# Patient Record
Sex: Female | Born: 1940
Health system: Southern US, Community
[De-identification: ages and names within clinical notes are randomized; demographics above are authoritative.]

## PROBLEM LIST (undated history)

## (undated) DIAGNOSIS — E785 Hyperlipidemia, unspecified: Secondary | ICD-10-CM

## (undated) DIAGNOSIS — Z8679 Personal history of other diseases of the circulatory system: Secondary | ICD-10-CM

## (undated) DIAGNOSIS — I10 Essential (primary) hypertension: Secondary | ICD-10-CM

## (undated) DIAGNOSIS — M810 Age-related osteoporosis without current pathological fracture: Secondary | ICD-10-CM

## (undated) DIAGNOSIS — K8020 Calculus of gallbladder without cholecystitis without obstruction: Secondary | ICD-10-CM

## (undated) DIAGNOSIS — C801 Malignant (primary) neoplasm, unspecified: Secondary | ICD-10-CM

## (undated) DIAGNOSIS — K5730 Diverticulosis of large intestine without perforation or abscess without bleeding: Secondary | ICD-10-CM

## (undated) DIAGNOSIS — Z85828 Personal history of other malignant neoplasm of skin: Secondary | ICD-10-CM

## (undated) DIAGNOSIS — C4490 Unspecified malignant neoplasm of skin, unspecified: Secondary | ICD-10-CM

## (undated) DIAGNOSIS — K219 Gastro-esophageal reflux disease without esophagitis: Secondary | ICD-10-CM

## (undated) DIAGNOSIS — IMO0001 Reserved for inherently not codable concepts without codable children: Secondary | ICD-10-CM

## (undated) DIAGNOSIS — M1990 Unspecified osteoarthritis, unspecified site: Secondary | ICD-10-CM

## (undated) DIAGNOSIS — Z9889 Other specified postprocedural states: Secondary | ICD-10-CM

## (undated) DIAGNOSIS — E041 Nontoxic single thyroid nodule: Secondary | ICD-10-CM

## (undated) HISTORY — DX: Essential (primary) hypertension: I10

## (undated) HISTORY — DX: Unspecified malignant neoplasm of skin, unspecified: C44.90

## (undated) HISTORY — DX: Gastro-esophageal reflux disease without esophagitis: K21.9

## (undated) HISTORY — DX: Calculus of gallbladder without cholecystitis without obstruction: K80.20

## (undated) HISTORY — DX: Unspecified osteoarthritis, unspecified site: M19.90

## (undated) HISTORY — DX: Reserved for inherently not codable concepts without codable children: IMO0001

## (undated) HISTORY — DX: Diverticulosis of large intestine without perforation or abscess without bleeding: K57.30

## (undated) HISTORY — PX: FOOT SURGERY: SHX648

## (undated) HISTORY — DX: Personal history of other diseases of the circulatory system: Z86.79

## (undated) HISTORY — DX: Hyperlipidemia, unspecified: E78.5

## (undated) HISTORY — PX: MOHS SURGERY: SUR867

## (undated) HISTORY — DX: Personal history of other malignant neoplasm of skin: Z85.828

## (undated) HISTORY — DX: Other specified postprocedural states: Z98.890

## (undated) HISTORY — DX: Age-related osteoporosis without current pathological fracture: M81.0

## (undated) HISTORY — DX: Malignant (primary) neoplasm, unspecified: C80.1

---

## 1982-06-27 HISTORY — PX: VARICOSE VEIN SURGERY: SHX832

## 1999-06-28 HISTORY — PX: CHOLECYSTECTOMY: SHX55

## 1999-10-20 ENCOUNTER — Encounter: Payer: Self-pay | Admitting: Internal Medicine

## 2002-06-27 HISTORY — PX: VARICOSE VEIN SURGERY: SHX832

## 2004-01-26 ENCOUNTER — Encounter: Payer: Self-pay | Admitting: Family Medicine

## 2004-01-26 LAB — CONVERTED CEMR LAB

## 2004-02-06 ENCOUNTER — Encounter: Admission: RE | Admit: 2004-02-06 | Discharge: 2004-02-06 | Payer: Self-pay | Admitting: Internal Medicine

## 2004-04-27 LAB — HM COLONOSCOPY

## 2004-05-12 ENCOUNTER — Encounter: Payer: Self-pay | Admitting: Internal Medicine

## 2004-05-12 ENCOUNTER — Ambulatory Visit (HOSPITAL_COMMUNITY): Admission: RE | Admit: 2004-05-12 | Discharge: 2004-05-12 | Payer: Self-pay | Admitting: Gastroenterology

## 2005-03-27 LAB — FECAL OCCULT BLOOD, GUAIAC

## 2005-09-12 ENCOUNTER — Encounter: Admission: RE | Admit: 2005-09-12 | Discharge: 2005-09-12 | Payer: Self-pay | Admitting: Internal Medicine

## 2006-01-25 ENCOUNTER — Ambulatory Visit: Payer: Self-pay | Admitting: Internal Medicine

## 2006-01-25 DIAGNOSIS — IMO0001 Reserved for inherently not codable concepts without codable children: Secondary | ICD-10-CM

## 2006-01-25 HISTORY — DX: Reserved for inherently not codable concepts without codable children: IMO0001

## 2006-01-27 ENCOUNTER — Ambulatory Visit: Payer: Self-pay

## 2006-01-27 ENCOUNTER — Ambulatory Visit: Payer: Self-pay | Admitting: Internal Medicine

## 2006-01-27 ENCOUNTER — Encounter: Payer: Self-pay | Admitting: Cardiology

## 2006-03-01 ENCOUNTER — Ambulatory Visit: Payer: Self-pay | Admitting: Internal Medicine

## 2006-04-07 ENCOUNTER — Ambulatory Visit: Payer: Self-pay | Admitting: Internal Medicine

## 2006-07-30 DIAGNOSIS — Z8679 Personal history of other diseases of the circulatory system: Secondary | ICD-10-CM

## 2006-07-30 HISTORY — DX: Personal history of other diseases of the circulatory system: Z86.79

## 2006-08-31 ENCOUNTER — Ambulatory Visit: Payer: Self-pay | Admitting: Internal Medicine

## 2006-08-31 LAB — CONVERTED CEMR LAB
Albumin: 3.7 g/dL (ref 3.5–5.2)
BUN: 14 mg/dL (ref 6–23)
CO2: 29 meq/L (ref 19–32)
Calcium: 9.3 mg/dL (ref 8.4–10.5)
Chloride: 108 meq/L (ref 96–112)
Cholesterol: 182 mg/dL (ref 0–200)
Creatinine, Ser: 1 mg/dL (ref 0.4–1.2)
GFR calc Af Amer: 72 mL/min
GFR calc non Af Amer: 59 mL/min
Glucose, Bld: 83 mg/dL (ref 70–99)
HDL: 43.7 mg/dL (ref >39.0)
LDL Cholesterol: 123 mg/dL — ABNORMAL HIGH (ref 0–99)
Phosphorus: 4.3 mg/dL (ref 2.3–4.6)
Potassium: 3.7 meq/L (ref 3.5–5.1)
Sodium: 144 meq/L (ref 135–145)
Total CHOL/HDL Ratio: 4.2
Triglycerides: 75 mg/dL (ref 0–149)
VLDL: 15 mg/dL (ref 0–40)

## 2006-09-14 ENCOUNTER — Encounter: Admission: RE | Admit: 2006-09-14 | Discharge: 2006-09-14 | Payer: Self-pay | Admitting: Internal Medicine

## 2006-11-09 ENCOUNTER — Encounter: Payer: Self-pay | Admitting: Internal Medicine

## 2007-02-27 ENCOUNTER — Encounter: Payer: Self-pay | Admitting: Family Medicine

## 2007-02-27 DIAGNOSIS — Z85828 Personal history of other malignant neoplasm of skin: Secondary | ICD-10-CM | POA: Insufficient documentation

## 2007-02-27 DIAGNOSIS — K5730 Diverticulosis of large intestine without perforation or abscess without bleeding: Secondary | ICD-10-CM | POA: Insufficient documentation

## 2007-02-27 DIAGNOSIS — M159 Polyosteoarthritis, unspecified: Secondary | ICD-10-CM | POA: Insufficient documentation

## 2007-02-27 DIAGNOSIS — I10 Essential (primary) hypertension: Secondary | ICD-10-CM | POA: Insufficient documentation

## 2007-03-05 ENCOUNTER — Ambulatory Visit: Payer: Self-pay | Admitting: Family Medicine

## 2007-03-05 ENCOUNTER — Other Ambulatory Visit: Admission: RE | Admit: 2007-03-05 | Discharge: 2007-03-05 | Payer: Self-pay | Admitting: Family Medicine

## 2007-03-05 ENCOUNTER — Encounter: Payer: Self-pay | Admitting: Family Medicine

## 2007-03-05 LAB — CONVERTED CEMR LAB: Pap Smear: NORMAL

## 2007-03-08 ENCOUNTER — Encounter (INDEPENDENT_AMBULATORY_CARE_PROVIDER_SITE_OTHER): Payer: Self-pay | Admitting: *Deleted

## 2007-09-17 ENCOUNTER — Encounter: Admission: RE | Admit: 2007-09-17 | Discharge: 2007-09-17 | Payer: Self-pay | Admitting: Internal Medicine

## 2007-09-19 ENCOUNTER — Encounter (INDEPENDENT_AMBULATORY_CARE_PROVIDER_SITE_OTHER): Payer: Self-pay | Admitting: *Deleted

## 2008-01-14 ENCOUNTER — Telehealth (INDEPENDENT_AMBULATORY_CARE_PROVIDER_SITE_OTHER): Payer: Self-pay | Admitting: *Deleted

## 2008-01-14 ENCOUNTER — Ambulatory Visit: Payer: Self-pay | Admitting: Internal Medicine

## 2008-04-09 ENCOUNTER — Telehealth: Payer: Self-pay | Admitting: Internal Medicine

## 2008-04-28 ENCOUNTER — Ambulatory Visit: Payer: Self-pay | Admitting: Internal Medicine

## 2008-04-30 LAB — CONVERTED CEMR LAB
Albumin: 3.7 g/dL (ref 3.5–5.2)
BUN: 14 mg/dL (ref 6–23)
Basophils Absolute: 0.1 10*3/uL (ref 0.0–0.1)
Basophils Relative: 1.1 % (ref 0.0–3.0)
CO2: 30 meq/L (ref 19–32)
Calcium: 9.1 mg/dL (ref 8.4–10.5)
Chloride: 106 meq/L (ref 96–112)
Creatinine, Ser: 0.9 mg/dL (ref 0.4–1.2)
Eosinophils Absolute: 0.4 10*3/uL (ref 0.0–0.7)
Eosinophils Relative: 4.3 % (ref 0.0–5.0)
GFR calc Af Amer: 80 mL/min
GFR calc non Af Amer: 66 mL/min
Glucose, Bld: 67 mg/dL — ABNORMAL LOW (ref 70–99)
HCT: 39 % (ref 36.0–46.0)
Hemoglobin: 13.4 g/dL (ref 12.0–15.0)
Lymphocytes Relative: 36.2 % (ref 12.0–46.0)
MCHC: 34.4 g/dL (ref 30.0–36.0)
MCV: 92.4 fL (ref 78.0–100.0)
Monocytes Absolute: 0.8 10*3/uL (ref 0.1–1.0)
Monocytes Relative: 9.7 % (ref 3.0–12.0)
Neutro Abs: 4.1 10*3/uL (ref 1.4–7.7)
Neutrophils Relative %: 48.7 % (ref 43.0–77.0)
Phosphorus: 4 mg/dL (ref 2.3–4.6)
Platelets: 221 10*3/uL (ref 150–400)
Potassium: 4.1 meq/L (ref 3.5–5.1)
RBC: 4.22 M/uL (ref 3.87–5.11)
RDW: 11.5 % (ref 11.5–14.6)
Sodium: 141 meq/L (ref 135–145)
TSH: 2.58 microintl units/mL (ref 0.35–5.50)
WBC: 8.5 10*3/uL (ref 4.5–10.5)

## 2008-09-17 ENCOUNTER — Encounter: Admission: RE | Admit: 2008-09-17 | Discharge: 2008-09-17 | Payer: Self-pay | Admitting: Internal Medicine

## 2008-09-17 LAB — HM MAMMOGRAPHY: HM Mammogram: NEGATIVE

## 2008-09-18 ENCOUNTER — Encounter: Payer: Self-pay | Admitting: Internal Medicine

## 2008-10-17 ENCOUNTER — Ambulatory Visit: Payer: Self-pay | Admitting: Internal Medicine

## 2008-10-17 DIAGNOSIS — M2550 Pain in unspecified joint: Secondary | ICD-10-CM | POA: Insufficient documentation

## 2008-10-17 DIAGNOSIS — K219 Gastro-esophageal reflux disease without esophagitis: Secondary | ICD-10-CM | POA: Insufficient documentation

## 2008-11-11 ENCOUNTER — Encounter: Payer: Self-pay | Admitting: Internal Medicine

## 2008-12-25 HISTORY — PX: MELANOMA EXCISION: SHX5266

## 2009-01-12 ENCOUNTER — Encounter: Payer: Self-pay | Admitting: Internal Medicine

## 2009-01-19 ENCOUNTER — Ambulatory Visit: Payer: Self-pay | Admitting: Internal Medicine

## 2009-01-27 ENCOUNTER — Encounter: Payer: Self-pay | Admitting: Internal Medicine

## 2009-02-04 ENCOUNTER — Encounter: Payer: Self-pay | Admitting: Internal Medicine

## 2009-02-09 ENCOUNTER — Encounter: Payer: Self-pay | Admitting: Internal Medicine

## 2009-05-20 ENCOUNTER — Ambulatory Visit: Payer: Self-pay | Admitting: Family Medicine

## 2009-06-01 ENCOUNTER — Ambulatory Visit: Payer: Self-pay | Admitting: Internal Medicine

## 2009-07-23 ENCOUNTER — Ambulatory Visit: Payer: Self-pay | Admitting: Internal Medicine

## 2009-07-23 DIAGNOSIS — R079 Chest pain, unspecified: Secondary | ICD-10-CM | POA: Insufficient documentation

## 2009-09-07 ENCOUNTER — Telehealth: Payer: Self-pay | Admitting: Internal Medicine

## 2009-11-06 ENCOUNTER — Ambulatory Visit: Payer: Self-pay | Admitting: Internal Medicine

## 2009-11-06 DIAGNOSIS — L255 Unspecified contact dermatitis due to plants, except food: Secondary | ICD-10-CM | POA: Insufficient documentation

## 2010-01-22 ENCOUNTER — Ambulatory Visit: Payer: Self-pay | Admitting: Internal Medicine

## 2010-02-02 ENCOUNTER — Telehealth: Payer: Self-pay | Admitting: Internal Medicine

## 2010-02-08 ENCOUNTER — Encounter: Payer: Self-pay | Admitting: Internal Medicine

## 2010-02-15 ENCOUNTER — Telehealth: Payer: Self-pay | Admitting: Internal Medicine

## 2010-07-12 ENCOUNTER — Other Ambulatory Visit: Payer: Self-pay | Admitting: Internal Medicine

## 2010-07-12 ENCOUNTER — Ambulatory Visit
Admission: RE | Admit: 2010-07-12 | Discharge: 2010-07-12 | Payer: Self-pay | Source: Home / Self Care | Attending: Internal Medicine | Admitting: Internal Medicine

## 2010-07-12 LAB — CBC WITH DIFFERENTIAL/PLATELET
Basophils Absolute: 0.1 10*3/uL (ref 0.0–0.1)
Basophils Relative: 0.8 % (ref 0.0–3.0)
Eosinophils Absolute: 0.3 10*3/uL (ref 0.0–0.7)
Eosinophils Relative: 3.9 % (ref 0.0–5.0)
HCT: 42.4 % (ref 36.0–46.0)
Hemoglobin: 14.3 g/dL (ref 12.0–15.0)
Lymphocytes Relative: 39.7 % (ref 12.0–46.0)
Lymphs Abs: 2.8 10*3/uL (ref 0.7–4.0)
MCHC: 33.8 g/dL (ref 30.0–36.0)
MCV: 92.8 fl (ref 78.0–100.0)
Monocytes Absolute: 0.6 10*3/uL (ref 0.1–1.0)
Monocytes Relative: 8.5 % (ref 3.0–12.0)
Neutro Abs: 3.4 10*3/uL (ref 1.4–7.7)
Neutrophils Relative %: 47.1 % (ref 43.0–77.0)
Platelets: 250 10*3/uL (ref 150.0–400.0)
RBC: 4.57 Mil/uL (ref 3.87–5.11)
RDW: 11.9 % (ref 11.5–14.6)
WBC: 7.2 10*3/uL (ref 4.5–10.5)

## 2010-07-12 LAB — RENAL FUNCTION PANEL
Albumin: 4.3 g/dL (ref 3.5–5.2)
BUN: 17 mg/dL (ref 6–23)
CO2: 28 mEq/L (ref 19–32)
Calcium: 9.4 mg/dL (ref 8.4–10.5)
Chloride: 102 mEq/L (ref 96–112)
Creatinine, Ser: 0.9 mg/dL (ref 0.4–1.2)
GFR: 69.37 mL/min (ref >60.00)
Glucose, Bld: 85 mg/dL (ref 70–99)
Phosphorus: 3.9 mg/dL (ref 2.3–4.6)
Potassium: 4 mEq/L (ref 3.5–5.1)
Sodium: 139 mEq/L (ref 135–145)

## 2010-07-12 LAB — HEPATIC FUNCTION PANEL
ALT: 24 U/L (ref 0–35)
AST: 20 U/L (ref 0–37)
Albumin: 4.3 g/dL (ref 3.5–5.2)
Alkaline Phosphatase: 66 U/L (ref 39–117)
Bilirubin, Direct: 0.1 mg/dL (ref 0.0–0.3)
Total Bilirubin: 0.5 mg/dL (ref 0.3–1.2)
Total Protein: 6.9 g/dL (ref 6.0–8.3)

## 2010-07-12 LAB — TSH: TSH: 2.26 u[IU]/mL (ref 0.35–5.50)

## 2010-07-21 ENCOUNTER — Other Ambulatory Visit: Payer: Self-pay | Admitting: Dermatology

## 2010-07-25 LAB — CONVERTED CEMR LAB
ALT: 24 units/L (ref 0–35)
ALT: 26 units/L (ref 0–35)
AST: 22 units/L (ref 0–37)
AST: 23 units/L (ref 0–37)
Albumin: 3.8 g/dL (ref 3.5–5.2)
Albumin: 4 g/dL (ref 3.5–5.2)
Alkaline Phosphatase: 71 units/L (ref 39–117)
Alkaline Phosphatase: 71 units/L (ref 39–117)
BUN: 13 mg/dL (ref 6–23)
BUN: 17 mg/dL (ref 6–23)
Basophils Absolute: 0 10*3/uL (ref 0.0–0.1)
Basophils Absolute: 0.1 10*3/uL (ref 0.0–0.1)
Basophils Relative: 0.4 % (ref 0.0–3.0)
Basophils Relative: 1.1 % (ref 0.0–3.0)
Bilirubin, Direct: 0 mg/dL (ref 0.0–0.3)
Bilirubin, Direct: 0.1 mg/dL (ref 0.0–0.3)
CO2: 29 meq/L (ref 19–32)
CO2: 30 meq/L (ref 19–32)
Calcium: 9 mg/dL (ref 8.4–10.5)
Calcium: 9.1 mg/dL (ref 8.4–10.5)
Chloride: 106 meq/L (ref 96–112)
Chloride: 106 meq/L (ref 96–112)
Cholesterol: 192 mg/dL (ref 0–200)
Creatinine, Ser: 0.8 mg/dL (ref 0.4–1.2)
Creatinine, Ser: 0.9 mg/dL (ref 0.4–1.2)
Eosinophils Absolute: 0.3 10*3/uL (ref 0.0–0.7)
Eosinophils Absolute: 0.4 10*3/uL (ref 0.0–0.7)
Eosinophils Relative: 4 % (ref 0.0–5.0)
Eosinophils Relative: 4.2 % (ref 0.0–5.0)
GFR calc non Af Amer: 66.01 mL/min (ref >60)
Glucose, Bld: 113 mg/dL — ABNORMAL HIGH (ref 70–99)
Glucose, Bld: 95 mg/dL (ref 70–99)
HCT: 39.4 % (ref 36.0–46.0)
HCT: 40.6 % (ref 36.0–46.0)
HDL: 38.6 mg/dL — ABNORMAL LOW (ref >39.00)
Hemoglobin: 13.5 g/dL (ref 12.0–15.0)
Hemoglobin: 13.9 g/dL (ref 12.0–15.0)
LDL Cholesterol: 118 mg/dL — ABNORMAL HIGH (ref 0–99)
Lymphocytes Relative: 27 % (ref 12.0–46.0)
Lymphocytes Relative: 30.6 % (ref 12.0–46.0)
Lymphs Abs: 2.3 10*3/uL (ref 0.7–4.0)
Lymphs Abs: 2.5 10*3/uL (ref 0.7–4.0)
MCHC: 34.1 g/dL (ref 30.0–36.0)
MCHC: 34.2 g/dL (ref 30.0–36.0)
MCV: 91.2 fL (ref 78.0–100.0)
MCV: 91.8 fL (ref 78.0–100.0)
Monocytes Absolute: 0.4 10*3/uL (ref 0.1–1.0)
Monocytes Absolute: 0.8 10*3/uL (ref 0.1–1.0)
Monocytes Relative: 10.1 % (ref 3.0–12.0)
Monocytes Relative: 4.6 % (ref 3.0–12.0)
Neutro Abs: 4.2 10*3/uL (ref 1.4–7.7)
Neutro Abs: 5.7 10*3/uL (ref 1.4–7.7)
Neutrophils Relative %: 54.9 % (ref 43.0–77.0)
Neutrophils Relative %: 63.1 % (ref 43.0–77.0)
Phosphorus: 3.8 mg/dL (ref 2.3–4.6)
Phosphorus: 4 mg/dL (ref 2.3–4.6)
Platelets: 210 10*3/uL (ref 150.0–400.0)
Platelets: 225 10*3/uL (ref 150.0–400.0)
Potassium: 3.8 meq/L (ref 3.5–5.1)
Potassium: 3.9 meq/L (ref 3.5–5.1)
RBC: 4.32 M/uL (ref 3.87–5.11)
RBC: 4.43 M/uL (ref 3.87–5.11)
RDW: 11.7 % (ref 11.5–14.6)
RDW: 12.1 % (ref 11.5–14.6)
Sodium: 142 meq/L (ref 135–145)
Sodium: 142 meq/L (ref 135–145)
TSH: 1.65 microintl units/mL (ref 0.35–5.50)
TSH: 1.71 microintl units/mL (ref 0.35–5.50)
Total Bilirubin: 0.6 mg/dL (ref 0.3–1.2)
Total Bilirubin: 0.7 mg/dL (ref 0.3–1.2)
Total CHOL/HDL Ratio: 5
Total Protein: 6.3 g/dL (ref 6.0–8.3)
Total Protein: 6.7 g/dL (ref 6.0–8.3)
Triglycerides: 176 mg/dL — ABNORMAL HIGH (ref 0.0–149.0)
VLDL: 35.2 mg/dL (ref 0.0–40.0)
WBC: 7.6 10*3/uL (ref 4.5–10.5)
WBC: 9.1 10*3/uL (ref 4.5–10.5)

## 2010-07-28 NOTE — Progress Notes (Signed)
Summary: Rx Lisinopril  Phone Note Refill Request Call back at [phone] Message from:  [facility] on April 09, 2008 8:36 AM  Refills Requested: Medication #1:  LISINOPRIL 10 MG  TABS 1 by mouth once daily Received faxed form to sign and fax back.  Form is in your in box.   Method Requested: Fax to Mail Away Pharmacy Initial call taken by: Sydell Axon,  April 09, 2008 8:36 AM  Follow-up for Phone Call        Rx done needs appt within the next few months to review meds Follow-up by: Cindee Salt MD,  April 09, 2008 1:54 PM  Additional Follow-up for Phone Call Additional follow up Details #1::        Rx faxed to pharmacy.  Pt notified by telephone as instructed.  Pt stated that she will call back and schedule an appt. Additional Follow-up by: Sydell Axon,  April 09, 2008 3:06 PM      Prescriptions: LISINOPRIL 10 MG  TABS (LISINOPRIL) 1 by mouth once daily  #90 x 3   Entered by:   Sydell Axon   Authorized by:   Cindee Salt MD   Signed by:   Sydell Axon on 04/09/2008   Method used:   Handwritten   RxID:   [id number]

## 2010-07-28 NOTE — Progress Notes (Signed)
Summary: refill request for omeprazole  Phone Note Refill Request Message from:  Fax from Pharmacy  Refills Requested: Medication #1:  omeprazole 20 mg [facility] is on your desk, this is no longer on med list.  Initial call taken by: Lowella Petties CMA,  September 07, 2009 1:17 PM  Follow-up for Phone Call        taken off as ineffective Please check with her If she is back on it, okay to refill for 1 year Follow-up by: Cindee Salt MD,  September 07, 2009 1:31 PM  Additional Follow-up for Phone Call Additional follow up Details #1::        pt was switched on 07/23/2009 from omeprazole to protonix. left message on machine for patient to return call.  DeShannon Smith CMA Duncan Dull)  September 07, 2009 2:45 PM   left message on machine for patient to return call.  DeShannon Smith CMA (AAMA)  September 08, 2009 8:20 AM   pt called back and stated she is taking protonix.  Additional Follow-up by: Mervin Hack CMA Duncan Dull),  September 09, 2009 2:26 PM

## 2010-07-28 NOTE — Assessment & Plan Note (Signed)
Summary: 6 MONTH FOLLWO UP/[facility]   Vital Signs:  Patient profile:   70 year old female Weight:      125.13 pounds Temp:     98.3 degrees F oral Pulse rate:   60 / minute Pulse rhythm:   regular BP sitting:   142 / 80  (left arm) Cuff size:   regular  Vitals Entered By: Janee Morn CMA (January 22, 2010 10:35 AM) CC: 6 month follow up   History of Present Illness: Doing okay  Having pain in right big toe wonders about gout several months of symptoms Gets brief shooting spells and feels spasms  Monitors BP usual 140/80 or so No headaches No chest pain No SOB No regular exercise  Still with heartburn has been on omeprazole but still with symptoms  Having nodules on fingers New one on right 5th PIP is new hasn't generally used meds--occ tylenol  Allergies: 1)  ! * N-Saids 2)  ! Aspirin Buffered 3)  ! Codeine  Past History:  Past medical, surgical, family and social histories (including risk factors) reviewed for relevance to current acute and chronic problems.  Past Medical History: Reviewed history from 07/23/2009 and no changes required. Skin cancer, hx of Diverticulosis, colon Hypertension Osteoarthritis GERD  Past Surgical History: Reviewed history from 06/01/2009 and no changes required. 1984      Vein stripping bilat. 2001      Cholecystectomy 2004      Vein stripping, right foot 8/07        Echo/Stress Echo nl 5/98        Echo - fairly nl. 2-3/08     Sclerotherapy. - left leg Donia Ast) 7/10        Melanoma on leg  Family History: Reviewed history from 01/19/2009 and no changes required. Father: Died, ETOH, DM Mother: Died ETOH, colon CA. Had osteoporosis Siblings: 1 brother died of lung CA CV:  2 distant HBP: No sign. H/A DM:  (from ETOH) Colon CA:  Mom See Geneology Arthritis in aunts on both sides  Social History: Reviewed history from 02/27/2007 and no changes required. 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   Neck:  supple, no masses, no thyromegaly, no carotid bruits, and no cervical lymphadenopathy.   Lungs:  normal respiratory effort, no intercostal retractions, no accessory muscle use, and normal breath sounds.   Heart:  normal rate, regular rhythm, no murmur, and no gallop.   Abdomen:  soft, non-tender, and no masses.   Msk:  normal ROM and no joint tenderness.   No inflammation or sig tenderness at right 1st MTP (probably mechanical) nodules on some hand PIP/DIPs Pulses:  1+ in feet Extremities:  no edema Neurologic:  alert & oriented X3, strength normal in all extremities, and gait normal.   Skin:  no rashes and no suspicious lesions.   Psych:  normally interactive, good eye contact, not anxious appearing, and not depressed appearing.     Impression & Recommendations:  Problem # 1:  HYPERTENSION (ICD-401.9) Assessment Unchanged reasonable control no changes needed  Her updated medication list for this problem includes:    Lisinopril 10 Mg Tabs (Lisinopril) .Marland Kitchen... 1 by mouth once daily  BP today: 142/80 Prior BP: 129/78 (11/06/2009)  Prior 10 Yr Risk Heart Disease: 17 % (03/05/2007)  Labs Reviewed: K+: 3.9 (07/23/2009) Creat: : 0.9 (07/23/2009)   Chol: 192 (07/23/2009)   HDL: 38.60 (07/23/2009)   LDL: 118 (07/23/2009)   TG: 176.0 (07/23/2009)  Problem # 2:  GERD (ICD-530.81) Assessment: Comment Only she will now try the pantoprazole since she does still have some symptoms on current med  The following medications were removed from the medication list:    Omeprazole 20 Mg Cpdr (Omeprazole) .Marland Kitchen... 1 tab daily for heartburn Her updated medication list for this problem includes:    Pantoprazole Sodium 40 Mg Tbec (Pantoprazole  sodium) .Marland Kitchen... 1 tab by mouth two times a day for reflux esophagitis  Problem # 3:  OSTEOARTHRITIS (ICD-715.90) Assessment: Unchanged will use tylenol as needed  foot is mechanical ---discussed shoes  Problem # 4:  Screening Breast Cancer (ICD-V76.10) Assessment: Comment Only wants to do mammos every 2 years  Complete Medication List: 1)  Pantoprazole Sodium 40 Mg Tbec (Pantoprazole sodium) .Marland Kitchen.. 1 tab by mouth two times a day for reflux esophagitis 2)  Lisinopril 10 Mg Tabs (Lisinopril) .Marland Kitchen.. 1 by mouth once daily 3)  Multivitamins Tabs (Multiple vitamin) .... Once daily 4)  Caltrate 600+d 600-400 Mg-unit Tabs (Calcium carbonate-vitamin d) .... Two tablets daily 5)  Calcium Magnesium 600-100-300 Liqd (Calcium-magnesium-vitamin d) .... Take 2 by mouth once daily  Patient Instructions: 1)  Please schedule a follow-up appointment in 6 months .  Prescriptions: PANTOPRAZOLE SODIUM 40 MG TBEC (PANTOPRAZOLE SODIUM) 1 tab by mouth two times a day for reflux esophagitis  #180 x 3   Entered and Authorized by:   Cindee Salt MD   Signed by:   Cindee Salt MD on 01/22/2010   Method used:   Print then Give to Patient   RxID:   [id number] OMEPRAZOLE 20 MG CPDR (OMEPRAZOLE) 1 tab daily for heartburn  #90 x 3   Entered and Authorized by:   Cindee Salt MD   Signed by:   Cindee Salt MD on 01/22/2010   Method used:   Print then Give to Patient   RxID:   [id number]   Current Allergies (reviewed today): ! * N-SAIDS ! ASPIRIN BUFFERED ! CODEINE

## 2010-07-28 NOTE — Assessment & Plan Note (Signed)
Summary: 6 MO. F/U/BIR   Vital Signs:  Patient profile:   70 year old female Weight:      128 pounds Temp:     98.4 degrees F oral Pulse rate:   60 / minute Pulse rhythm:   regular BP sitting:   140 / 70  (left arm) Cuff size:   regular  Vitals Entered By: Mervin Hack CMA Duncan Dull) (July 23, 2009 2:19 PM) CC: 6 month follow-up   History of Present Illness: doing well Rash is better  Checks BP on her own machine Generally  ~140 systolic No sig headaches  Occ intense chest pain--very brief (within 5 minutes) Not related to eating or exertion Gets SOB No much activity--walks some in nicer weather  Still with regular heartburn better with omeprazole two times a day  Bowels fine appetite is good  Still with painful hands No meds except tylenol--not much help  Allergies: 1)  ! * N-Saids 2)  ! Aspirin Buffered 3)  ! Codeine  Past History:  Past medical, surgical, family and social histories (including risk factors) reviewed for relevance to current acute and chronic problems.  Past Medical History: Skin cancer, hx of Diverticulosis, colon Hypertension Osteoarthritis GERD  Past Surgical History: Reviewed history from 06/01/2009 and no changes required. 1984      Vein stripping bilat. 2001      Cholecystectomy 2004      Vein stripping, right foot 8/07        Echo/Stress Echo nl 5/98        Echo - fairly nl. 2-3/08     Sclerotherapy. - left leg Donia Ast) 7/10        Melanoma on leg  Family History: Reviewed history from 01/19/2009 and no changes required. Father: Died, ETOH, DM Mother: Died ETOH, colon CA. Had osteoporosis Siblings: 1 brother died of lung CA CV:  2 distant HBP: No sign. H/A DM:  (from ETOH) Colon CA:  Mom See Geneology Arthritis in aunts on both sides  Social History: Reviewed history from 02/27/2007 and no changes required. 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   Neck:  supple, no masses, no thyromegaly, no carotid bruits, and no cervical lymphadenopathy.   Lungs:  normal respiratory effort and normal breath sounds.   Heart:  normal rate, regular rhythm, no murmur, and no gallop.   Abdomen:  soft and non-tender.   Extremities:  no edema Psych:  normally interactive, good eye contact, not anxious appearing, and not depressed appearing.     Impression & Recommendations:  Problem # 1:  CHEST PAIN (ICD-786.50) Assessment New  doesn't sound like ischemia probably GI will change PPI try mylanta  Orders: EKG w/ Interpretation (93000) TLB-Lipid Panel (80061-LIPID) TLB-Renal Function Panel (80069-RENAL) TLB-CBC Platelet - w/Differential (85025-CBCD) TLB-Hepatic/Liver Function Pnl (80076-HEPATIC) TLB-TSH (Thyroid Stimulating Hormone) (84443-TSH) Venipuncture (04540)  Problem # 2:  GERD (ICD-530.81) Assessment: Deteriorated ongoing symptoms omeprazole failure will try pantoprazole  The following medications were removed from the medication list:    Omeprazole 20 Mg Cpdr (Omeprazole) .Marland Kitchen... 1 two times a day for acid symptoms Her updated medication list for this problem includes:    Pantoprazole Sodium 40 Mg Tbec (Pantoprazole sodium) .Marland Kitchen... 1 tab by mouth two times a day for  reflux esophagitis  Problem # 3:  HYPERTENSION (ICD-401.9) Assessment: Unchanged good control no changes needed  Her updated medication list for this problem includes:    Lisinopril 10 Mg Tabs (Lisinopril) .Marland Kitchen... 1 by mouth once daily  BP today: 140/70 Prior BP: 140/80 (06/01/2009)  Prior 10 Yr Risk Heart Disease: 17 % (03/05/2007)  Labs Reviewed: K+: 3.8 (01/19/2009) Creat: : 0.8 (01/19/2009)   Chol: 182 (08/31/2006)   HDL: 43.7 (08/31/2006)   LDL: 123  (08/31/2006)   TG: 75 (08/31/2006)  Complete Medication List: 1)  Lisinopril 10 Mg Tabs (Lisinopril) .Marland Kitchen.. 1 by mouth once daily 2)  Multivitamins Tabs (Multiple vitamin) .... Once daily 3)  Caltrate 600+d 600-400 Mg-unit Tabs (Calcium carbonate-vitamin d) .... Two tablets daily 4)  Calcium Magnesium 600-100-300 Liqd (Calcium-magnesium-vitamin d) .... Take 2 by mouth once daily 5)  Fish Oil 1000 Mg Caps (Omega-3 fatty acids) .... Take 1 by mouth two times a day 6)  Pantoprazole Sodium 40 Mg Tbec (Pantoprazole sodium) .Marland Kitchen.. 1 tab by mouth two times a day for reflux esophagitis  Patient Instructions: 1)  Please schedule a follow-up appointment in 6 months .  Prescriptions: PANTOPRAZOLE SODIUM 40 MG TBEC (PANTOPRAZOLE SODIUM) 1 tab by mouth two times a day for reflux esophagitis  #180 x 0   Entered and Authorized by:   Cindee Salt MD   Signed by:   Cindee Salt MD on 07/23/2009   Method used:   Print then Give to Patient   RxID:   [id number]   Current Allergies (reviewed today): ! * N-SAIDS ! ASPIRIN BUFFERED ! CODEINE   EKG  Procedure date:  07/23/2009  Findings:      sinus bradycardia @55  Nonspecific T changes

## 2010-07-28 NOTE — Assessment & Plan Note (Signed)
Summary: 8:45 INDIGESTION,ARTHRITIS HANDS/CLE   Vital Signs:  Patient profile:   70 year old female Height:      61 inches Weight:      125 pounds BMI:     23.70 Temp:     98.3 degrees F oral Pulse rate:   68 / minute Pulse rhythm:   regular BP sitting:   138 / 80  (left arm) Cuff size:   regular  Vitals Entered By: Mervin Hack CMA (October 17, 2008 8:45 AM)  History of Present Illness: CC: arthritis  "arthritis is just killing me" Had nodules in palms Fingers freeze up Left thumb is the worst wonders about seeing rheumatologist hasn't tried any meds except occ excedrin or tylenol---no clear help  Right knee "grinds" when she goes up stairs some burning in soles of feet when she walks   having bad indigestion gets sense of throat being on fire at night taking 2 pepcid in the morning--helps some No swallowing problems eating well going on for several months but worsening now  has constant clicking in left ear at night for past 2 weeks No hearing problems no tinnitus no vertigo  Allergies: 1)  ! * N-Saids 2)  ! Aspirin Buffered  Past History:  Past medical, surgical, family and social histories (including risk factors) reviewed for relevance to current acute and chronic problems.  Past Medical History:    Reviewed history from 02/27/2007 and no changes required:    Skin cancer, hx of    Diverticulosis, colon    Hypertension    Osteoarthritis  Past Surgical History:    Reviewed history from 04/28/2008 and no changes required:    1984      Vein stripping bilat.    2001      Cholecystectomy    2004      Vein stripping, right foot    8/07        Echo/Stress Echo nl    5/98        Echo - fairly nl.    2-3/08     Sclerotherapy. - left leg Donia Ast)  Family History:    Father: Died, ETOH, DM    Mother: Died ETOH, colon CA    Siblings: 1 brother died of lung CA    CV:  2 distant    HBP: No sign. 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         No sig cough Bowels are okay  Physical Exam  General:  alert and normal appearance.   Ears:  R ear normal and L ear normal.   Mouth:  no erythema and no lesions.   Neck:  supple, no masses, and no thyromegaly.   Lungs:  normal respiratory effort and normal breath sounds.   Heart:  normal rate,  regular rhythm, no murmur, and no gallop.   Abdomen:  soft and non-tender.   Msk:  nodular disease in all PIP and DIP joints CMC joints are mildly tender and limited thumb mobility Knees have no synovitis  Extremities:  no edema Neurologic:  alert & oriented X3.   Limited grip strength Psych:  normally interactive, good eye contact, not anxious appearing, and not depressed appearing.     Impression & Recommendations:  Problem # 1:  ARTHRALGIA (ICD-719.40) Assessment Deteriorated  findings are likely osteoarthritis but I think RA is possible will set up with rheumatologist trial of meloxicam  Orders: Rheumatology Referral (Rheumatology)  Problem # 2:  GERD (ICD-530.81) Assessment: New  clear cut symptoms will have her try omeprazole two times a day and cut down to daily if symptoms improve  Her updated medication list for this problem includes:    Omeprazole 20 Mg Cpdr (Omeprazole) .Marland Kitchen... 1 two times a day for acid symptoms  Problem # 3:  UNSPECIFIED NOISE EFFECTS ON INNER EAR (ICD-388.10) Assessment: New clicking not pulse like sounds benign so we will just observe  Complete Medication List: 1)  Lisinopril 10 Mg Tabs  (Lisinopril) .Marland Kitchen.. 1 by mouth once daily 2)  Multivitamins Tabs (Multiple vitamin) .... Once daily 3)  Caltrate 600+d 600-400 Mg-unit Tabs (Calcium carbonate-vitamin d) .... Once daily 4)  Meloxicam 15 Mg Tabs (Meloxicam) .Marland Kitchen.. 1 daily for arthritis pain 5)  Omeprazole 20 Mg Cpdr (Omeprazole) .Marland Kitchen.. 1 two times a day for acid symptoms  Patient Instructions: 1)  Take the omeprazole about 30 minutes before breakfast and before bedtime. If your symptoms go away, you can try cutting down to just one a day 2)  Keep regular appointment 3)  Referral Appointment Information 4)  Day/Date: 5)  Time: 6)  Place/MD: 7)  Address: 8)  Phone/Fax: 9)  Patient given appointment information. Information/Orders faxed/mailed. Prescriptions: OMEPRAZOLE 20 MG CPDR (OMEPRAZOLE) 1 two times a day for acid symptoms  #180 x 3   Entered and Authorized by:   Cindee Salt MD   Signed by:   Cindee Salt MD on 10/17/2008   Method used:   Print then Give to Patient   RxID:   [id number] MELOXICAM 15 MG TABS (MELOXICAM) 1 daily for arthritis pain  #90 x 3   Entered and Authorized by:   Cindee Salt MD   Signed by:   Cindee Salt MD on 10/17/2008   Method used:   Electronically to        [facility]  [address] (retail)       [address]       [place], [place]  [address]       Ph: [phone]       Fax: [phone]   RxID:   [id number]       Current Allergies (reviewed today): ! * N-SAIDS ! ASPIRIN BUFFERED

## 2010-07-28 NOTE — Progress Notes (Signed)
Summary: poison ivy  Phone Note Call from Patient Call back at Home Phone [phone]   Caller: Patient Call For: letvak Summary of Call: pt has a bad case of poison ivy, is asking for prednisone [facility]  [phone] Initial call taken by: Lowella Petties,  January 14, 2008 9:13 AM  Follow-up for Phone Call        can't phone in prednsone without an evaluation can put on at the end of the day if she would like Follow-up by: Cindee Salt MD,  January 14, 2008 10:57 AM  Additional Follow-up for Phone Call Additional follow up Details #1::        pt notified and making an appointment Additional Follow-up by: Silas [place],  January 14, 2008 11:08 AM

## 2010-07-28 NOTE — Assessment & Plan Note (Signed)
Summary: POISON IVY   Vital Signs:  Patient profile:   70 year old female Weight:      127 pounds Temp:     98.3 degrees F oral BP sitting:   129 / 78  (left arm) Cuff size:   regular  Vitals Entered By: Mervin Hack CMA Duncan Dull) (Nov 06, 2009 8:50 AM) CC: rash   History of Present Illness: Out in yard, weeding and clearing limbs, etc  out awhile started wtih rash 2 days ago lesion of face, chin and finger  Very itchy tried lanacane---not much help  Allergies: 1)  ! * N-Saids 2)  ! Aspirin Buffered 3)  ! Codeine  Past History:  Past medical, surgical, family and social histories (including risk factors) reviewed for relevance to current acute and chronic problems.  Past Medical History: Reviewed history from 07/23/2009 and no changes required. Skin cancer, hx of Diverticulosis, colon Hypertension Osteoarthritis GERD  Past Surgical History: Reviewed history from 06/01/2009 and no changes required. 1984      Vein stripping bilat. 2001      Cholecystectomy 2004      Vein stripping, right foot 8/07        Echo/Stress Echo nl 5/98        Echo - fairly nl. 2-3/08     Sclerotherapy. - left leg Donia Ast) 7/10        Melanoma on leg  Family History: Reviewed history from 01/19/2009 and no changes required. Father: Died, ETOH, DM Mother: Died ETOH, colon CA. Had osteoporosis Siblings: 1 brother died of lung CA CV:  2 distant HBP: No sign. H/A DM:  (from ETOH) Colon CA:  Mom See Geneology Arthritis in aunts on both sides  Social History: Reviewed history from 02/27/2007 and no changes required. 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   Skin:  characteristic papulovesicular lesions on right cheek,  chin, right hand   Impression & Recommendations:  Problem # 1:  CONTACT DERMATITIS&OTHER ECZEMA DUE TO PLANTS (ICD-692.6) Assessment New  she has had severe systemic reactions with all previous exposures will treat with prednisone  Her updated medication list for this problem includes:    Prednisone 20 Mg Tabs (Prednisone) .Marland Kitchen... 2 tabs daily for 1 week, then 1 tab daily for rash  Orders: Prescription Created Electronically [id number]  Complete Medication List: 1)  Pantoprazole Sodium 40 Mg Tbec (Pantoprazole sodium) .Marland Kitchen.. 1 tab by mouth two times a day for reflux esophagitis 2)  Lisinopril 10 Mg Tabs (Lisinopril) .Marland Kitchen.. 1 by mouth once daily 3)  Multivitamins Tabs (Multiple vitamin) .... Once daily 4)  Caltrate 600+d 600-400 Mg-unit Tabs (Calcium carbonate-vitamin d) .... Two tablets daily 5)  Calcium Magnesium 600-100-300 Liqd (Calcium-magnesium-vitamin d) .... Take 2 by mouth once daily 6)  Fish Oil 1000 Mg Caps (Omega-3 fatty acids) .... Take 1 by mouth two times a day 7)  Prednisone 20 Mg Tabs (Prednisone) .... 2 tabs daily for 1 week, then 1 tab daily for rash  Patient Instructions: 1)  Please schedule a follow-up appointment as needed  2)  Keep regular scheduled appointment Prescriptions: PREDNISONE 20 MG TABS (PREDNISONE) 2 tabs daily for 1 week, then 1 tab daily for rash  #21 x 0   Entered  and Authorized by:   Cindee Salt MD   Signed by:   Cindee Salt MD on 11/06/2009   Method used:   Electronically to        [facility]  [address] (retail)       [address]       [place], [place]  [address]       Ph: [phone] or [phone]       Fax: [phone]   RxID:   [id number]   Current Allergies (reviewed today): ! * N-SAIDS ! ASPIRIN BUFFERED ! CODEINE

## 2010-07-28 NOTE — Procedures (Signed)
Summary: Screening Colonoscopy/[facility]  Screening Colonoscopy/[facility]   Imported By: Maryln Gottron 01/21/2009 10:04:55  _____________________________________________________________________  External Attachment:    Type:   Image     Comment:   External Document

## 2010-07-28 NOTE — Assessment & Plan Note (Signed)
Summary: ITCHING ALL OVER/DLO   Vital Signs:  Patient profile:   70 year old female Weight:      126 pounds Temp:     98.3 degrees F oral BP sitting:   140 / 80  (left arm) Cuff size:   regular  Vitals Entered By: Mervin Hack CMA Duncan Dull) (June 01, 2009 10:43 AM) CC: itching   History of Present Illness: Having rash on arms and neck Bad itching started 4 days ago  doesn't know of any exposures different then what she had 2 weeks ago  Has tried benedryl--not really helping any  No prior problems no med changes   Allergies: 1)  ! * N-Saids 2)  ! Aspirin Buffered 3)  ! Codeine  Past History:  Past medical, surgical, family and social histories (including risk factors) reviewed for relevance to current acute and chronic problems.  Past Medical History: Reviewed history from 02/27/2007 and no changes required. Skin cancer, hx of Diverticulosis, colon Hypertension Osteoarthritis  Past Surgical History: 1984      Vein stripping bilat. 2001      Cholecystectomy 2004      Vein stripping, right foot 8/07        Echo/Stress Echo nl 5/98        Echo - fairly nl. 2-3/08     Sclerotherapy. - left leg Donia Ast) 7/10        Melanoma on leg  Family History: Reviewed history from 01/19/2009 and no changes required. Father: Died, ETOH, DM Mother: Died ETOH, colon CA. Had osteoporosis Siblings: 1 brother died of lung CA CV:  2 distant HBP: No sign. H/A DM:  (from ETOH) Colon CA:  Mom See Geneology Arthritis in aunts on both sides  Social History: Reviewed history from 02/27/2007 and no changes required. 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  NAD Skin:  papulovesicular rash on volar arms  R>L similar rash on left  neck/chin   Impression & Recommendations:  Problem # 1:  CONTACT DERMATITIS&OTHER ECZEMA DUE UNSPEC CAUSE (ICD-692.9) Assessment New classic appearance of contact derm but no apparent exposure will treat with predinisone could be a reactiviation of past exposure?  will use longer prednisone course and topical  The following medications were removed from the medication list:    Elocon 0.1 % Crea (Mometasone furoate) .Marland Kitchen... Apply thinnly two times a day to rash    Prednisone (pak) 10 Mg Tabs (Prednisone) .Marland Kitchen... Take as directed x 6days Her updated medication list for this problem includes:    Prednisone 10 Mg Tabs (Prednisone) .Marland KitchenMarland KitchenMarland KitchenMarland Kitchen 4 tabs daily for 4 days, then 3 daily for 4 days, 2 daily for 4 days, then 1 daily for 4 days  Complete Medication List: 1)  Lisinopril 10 Mg Tabs (Lisinopril) .Marland Kitchen.. 1 by mouth once daily 2)  Multivitamins Tabs (Multiple vitamin) .... Once daily 3)  Caltrate 600+d 600-400 Mg-unit Tabs (Calcium carbonate-vitamin d) .... Two tablets daily 4)  Omeprazole 20 Mg Cpdr (Omeprazole) .Marland Kitchen.. 1 two times a day for acid symptoms 5)  Calcium Magnesium 600-100-300 Liqd (Calcium-magnesium-vitamin d) .... Take 2 by mouth once daily 6)  Fish Oil 1000 Mg Caps (Omega-3 fatty acids) .... Take 1 by mouth two times a day 7)  Prednisone 10 Mg Tabs (  Prednisone) .... 4 tabs daily for 4 days, then 3 daily for 4 days, 2 daily for 4 days, then 1 daily for 4 days  Patient Instructions: 1)  Keep January appointment 2)  can continue the benedryl for itching and try the cream Prescriptions: PREDNISONE 10 MG TABS (PREDNISONE) 4 tabs daily for 4 days, then 3 daily for 4 days, 2 daily for 4 days, then 1 daily for 4 days  #40 x 0   Entered and Authorized by:   Cindee Salt MD   Signed by:   Cindee Salt MD on 06/01/2009   Method used:   [facility]        [facility]       [address]       [place], [place]  [address]       Ph: [phone] or  [phone]       Fax: [phone]   RxID:   [id number]   Current Allergies (reviewed today): ! * N-SAIDS ! ASPIRIN BUFFERED ! CODEINE

## 2010-07-28 NOTE — Progress Notes (Signed)
Summary: prior auth given for protonix  Phone Note From Pharmacy   Caller: [facility] Summary of Call: Insurance approved prior auth request for protonix, but pt was changed to nexium.  Approval letter is on your desk. Initial call taken by: Lowella Petties CMA,  February 15, 2010 8:55 AM  Follow-up for Phone Call        see which one she prefers Cindee Salt MD  February 15, 2010 10:13 AM   Spoke with pt, she just received nexium in the mail yesterday, so she is going to try this and see how it does.  She will let us know if it doesnt work as well for her. Follow-up by: Lowella Petties CMA,  February 16, 2010 8:22 AM

## 2010-07-28 NOTE — Assessment & Plan Note (Signed)
Summary: CPX/DLO   Vital Signs:  Patient profile:   70 year old female Weight:      127 pounds Temp:     98.6 degrees F oral Pulse rate:   60 / minute Pulse rhythm:   regular BP sitting:   140 / 62  (left arm) Cuff size:   regular  Vitals Entered By: Mervin Hack CMA (January 19, 2009 3:02 PM)  History of Present Illness: Chief Complaint: adult physical  discussed physical--will just do follow up  [facility] for routine dermatology eval Right calf lesion --had excisional biopsy Was melanoma---early stage 2 Now planning wide excision with Dr Alean Rinne Hopeful for cure   Wonders about DEXA Last done and normal in 2001 Does stay physically active takes calcium and vitamin D discussed--no need to repeat  Reviewed excellant chol profile no need to repeat  Doing fine on lisinopril checks BP occ generally 140's/80's No headaches  Gets occ chest pain--thinks it is indigestion but not related to eating Takes her breath away Runs up and down stairs a lot--not associated with exertion Lasts up to 5 mintues negative stress test 3 years ago  Does feel burning she used to get in bed is better on omeprazole   Allergies: 1)  ! * N-Saids 2)  ! Aspirin Buffered 3)  ! Codeine  Past History:  Past medical, surgical, family and social histories (including risk factors) reviewed for relevance to current acute and chronic problems.  Past Medical History: Reviewed history from 02/27/2007 and no changes required. Skin cancer, hx of Diverticulosis, colon Hypertension Osteoarthritis  Past Surgical History: Reviewed history from 04/28/2008 and no changes required. 1984      Vein stripping bilat. 2001      Cholecystectomy 2004      Vein stripping, right foot 8/07        Echo/Stress Echo nl 5/98        Echo - fairly nl. 2-3/08     Sclerotherapy. - left leg Donia Ast)  Family History: Father: Died, ETOH, DM Mother: Died ETOH, colon CA. Had osteoporosis Siblings: 1 brother died of  lung CA CV:  2 distant HBP: No sign. H/A DM:  (from ETOH) Colon CA:  Mom See Geneology Arthritis in aunts on both sides  Social History: Reviewed history from 02/27/2007 and no changes required. 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   Mouth:  no erythema, no exudates, and no lesions.   Neck:  supple, no masses, no thyromegaly, no carotid bruits, and no cervical lymphadenopathy.   Chest Wall:  no tenderness and no mass.   Lungs:  normal respiratory effort and normal breath sounds.   Heart:  normal rate, regular rhythm, no murmur, no gallop, and no rub.   Abdomen:  soft, non-tender, and no masses.   Msk:  no joint tenderness and no joint swelling.   Pulses:  2+ in feet Extremities:  no edema Neurologic:  alert & oriented X3 and strength normal in all extremities.   Psych:  normally interactive, good eye contact, not anxious appearing, and not depressed appearing.     Impression & Recommendations:  Problem # 1:  CHEST PAIN (ICD-786.50) Assessment New  sounds more like indigestion Not exertional, very atypical Stress test  fine and no change in EKG since then Most likely  observe only  Orders: EKG w/ Interpretation (93000) Venipuncture ([address]) TLB-Renal Function Panel (80069-RENAL) TLB-CBC Platelet - w/Differential (85025-CBCD) TLB-Hepatic/Liver Function Pnl (80076-HEPATIC) TLB-TSH (Thyroid Stimulating Hormone) (84443-TSH)  Problem # 2:  GERD (ICD-530.81) Assessment: Improved better with meds no burning at night anymore chest pain may be related  Her updated medication list for this problem includes:    Omeprazole 20 Mg Cpdr (Omeprazole) .Marland Kitchen... 1 two times a day for acid symptoms  Problem # 3:   HYPERTENSION (ICD-401.9) Assessment: Unchanged good control no changes needed  Her updated medication list for this problem includes:    Lisinopril 10 Mg Tabs (Lisinopril) .Marland Kitchen... 1 by mouth once daily  BP today: 140/62 Prior BP: 138/80 (10/17/2008)  Prior 10 Yr Risk Heart Disease: 17 % (03/05/2007)  Labs Reviewed: K+: 4.1 (04/28/2008) Creat: : 0.9 (04/28/2008)   Chol: 182 (08/31/2006)   HDL: 43.7 (08/31/2006)   LDL: 123 (08/31/2006)   TG: 75 (08/31/2006)  Problem # 4:  OSTEOARTHRITIS (ICD-715.90) Assessment: Unchanged not needing the meds now saw Dr Wyvonnia Dusky injection once  The following medications were removed from the medication list:    Meloxicam 15 Mg Tabs (Meloxicam) .Marland Kitchen... 1 daily for arthritis pain  Complete Medication List: 1)  Lisinopril 10 Mg Tabs (Lisinopril) .Marland Kitchen.. 1 by mouth once daily 2)  Multivitamins Tabs (Multiple vitamin) .... Once daily 3)  Caltrate 600+d 600-400 Mg-unit Tabs (Calcium carbonate-vitamin d) .... Once daily 4)  Omeprazole 20 Mg Cpdr (Omeprazole) .Marland Kitchen.. 1 two times a day for acid symptoms 5)  Vitamin D3 2000 Unit Caps (Cholecalciferol) .... Take 1 by mouth once daily 6)  Calcium Magnesium 600-100-300 Liqd (Calcium-magnesium-vitamin d) .... Take 1 by mouth once daily 7)  Fish Oil 1000 Mg Caps (Omega-3 fatty acids) .... Take 1 by mouth two times a day  Patient Instructions: 1)  Please schedule a follow-up appointment in 6 months .  Prescriptions: LISINOPRIL 10 MG  TABS (LISINOPRIL) 1 by mouth once daily  #90 x 3   Entered and Authorized by:   Cindee Salt MD   Signed by:   Cindee Salt MD on 01/19/2009   Method used:   Print then Give to Patient   RxID:   [id number]   Current Allergies (reviewed today): ! * N-SAIDS ! ASPIRIN BUFFERED ! CODEINE   EKG  Procedure date:  01/19/2009  Findings:      sinus @52  extensive T wave inversions anterolaterally No sig change since 01/25/06

## 2010-07-28 NOTE — Letter (Signed)
Summary: [facility] / CONSULT RHEUMATOLOGY / [facility] / DR. Lamont Dowdy   Imported By: Carin Primrose 11/18/2008 10:47:48  _____________________________________________________________________  External Attachment:    Type:   Image     Comment:   External Document  Appended Document: [facility] / CONSULT RHEUMATOLOGY / DR. Milas Hock [facility] osteoarthritis 2nd PIP injected

## 2010-07-28 NOTE — Assessment & Plan Note (Signed)
Summary: PAP/BREAST PER MED/[facility]   Vital Signs:  Patient Profile:   70 Years Old Female Height:     61.25 inches (155.57 cm) Weight:      120.50 pounds Temp:     98.4 degrees F oral Pulse rate:   80 / minute Pulse rhythm:   regular BP sitting:   142 / 88  (left arm) Cuff size:   regular  Vitals Entered By: Delilah Shan (March 05, 2007 11:14 AM)                 Chief Complaint:  Pap/breast exam.  History of Present Illness: Female pt of Dr. Karle Starch who comes in today for pap/breast exam and f/u BP. BP has been well controlled at home. She has had 3 nml paps not in a row, recommended DVE today, she requested one last pap smear, then next vaginal exam will be in 3 years Recommend mammogram in 2009, Bone density in 2009  Mother with osteoporosis MGGM: ovarian cancer, she has had nml Ca125 in past UTD with vaccines   Hypertension History:      She denies palpitations, dyspnea with exertion, orthopnea, PND, and peripheral edema.  She notes no problems with any antihypertensive medication side effects.        Positive major cardiovascular risk factors include female age 41 years old or older and hypertension.  Negative major cardiovascular risk factors include non-tobacco-user status.     Current Allergies (reviewed today): ! * N-SAIDS ! ASPIRIN BUFFERED (ASPIRIN BUFF(MGCARB-ALAMINOAC))  Past Medical History:    Reviewed history from 02/27/2007 and no changes required:       Skin cancer, hx of       Diverticulosis, colon       Hypertension       Osteoarthritis   Family History:    Reviewed history from 02/27/2007 and no changes required:       Father: Died, ETOH, DM       Mother: Died ETOH, colon CA       Siblings: 1 brother died of lung CA       CV:  2 distant       HBP: No sign. H/A       DM:  (from ETOH)       Colon CA:  Mom       See Geneology    Review of Systems      See HPI   Physical Exam  Lungs:     Normal respiratory effort, chest expands  symmetrically. Lungs are clear to auscultation, no crackles or wheezes. Heart:     Normal rate and regular rhythm. S1 and S2 normal without gallop, murmur, click, rub or other extra sounds. Genitalia:     Pelvic Exam:        External: normal female genitalia without lesions or masses        Vagina: normal without lesions or masses        Cervix: normal without lesions or masses        Adnexa: normal bimanual exam without masses or fullness        Uterus: normal by palpation        Pap smear: performed    Impression & Recommendations:  Problem # 1:  HYPERTENSION (ICD-401.9) Assessment: Improved Continue current meds. Her updated medication list for this problem includes:    Lisinopril 10 Mg Tabs (Lisinopril) .Marland Kitchen... 1 by mouth once daily   Problem # 2:  GYNECOLOGICAL EXAMINATION, ROUTINE (  ICD-V72.31) Next pap/DVE in 3 years.  Call to schedule next DXA and mammogram. Orders: Pap Smear (57846)   Complete Medication List: 1)  Lisinopril 10 Mg Tabs (Lisinopril) .Marland Kitchen.. 1 by mouth once daily 2)  Multivitamins Tabs (Multiple vitamin) .... Once daily 3)  Caltrate 600+d 600-400 Mg-unit Tabs (Calcium carbonate-vitamin d) .... Once daily  Hypertension Assessment/Plan:      The patient's hypertensive risk group is category B: At least one risk factor (excluding diabetes) with no target organ damage.  Her calculated 10 year risk of coronary heart disease is 17 %.  Today's blood pressure is 142/88.  Her blood pressure goal is < 140/90.   Patient Instructions: 1)  Call for mammogram and DXA  when due next in 2009. 2)  Next f/u with Dr. Alphonsus Sias in 1 year.     Current Allergies (reviewed today): ! * N-SAIDS ! ASPIRIN BUFFERED (ASPIRIN BUFF(MGCARB-ALAMINOAC))

## 2010-07-28 NOTE — Letter (Signed)
Summary: [facility]  [facility]   Imported By: Lanelle Bal 06/16/2009 09:50:44  _____________________________________________________________________  External Attachment:    Type:   Image     Comment:   External Document

## 2010-07-28 NOTE — Medication Information (Signed)
Summary: Approval for Pantoprazole/[facility]  Approval for Pantoprazole/[facility]   Imported By: Lanelle Bal 02/22/2010 08:27:05  _____________________________________________________________________  External Attachment:    Type:   Image     Comment:   External Document

## 2010-07-28 NOTE — Letter (Signed)
Summary: Results Follow up Letter  [place] at [facility]  [address] [place], [place] [address]   Phone: [phone]  Fax: [phone]    03/08/2007 MRN: [id number]  Angela Bowen [address] [place], [place]  [address]  Dear Ms. Ardelle Anton,  The following are the results of your recent test(s):  Test         Result    Pap Smear:        Normal __X___  Not Normal _____ Comments:  Routine follow up in 02/2008. ______________________________________________________ Cholesterol: LDL(Bad cholesterol):         Your goal is less than:         HDL (Good cholesterol):       Your goal is more than: Comments:  ______________________________________________________ Mammogram:        Normal _____  Not Normal _____ Comments:  ___________________________________________________________________ Hemoccult:        Normal _____  Not normal _______ Comments:    _____________________________________________________________________ Other Tests:    We routinely do not discuss normal results over the telephone.  If you desire a copy of the results, or you have any questions about this information we can discuss them at your next office visit.   Sincerely,    Excell Seltzer, M.D.  AEB:lsf

## 2010-07-28 NOTE — Letter (Signed)
Summary: [facility]  [facility]   Imported By: Lanelle Bal 06/16/2009 09:47:12  _____________________________________________________________________  External Attachment:    Type:   Image     Comment:   External Document  Appended Document: [facility] wide excision of melanoma removed from right calf

## 2010-07-28 NOTE — Letter (Signed)
Summary: Results Follow up Letter  [place] [facility]  [address] [place], [place] [address]   Phone: [phone]  Fax: [phone]    09/18/2008 MRN: [id number]  Angela Bowen [address] [place], [place]  [address]  Dear Ms. Ardelle Anton,  The following are the results of your recent test(s):  Test         Result    Pap Smear:        Normal _____  Not Normal _____ Comments: ______________________________________________________ Cholesterol: LDL(Bad cholesterol):         Your goal is less than:         HDL (Good cholesterol):       Your goal is more than: Comments:  ______________________________________________________ Mammogram:        Normal __X___  Not Normal _____ Comments: Repeat in 1 year  ___________________________________________________________________ Hemoccult:        Normal _____  Not normal _______ Comments:    _____________________________________________________________________ Other Tests:    We routinely do not discuss normal results over the telephone.  If you desire a copy of the results, or you have any questions about this information we can discuss them at your next office visit.   Sincerely,      Tillman Abide, MD

## 2010-07-28 NOTE — Letter (Signed)
Summary: [facility]  [facility]   Imported By: Lanelle Bal 06/16/2009 09:48:00  _____________________________________________________________________  External Attachment:    Type:   Image     Comment:   External Document  Appended Document: [facility] surgeon releasing her from care follow up as needed

## 2010-07-28 NOTE — Assessment & Plan Note (Signed)
Summary: ?POISON IVY/CLE   Vital Signs:  Patient profile:   70 year old female Height:      61 inches Weight:      128.25 pounds BMI:     24.32 Temp:     98.4 degrees F oral Pulse rate:   60 / minute Pulse rhythm:   regular BP sitting:   128 / 76  (left arm) Cuff size:   regular  Vitals Entered By: Lewanda Rife LPN (May 20, 2009 2:06 PM)  CC:  poison ivy on rt wrist.  History of Present Illness: Here due to rash on R wrist--itches, helped husband in yard yesterday --has hx of severe reaction to poison ivy  Allergies: 1)  ! * N-Saids 2)  ! Aspirin Buffered 3)  ! Codeine  Review of Systems      See HPI  Physical Exam  General:  alert, well-developed, well-nourished, and well-hydrated.   Skin:  small patches or eryth rash R posterior hand and lower arm--no vesicles, skin intact Psych:  normally interactive, not anxious appearing, and not depressed appearing.     Impression & Recommendations:  Problem # 1:  DERMATITIS, ATOPIC (ICD-691.8) Assessment New will use elocon two times a day to clean dry skin until resolved gave Rx for prednisone dosepack x 6days--fill if worsens--understands see ba ckif no improvement in 1 wk Her updated medication list for this problem includes:    Elocon 0.1 % Crea (Mometasone furoate) .Marland Kitchen... Apply thinnly two times a day to rash  Complete Medication List: 1)  Lisinopril 10 Mg Tabs (Lisinopril) .Marland Kitchen.. 1 by mouth once daily 2)  Multivitamins Tabs (Multiple vitamin) .... Once daily 3)  Caltrate 600+d 600-400 Mg-unit Tabs (Calcium carbonate-vitamin d) .... Two tablets daily 4)  Omeprazole 20 Mg Cpdr (Omeprazole) .Marland Kitchen.. 1 two times a day for acid symptoms 5)  Calcium Magnesium 600-100-300 Liqd (Calcium-magnesium-vitamin d) .... Take 2 by mouth once daily 6)  Fish Oil 1000 Mg Caps (Omega-3 fatty acids) .... Take 1 by mouth two times a day 7)  Elocon 0.1 % Crea (Mometasone furoate) .... Apply thinnly two times a day to rash 8)  Prednisone (pak)  10 Mg Tabs (Prednisone) .... Take as directed x 6days Prescriptions: PREDNISONE (PAK) 10 MG TABS (PREDNISONE) take as directed x 6days  #1 x 0   Entered and Authorized by:   Gildardo Griffes FNP   Signed by:   Gildardo Griffes FNP on 05/20/2009   Method used:   Print then Give to Patient   RxID:   [id number] ELOCON 0.1 % CREA (MOMETASONE FUROATE) apply thinnly two times a day to rash  #1 x 0   Entered and Authorized by:   Gildardo Griffes FNP   Signed by:   Gildardo Griffes FNP on 05/20/2009   Method used:   [facility]        [facility] [facility]       [address]       [place], [place]  [address]       Ph: [phone] or [phone]       Fax: [phone]   RxID:   [id number]   Current Allergies (reviewed today): ! * N-SAIDS ! ASPIRIN BUFFERED ! CODEINE

## 2010-07-28 NOTE — Letter (Signed)
Summary: Results Follow up Letter  [place] at [facility]  [address] [place], [place] [address]   Phone: [phone]  Fax: [phone]    09/19/2007 MRN: [id number]  TALEEYAH BORA [address] [place], [place]  [address]  Dear Ms. Ardelle Anton,  The following are the results of your recent test(s):  Test         Result    Pap Smear:        Normal _____  Not Normal _____ Comments: ______________________________________________________ Cholesterol: LDL(Bad cholesterol):         Your goal is less than:         HDL (Good cholesterol):       Your goal is more than: Comments:  ______________________________________________________ Mammogram:        Normal ___X__  Not Normal _____ Comments:  Routine yearly follow up is recommended.  You will be due again: 09/2008  ___________________________________________________________________ Hemoccult:        Normal _____  Not normal _______ Comments:    _____________________________________________________________________ Other Tests:    We routinely do not discuss normal results over the telephone.  If you desire a copy of the results, or you have any questions about this information we can discuss them at your next office visit.   Sincerely,    Excell Seltzer, M.D.  AEB:lsf

## 2010-07-28 NOTE — Assessment & Plan Note (Signed)
Summary: F/U MEDICATION/CLE   Vital Signs:  Patient Profile:   70 Years Old Female Height:     61.25 inches (155.57 cm) Weight:      127 pounds Temp:     97 degrees F oral Pulse rate:   60 / minute Pulse rhythm:   regular BP sitting:   140 / 78  (right arm) Cuff size:   regular  Vitals Entered By: Lowella Petties (April 28, 2008 4:13 PM)                 Chief Complaint:  Follow up with meds.  History of Present Illness: Doing fine Needs blood pressure med refilled Asks about consequences of long term use of lisinopril discussed that I wouldn't expect any problems---discussed angioedema though  Blood pressure ranges from 127/56 to 138/73  No chest pain  No SOB No vision changes No headaches No edema     Current Allergies: ! * N-SAIDS ! ASPIRIN BUFFERED (ASPIRIN BUFF(MGCARB-ALAMINOAC))  Past Medical History:    Reviewed history from 02/27/2007 and no changes required:       Skin cancer, hx of       Diverticulosis, colon       Hypertension       Osteoarthritis  Past Surgical History:    Reviewed history from 02/27/2007 and no changes required:       1984      Vein stripping bilat.       2001      Cholecystectomy       2004      Vein stripping, right foot       8/07        Echo/Stress Echo nl       5/98        Echo - fairly nl.       2-3/08     Sclerotherapy. 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   Neck:     supple, no masses, no thyromegaly, no carotid bruits, and no cervical lymphadenopathy.   Lungs:     normal respiratory effort and  normal breath sounds.   Heart:     normal rate, regular rhythm, no murmur, and no gallop.   Extremities:     no edema Psych:     normally interactive, good eye contact, and not anxious appearing.      Impression & Recommendations:  Problem # 1:  HYPERTENSION (ICD-401.9) Assessment: Unchanged good control  Her updated medication list for this problem includes:    Lisinopril 10 Mg Tabs (Lisinopril) .Marland Kitchen... 1 by mouth once daily  BP today: 140/78 Prior BP: 120/60 (01/14/2008)  Prior 10 Yr Risk Heart Disease: 17 % (03/05/2007)  Labs Reviewed: Creat: 1.0 (08/31/2006) Chol: 182 (08/31/2006)   HDL: 43.7 (08/31/2006)   LDL: 123 (08/31/2006)   TG: 75 (08/31/2006)  Orders: Venipuncture (33295) TLB-CBC Platelet - w/Differential (85025-CBCD) TLB-Renal Function Panel (80069-RENAL) TLB-TSH (Thyroid Stimulating Hormone) (84443-TSH)   Complete Medication List: 1)  Lisinopril 10 Mg Tabs (Lisinopril) .Marland Kitchen.. 1 by mouth once daily 2)  Multivitamins Tabs (Multiple vitamin) .... Once daily 3)  Caltrate 600+d 600-400 Mg-unit Tabs (Calcium carbonate-vitamin d) .... Once daily   Patient Instructions: 1)  Please schedule a follow-up appointment in 1 year.   ] Prior Medications (reviewed today): LISINOPRIL 10 MG  TABS (LISINOPRIL) 1 by mouth once daily MULTIVITAMINS   TABS (MULTIPLE VITAMIN) once daily CALTRATE 600+D 600-400 MG-UNIT  TABS (CALCIUM CARBONATE-VITAMIN D) once daily Current Allergies: ! * N-SAIDS ! ASPIRIN BUFFERED (ASPIRIN BUFF(MGCARB-ALAMINOAC))

## 2010-07-28 NOTE — Assessment & Plan Note (Signed)
Summary: POISON IVY/CLE   Vital Signs:  Patient Profile:   70 Years Old Female Height:     61.25 inches (155.57 cm) Weight:      123 pounds (55.91 kg) Temp:     97.5 degrees F (36.39 degrees C) oral Pulse rate:   56 / minute Pulse rhythm:   regular BP sitting:   120 / 60  (left arm) Cuff size:   regular  Vitals Entered By: Silas Sacramento (January 14, 2008 4:45 PM)                 Chief Complaint:  Poison Ivy.  History of Present Illness: Poison ivy tends to get every year  Exposed about 1 week ago neighbor brought 3 goats to pasture and she must have gotten it from touching them  Using zanfel--OTC cream but has not been helping much      Current Allergies: ! * N-SAIDS ! ASPIRIN BUFFERED (ASPIRIN BUFF(MGCARB-ALAMINOAC))  Past Medical History:    Reviewed history from 02/27/2007 and no changes required:       Skin cancer, hx of       Diverticulosis, colon       Hypertension       Osteoarthritis  Past Surgical History:    Reviewed history from 02/27/2007 and no changes required:       1984      Vein stripping bilat.       2001      Cholecystectomy       2004      Vein stripping, right foot       8/07        Echo/Stress Echo nl       5/98        Echo - fairly nl.       2-3/08     Sclero Dx. 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   Skin:     scattered papular rash mostly on arms some on legs also    Impression & Recommendations:  Problem # 1:  CONTACT DERMATITIS, NOS (ICD-692.9)  Her updated medication list for this problem includes:    Prednisone  20 Mg Tabs (Prednisone) .Marland KitchenMarland KitchenMarland KitchenMarland Kitchen 3 daily for 3days, then 2 daily for 3 days and then 1 daily for 3 days    Triamcinolone Acetonide 0.1 % Crea (Triamcinolone acetonide) .Marland Kitchen... Apply to rash three times a day as needed for itching   Complete Medication List: 1)  Lisinopril 10 Mg Tabs (Lisinopril) .Marland Kitchen.. 1 by mouth once daily 2)  Multivitamins Tabs (Multiple vitamin) .... Once daily 3)  Caltrate 600+d 600-400 Mg-unit Tabs (Calcium carbonate-vitamin d) .... Once daily 4)  Prednisone 20 Mg Tabs (Prednisone) .... 3 daily for 3days, then 2 daily for 3 days and then 1 daily for 3 days 5)  Triamcinolone Acetonide 0.1 % Crea (Triamcinolone acetonide) .Marland KitchenMarland KitchenMarland Kitchen  Apply to rash three times a day as needed for itching   Patient Instructions: 1)  Please schedule a follow-up appointment as needed.   Prescriptions: TRIAMCINOLONE ACETONIDE 0.1 %  CREA (TRIAMCINOLONE ACETONIDE) apply to rash three times a day as needed for itching  #1 tube x 0   Entered and Authorized by:   Cindee Salt MD   Signed by:   Cindee Salt MD on 01/14/2008   Method used:   [facility]       [facility]  [address] [phone]       [address]       [place], [place]  [address]       Ph: [phone]       Fax: [phone]   RxID:   [id number] PREDNISONE 20 MG  TABS (PREDNISONE) 3 daily for 3days, then 2 daily for 3 days and then 1 daily for 3 days  #18 x 0   Entered and Authorized by:   Cindee Salt MD   Signed by:   Cindee Salt MD on 01/14/2008   Method used:   [facility] ...       [facility]  [address] [phone][facility]       [address]       [place], [place]  [address]       Ph: [phone]       Fax: [phone]   RxID:   [id number]  ]

## 2010-07-28 NOTE — Consult Note (Signed)
Summary: Consultation Report  Consultation Report   Imported By: Beau Fanny 11/13/2006 16:56:43  _____________________________________________________________________  External Attachment:    Type:   Image     Comment:   External Document

## 2010-07-28 NOTE — Progress Notes (Signed)
Summary: prior Berkley Harvey is needed for protonix- changed to nexium  Phone Note From Pharmacy   Caller: [facility] Summary of Call: Prior Berkley Harvey is needed for protonix, form is on your desk. Initial call taken by: Lowella Petties CMA,  February 02, 2010 12:38 PM  Follow-up for Phone Call        Please call patient Insurance will not approve the protonix unless she also tries nexium (preferred on their plan) Please send Rx for nexium 40mg  daily  (1 year Rx) If she fails this, we can go back to protonix Follow-up by: Cindee Salt MD,  February 04, 2010 12:32 PM  Additional Follow-up for Phone Call Additional follow up Details #1::        Advised pt, nexium added to medlist, [facility] Additional Follow-up by: Lowella Petties CMA,  February 04, 2010 2:50 PM    New/Updated Medications: NEXIUM 40 MG CPDR (ESOMEPRAZOLE MAGNESIUM) take one by mouth daily Prescriptions: NEXIUM 40 MG CPDR (ESOMEPRAZOLE MAGNESIUM) take one by mouth daily  #90 x 3   Entered by:   Lowella Petties CMA   Authorized by:   Cindee Salt MD   Signed by:   Lowella Petties CMA on 02/04/2010   Method used:   Faxed to ...       [facility])       [address] [address]       [place], [place]  [address]       Ph: [phone]       Fax: [phone]   RxID:   [id number]   Prior Medications: NEXIUM 40 MG CPDR (ESOMEPRAZOLE MAGNESIUM) take one by mouth daily LISINOPRIL 10 MG  TABS (LISINOPRIL) 1 by mouth once daily MULTIVITAMINS   TABS (MULTIPLE VITAMIN) once daily CALTRATE 600+D 600-400 MG-UNIT  TABS (CALCIUM CARBONATE-VITAMIN D) two tablets daily CALCIUM MAGNESIUM 600-100-300 LIQD (CALCIUM-MAGNESIUM-VITAMIN D) take 2 by mouth once daily Current Allergies: ! * N-SAIDS ! ASPIRIN BUFFERED ! CODEINE

## 2010-07-29 NOTE — Assessment & Plan Note (Signed)
Summary: 6 M F/U DLO   Vital Signs:  Patient profile:   70 year old female Weight:      122 pounds BMI:     23.14 Temp:     98.4 degrees F oral Pulse rate:   56 / minute Pulse rhythm:   regular BP sitting:   152 / 64  (left arm) Cuff size:   regular  Vitals Entered By: Mervin Hack CMA Duncan Dull) (July 12, 2010 10:35 AM) CC: 6 month follow-up   History of Present Illness: "something has been going on" While in yard, turned and couldn't control herself---she fell into a bush" This was several months No syncope No other times with balance problems Some other falls--but only when she trips on something  Has noted some upper back pain Occurs with exertion---points to lower thoracic area generally with heavy yard work Doesn't take any meds for this  Still with nodules on fingers they hurt at times Got cortisone shot in right 2nd PIP in past uses tylenol at times  Has been monitoring BP at home Generally 140/80 Rare headaches No chest pain or sig SOB  Nexium that she got isn't helping discussed pantoprazole  Allergies: 1)  ! * N-Saids 2)  ! Aspirin Buffered 3)  ! Codeine  Past History:  Past medical, surgical, family and social histories (including risk factors) reviewed for relevance to current acute and chronic problems.  Past Medical History: Reviewed history from 07/23/2009 and no changes required. Skin cancer, hx of Diverticulosis, colon Hypertension Osteoarthritis GERD  Past Surgical History: Reviewed history from 06/01/2009 and no changes required. 1984      Vein stripping bilat. 2001      Cholecystectomy 2004      Vein stripping, right foot 8/07        Echo/Stress Echo nl 5/98        Echo - fairly nl. 2-3/08     Sclerotherapy. - left leg Donia Ast) 7/10        Melanoma on leg  Family History: Reviewed history from 01/19/2009 and no changes required. Father: Died, ETOH, DM Mother: Died ETOH, colon CA. Had osteoporosis Siblings: 1 brother  died of lung CA CV:  2 distant HBP: No sign. H/A DM:  (from ETOH) Colon CA:  Mom See Geneology Arthritis in aunts on both sides  Social History: Reviewed history from 02/27/2007 and no changes required. 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   Neck:  supple, no masses, no thyromegaly, and no cervical lymphadenopathy.   Lungs:  normal respiratory effort, no intercostal retractions, no accessory muscle use, and normal breath sounds.   Heart:  normal rate, regular rhythm, no murmur, and no gallop.   Abdomen:  soft, non-tender, and no masses.   Msk:  marked nodules in DIP and PIPs in hands Extremities:  no edema Psych:  normally interactive, good eye contact, not anxious appearing, and not depressed appearing.     Impression & Recommendations:  Problem # 1:  OSTEOARTHRITIS (ICD-715.90) Assessment Deteriorated  using tylenol will add as needed tramadol  Her updated medication list for this problem includes:    Tramadol Hcl 50 Mg Tabs (Tramadol hcl) .Marland Kitchen... 1/2-1 tab by mouth three times a day as  needed for arthritis pain  Problem # 2:  GERD (ICD-530.81) Assessment: Deteriorated nexium not helping  will change to pantoprazole but try two times a day  The following medications were removed from the medication list:    Nexium 40 Mg Cpdr (Esomeprazole magnesium) .Marland Kitchen... Take one by mouth daily Her updated medication list for this problem includes:    Pantoprazole Sodium 40 Mg Tbec (Pantoprazole sodium) .Marland Kitchen... 1 tab by mouth two times a day for acid reflux  Problem # 3:  HYPERTENSION (ICD-401.9) Assessment: Unchanged  generally good control no changes for now  Her updated medication list for this problem  includes:    Lisinopril 10 Mg Tabs (Lisinopril) .Marland Kitchen... 1 by mouth once daily  BP today: 152/64 Prior BP: 142/80 (01/22/2010)  Prior 10 Yr Risk Heart Disease: 17 % (03/05/2007)  Labs Reviewed: K+: 3.9 (07/23/2009) Creat: : 0.9 (07/23/2009)   Chol: 192 (07/23/2009)   HDL: 38.60 (07/23/2009)   LDL: 118 (07/23/2009)   TG: 176.0 (07/23/2009)  Orders: TLB-Renal Function Panel (80069-RENAL) TLB-CBC Platelet - w/Differential (85025-CBCD) TLB-Hepatic/Liver Function Pnl (80076-HEPATIC) TLB-TSH (Thyroid Stimulating Hormone) (84443-TSH) Venipuncture ([address])  Complete Medication List: 1)  Pantoprazole Sodium 40 Mg Tbec (Pantoprazole sodium) .Marland Kitchen.. 1 tab by mouth two times a day for acid reflux 2)  Lisinopril 10 Mg Tabs (Lisinopril) .Marland Kitchen.. 1 by mouth once daily 3)  Multivitamins Tabs (Multiple vitamin) .... Once daily 4)  Caltrate 600+d 600-400 Mg-unit Tabs (Calcium carbonate-vitamin d) .... Two tablets daily 5)  Calcium Magnesium 600-100-300 Liqd (Calcium-magnesium-vitamin d) .... Take 2 by mouth once daily 6)  Potassium Gluconate 595 Mg Cr-tabs (Potassium gluconate) .... Once daily as needed 7)  Tramadol Hcl 50 Mg Tabs (Tramadol hcl) .... 1/2-1 tab by mouth three times a day as needed for arthritis pain  Patient Instructions: 1)  Please schedule a follow-up appointment in 6 months for Medicare Wellness exam Prescriptions: TRAMADOL HCL 50 MG TABS (TRAMADOL HCL) 1/2-1 tab by mouth three times a day as needed for arthritis pain  #90 x 0   Entered and Authorized by:   Cindee Salt MD   Signed by:   Cindee Salt MD on 07/12/2010   Method used:   [facility]  [address] [id number]       [address]       [place], [place]  [address]       Ph: [phone] or [phone]       Fax: [phone]   RxID:   [id number] PANTOPRAZOLE SODIUM 40 MG TBEC (PANTOPRAZOLE SODIUM) 1 tab by mouth two times a day for acid reflux  #180 x 3   Entered and Authorized by:    Cindee Salt MD   Signed by:   Cindee Salt MD on 07/12/2010   Method used:   Electronically to        [facility] ([facility]order)       [address]       [place], [place]  [address]       Ph: [phone]       Fax: [phone]   RxID:   [id number]    Orders Added: 1)  Est. Patient Level IV [55732] 2)  TLB-Renal Function Panel [80069-RENAL] 3)  TLB-CBC Platelet - w/Differential [85025-CBCD] 4)  TLB-Hepatic/Liver Function Pnl [80076-HEPATIC] 5)  TLB-TSH (Thyroid Stimulating Hormone) [84443-TSH] 6)  Venipuncture [20254]    Current Allergies (reviewed today): ! * N-SAIDS ! ASPIRIN BUFFERED !  CODEINE

## 2010-11-10 ENCOUNTER — Ambulatory Visit (HOSPITAL_BASED_OUTPATIENT_CLINIC_OR_DEPARTMENT_OTHER)
Admission: RE | Admit: 2010-11-10 | Discharge: 2010-11-10 | Disposition: A | Discharge status: Home / Self Care | Payer: Medicare Other | Source: Ambulatory Visit | Attending: Orthopedic Surgery | Admitting: Orthopedic Surgery

## 2010-11-10 ENCOUNTER — Other Ambulatory Visit: Payer: Self-pay | Admitting: Orthopedic Surgery

## 2010-11-10 DIAGNOSIS — I10 Essential (primary) hypertension: Secondary | ICD-10-CM | POA: Insufficient documentation

## 2010-11-10 DIAGNOSIS — Z01812 Encounter for preprocedural laboratory examination: Secondary | ICD-10-CM | POA: Insufficient documentation

## 2010-11-10 DIAGNOSIS — K219 Gastro-esophageal reflux disease without esophagitis: Secondary | ICD-10-CM | POA: Insufficient documentation

## 2010-11-10 DIAGNOSIS — M6740 Ganglion, unspecified site: Secondary | ICD-10-CM | POA: Insufficient documentation

## 2010-11-10 LAB — POCT I-STAT, CHEM 8
BUN: 19 mg/dL (ref 6–23)
Calcium, Ion: 1.16 mmol/L (ref 1.12–1.32)
Chloride: 107 mEq/L (ref 96–112)
Creatinine, Ser: 1 mg/dL (ref 0.4–1.2)
Glucose, Bld: 101 mg/dL — ABNORMAL HIGH (ref 70–99)
HCT: 44 % (ref 36.0–46.0)
Hemoglobin: 15 g/dL (ref 12.0–15.0)
Potassium: 4.4 mEq/L (ref 3.5–5.1)
Sodium: 141 mEq/L (ref 135–145)
TCO2: 26 mmol/L (ref 0–100)

## 2010-11-12 NOTE — Op Note (Signed)
Angela Bowen, Angela Bowen              ACCOUNT NO.:  [id number]   MEDICAL RECORD NO.:  [id number]          PATIENT TYPE:  AMB   LOCATION:  ENDO                         FACILITY:  [facility]   PHYSICIAN:  Anselmo Rod, M.D.  DATE OF BIRTH:  [birth year]   DATE OF PROCEDURE:  05/12/2004  DATE OF DISCHARGE:                                 OPERATIVE REPORT   PROCEDURE PERFORMED:  Screening colonoscopy.   ENDOSCOPIST:  Charna Elizabeth, M.D.   INSTRUMENT USED:  Olympus video colonoscope.   INDICATIONS FOR PROCEDURE:  The patient is a 70 year old white female  undergoing screening colonoscopy for family history of colon cancer in her  mother was diagnosed in her 32s.  Rule out colonic polyps, masses, etc.   PREPROCEDURE PREPARATION:  Informed consent was procured from the patient.  The patient was fasted for eight hours prior to the procedure and prepped  with a bottle of magnesium citrate and a gallon of GoLYTELY the night prior  to the procedure.   PREPROCEDURE PHYSICAL:  The patient had stable vital signs.  Neck supple.  Chest clear to auscultation.  S1 and S2 regular.  Abdomen soft with normal  bowel sounds.   DESCRIPTION OF PROCEDURE:  The patient was placed in left lateral decubitus  position and sedated with 75 mg of Demerol and 7.5 mg of Versed in slow  incremental doses.  Once the patient was adequately sedated and maintained  on low flow oxygen and continuous cardiac monitoring, the Olympus video  colonoscope was advanced from the rectum to the cecum and terminal ileum  with slight difficulty because of a tortuous colon.  The appendicular  orifice and ileocecal valve were clearly visualized and photographed.  The  terminal ileum appeared healthy and without lesions.  Except for a few early  sigmoid diverticula, no other abnormalities were noted.  The patient  tolerated the procedure well without immediate complications.   IMPRESSION:  1.  Few early sigmoid diverticula noted.  2.  No  masses or polyps identified.  3.  Normal-appearing transverse colon, right colon, cecum and terminal      ileum.   RECOMMENDATIONS:  1.  Repeat colonoscopy is recommended in the next five years unless the      patient develops any abnormal symptoms in the interim.  2.  Continue high fiber diet with liberal fluid intake.  Brochures have been      given to the patient on diverticulosis for her understanding.  3.  Outpatient followup as need arises in the future.       JNM/MEDQ  D:  05/12/2004  T:  05/12/2004  Job:  [id number]   cc:   Candyce Churn. Allyne Gee, M.D.  [address]  [address]  [place]  [place] [address]  Fax: [phone]

## 2010-12-20 ENCOUNTER — Telehealth: Payer: Self-pay | Admitting: *Deleted

## 2010-12-20 MED ORDER — ZOSTER VACCINE LIVE 19400 UNT/0.65ML ~~LOC~~ SOLR: 0.6500 mL | Freq: Once | SUBCUTANEOUS | Status: DC

## 2010-12-20 NOTE — Telephone Encounter (Signed)
rx sent to pharmacy by [facility], left message that rx was called in

## 2010-12-20 NOTE — Telephone Encounter (Signed)
Okay to send order. 

## 2010-12-20 NOTE — Telephone Encounter (Signed)
Patient is asking if she can get an order for shingles vaccine sent to [facility] [address] She has already checked with her insurance company.

## 2010-12-25 ENCOUNTER — Encounter: Payer: Self-pay | Admitting: Internal Medicine

## 2010-12-28 ENCOUNTER — Encounter: Payer: Self-pay | Admitting: Internal Medicine

## 2010-12-28 ENCOUNTER — Ambulatory Visit (INDEPENDENT_AMBULATORY_CARE_PROVIDER_SITE_OTHER): Payer: Medicare Other | Admitting: Internal Medicine

## 2010-12-28 VITALS — BP 126/70 | HR 74 | Temp 98.4°F | Ht 61.0 in | Wt 123.0 lb

## 2010-12-28 DIAGNOSIS — Z0000 Encounter for general adult medical examination without abnormal findings: Secondary | ICD-10-CM

## 2010-12-29 NOTE — Progress Notes (Signed)
  Subjective:    Patient ID: Angela Bowen, female    DOB: Oct 12, 1940, 70 y.o.   MRN: [id number]  HPI No show for appt   Review of Systems     Objective:   Physical Exam        Assessment & Plan:

## 2010-12-31 ENCOUNTER — Encounter: Payer: Self-pay | Admitting: Internal Medicine

## 2010-12-31 ENCOUNTER — Ambulatory Visit (INDEPENDENT_AMBULATORY_CARE_PROVIDER_SITE_OTHER): Payer: Medicare Other | Admitting: Internal Medicine

## 2010-12-31 DIAGNOSIS — M1990 Unspecified osteoarthritis, unspecified site: Secondary | ICD-10-CM

## 2010-12-31 DIAGNOSIS — Z1231 Encounter for screening mammogram for malignant neoplasm of breast: Secondary | ICD-10-CM

## 2010-12-31 DIAGNOSIS — Z1211 Encounter for screening for malignant neoplasm of colon: Secondary | ICD-10-CM

## 2010-12-31 DIAGNOSIS — Z0000 Encounter for general adult medical examination without abnormal findings: Secondary | ICD-10-CM

## 2010-12-31 DIAGNOSIS — I10 Essential (primary) hypertension: Secondary | ICD-10-CM

## 2010-12-31 DIAGNOSIS — K219 Gastro-esophageal reflux disease without esophagitis: Secondary | ICD-10-CM

## 2010-12-31 NOTE — Assessment & Plan Note (Signed)
BP Readings from Last 3 Encounters:  12/31/10 118/68  12/28/10 126/70  07/12/10 152/64   Good control now No changes Lab Results  Component Value Date   CREATININE 1.00 11/10/2010

## 2010-12-31 NOTE — Assessment & Plan Note (Signed)
Uses tylenol and aleve May need corrective surgery for left 3rd finger

## 2010-12-31 NOTE — Progress Notes (Signed)
Subjective:    Patient ID: Angela Bowen, female    DOB: 1941/03/13, 70 y.o.   MRN: [id number]  HPI Here for Wellness visit Form reviewed  Ongoing arthritis problems Had surgery on left 3rd finger in may---benign lesion removed Takes tylenol and occ aleve  Checks BP occ Usually 140/80 No chest pain No SOB No edema  Stomach has been okay Still burps and belches ---meds doesn't seem to help--plans to stop the pantoprazole No problems with lactose No sorbitol products  Current Outpatient Prescriptions on File Prior to Visit  Medication Sig Dispense Refill  . Calcium-Magnesium (CALCIUM MAGNESIUM 750) 300-300 MG TABS Take 2 by mouth once a day       . lisinopril (PRINIVIL,ZESTRIL) 10 MG tablet Take 10 mg by mouth daily.        . Multiple Vitamin (MULTIVITAMIN) tablet Take 1 tablet by mouth daily.        . Potassium Gluconate 595 MG CAPS Once daily as needed         Allergies  Allergen Reactions  . Aspirin   . Codeine     REACTION: rash    Past Medical History  Diagnosis Date  . History of skin cancer   . Diverticulosis of colon   . Hypertension   . Osteoarthritis   . GERD (gastroesophageal reflux disease)   . S/P sclerotherapy of varicose veins 07/30/06    left leg, Dr. Donia Ast  . Normal stress echocardiogram 01/2006    echo 5/98, fairly normal    Past Surgical History  Procedure Date  . Varicose vein surgery 1984    bilateral  . Cholecystectomy 2001  . Varicose vein surgery 2004    right foot  . Melanoma excision 7/10    leg    Family History  Problem Relation Age of Onset  . Alcohol abuse Mother   . Cancer Mother     colon  . Osteoporosis Mother   . Alcohol abuse Father   . Diabetes Father   . Cancer Brother     lung    History   Social History  . Marital Status: Married    Spouse Name: N/A    Number of Children: 2  . Years of Education: N/A   Occupational History  . homemaker    Social History Main Topics  . Smoking status: Never  Smoker   . Smokeless tobacco: Not on file  . Alcohol Use: Yes     Wine, rarely  . Drug Use: No  . Sexually Active: Not on file   Other Topics Concern  . Not on file   Social History Narrative   No regular exercise.Husband retired from [employer].   Review of Systems Weight is stable Sleeps well No current skin problems--regular with derm    Objective:   Physical Exam  Constitutional: She is oriented to person, place, and time. She appears well-developed and well-nourished. No distress.  HENT:  Mouth/Throat: Oropharynx is clear and moist. No oropharyngeal exudate.  Neck: Normal range of motion. No thyromegaly present.  Cardiovascular: Normal rate, regular rhythm, normal heart sounds and intact distal pulses.  Exam reveals no gallop.   No murmur heard. Pulmonary/Chest: No respiratory distress. She has no wheezes. She has no rales.  Abdominal: Soft. She exhibits no mass. There is no tenderness.  Musculoskeletal: Normal range of motion. She exhibits no edema and no tenderness.  Lymphadenopathy:    She has no cervical adenopathy.  Neurological: She is alert and oriented to person,  place, and time.       Louis Meckel, [place]" [phone] D-l-r-[id number]-w Recall 3/3  Skin: No rash noted.  Psychiatric: She has a normal mood and affect. Her behavior is normal. Judgment and thought content normal.          Assessment & Plan:

## 2010-12-31 NOTE — Assessment & Plan Note (Addendum)
I have personally reviewed the Medicare Annual Wellness questionnaire and have noted 1. The patient's medical and social history 2. Their use of alcohol, tobacco or illicit drugs 3. Their current medications and supplements 4. The patient's functional ability including ADL's, fall risks, home safety risks and hearing or visual             impairment. 5. Diet and physical activities 6. Evidence for depression or mood disorders  The patients weight, height, BMI and visual acuity have been recorded in the chart I have made referrals, counseling and provided education to the patient based review of the above and I have provided the pt with a written personalized care plan for preventive services.  I have provided you with a copy of your personalized plan for preventive services. Please take the time to review along with your updated medication list.  No real concerns identified Just got zostavax Rx given for Tdap Will set up mammogram Will set up DEXA

## 2010-12-31 NOTE — Assessment & Plan Note (Signed)
Symptoms are not clearly acid based She is going to try off the PPI

## 2010-12-31 NOTE — Patient Instructions (Signed)
Please set up your mammogram and bone density test Please go to the pharmacy for your tetanus booster (prescription given)

## 2011-01-03 ENCOUNTER — Other Ambulatory Visit: Payer: Self-pay | Admitting: Internal Medicine

## 2011-01-03 DIAGNOSIS — Z1231 Encounter for screening mammogram for malignant neoplasm of breast: Secondary | ICD-10-CM

## 2011-01-05 ENCOUNTER — Other Ambulatory Visit: Payer: Self-pay | Admitting: *Deleted

## 2011-01-05 DIAGNOSIS — Z780 Asymptomatic menopausal state: Secondary | ICD-10-CM

## 2011-01-05 DIAGNOSIS — Z1231 Encounter for screening mammogram for malignant neoplasm of breast: Secondary | ICD-10-CM

## 2011-01-10 ENCOUNTER — Ambulatory Visit: Payer: Medicare Other

## 2011-01-13 ENCOUNTER — Ambulatory Visit: Payer: Medicare Other

## 2011-01-13 ENCOUNTER — Ambulatory Visit
Admission: RE | Admit: 2011-01-13 | Discharge: 2011-01-13 | Disposition: A | Discharge status: Home / Self Care | Payer: Medicare Other | Source: Ambulatory Visit | Attending: Internal Medicine | Admitting: Internal Medicine

## 2011-01-13 DIAGNOSIS — Z780 Asymptomatic menopausal state: Secondary | ICD-10-CM

## 2011-01-13 DIAGNOSIS — Z1231 Encounter for screening mammogram for malignant neoplasm of breast: Secondary | ICD-10-CM

## 2011-01-14 NOTE — Op Note (Signed)
  Angela Bowen, Angela Bowen              ACCOUNT NO.:  [id number]  MEDICAL RECORD NO.:  [id number]           PATIENT TYPE:  LOCATION:                                 FACILITY:  PHYSICIAN:  Cindee Salt, M.D.            DATE OF BIRTH:  DATE OF PROCEDURE:  11/10/2010 DATE OF DISCHARGE:                              OPERATIVE REPORT   PREOPERATIVE DIAGNOSIS:  Mucoid tumor, right middle finger.  POSTOPERATIVE DIAGNOSIS:  Mucoid tumor, right middle finger.  OPERATION:  Excision of mucoid tumor with debridement, distal interphalangeal joint, right middle finger.  SURGEON:  Cindee Salt, MD  ANESTHESIA:  Forearm-based IV regional with local metacarpal block.  ANESTHESIOLOGIST:  Zenon Mayo, MD  HISTORY:  The patient is a 70 year old female with a history of a mucoid cyst of her right middle finger.  She has elected to undergo surgical excision.  Pre, peri, and postoperative course have been discussed along with risks and complications.  She is aware that there is no guarantee with surgery, possibility of infection, recurrence injury to arteries, nerves, tendons, complete relief of symptoms, dystrophy.  In the preoperative area, the patient was seen.  The extremity was marked by both the patient and surgeon.  Antibiotic was given.  DESCRIPTION OF PROCEDURE:  The patient was brought to the operating room where a forearm-based IV regional anesthetic was carried out without difficulty.  She was prepped using ChloraPrep in the supine position with the right arm free.  A 3-minute dry time was allowed.  Time-out was taken, confirming the patient and the procedure.  A curvilinear incision was made over the distal interphalangeal joint of the thumb, middle phalanx, carried down through the subcutaneous tissue.  Bleeders were electrocauterized with bipolar.  Cyst was immediately encountered distally.  With blunt sharp dissection, this was dissected free and sent to Pathology.  The joint was  opened.  A synovectomy to the joint was performed along with a debridement.  Very large osteophyte was present on the attachment of the extensor tendon.  This was not entirely debrided.  No further lesions were identified.  The wound was irrigated. Specimen was sent to Pathology.  The wound was then closed with interrupted 5-0 Vicryl Rapide sutures.  Sterile compressive dressing and splint was applied after a metacarpal block with 0.25% Marcaine, 5 mL was used.  On deflation of the tourniquet, the remaining fingers pinked.  She was taken to the recovery room for observation in satisfactory condition.  She will be discharged home to return to [facility] in 1 week on [place] [facility].          ______________________________ Cindee Salt, M.D.     GK/MEDQ  D:  11/10/2010  T:  11/10/2010  Job:  [id number]  Electronically Signed by Cindee Salt M.D. on 01/14/2011 09:13:07 AM

## 2011-01-15 ENCOUNTER — Encounter: Payer: Self-pay | Admitting: Internal Medicine

## 2011-01-17 ENCOUNTER — Other Ambulatory Visit: Payer: Self-pay | Admitting: Internal Medicine

## 2011-01-20 ENCOUNTER — Other Ambulatory Visit: Payer: Self-pay | Admitting: Dermatology

## 2011-03-08 ENCOUNTER — Ambulatory Visit (INDEPENDENT_AMBULATORY_CARE_PROVIDER_SITE_OTHER): Payer: Medicare Other | Admitting: Internal Medicine

## 2011-03-08 ENCOUNTER — Encounter: Payer: Self-pay | Admitting: Internal Medicine

## 2011-03-08 DIAGNOSIS — M810 Age-related osteoporosis without current pathological fracture: Secondary | ICD-10-CM

## 2011-03-08 NOTE — Progress Notes (Signed)
  Subjective:    Patient ID: Angela Bowen, female    DOB: 01/11/1941, 70 y.o.   MRN: [id number]  HPI Here with husband Reviewing DEXA Did start vitamin D On calcium Not really exercising---discussed weight bearing exercise  Does have belching Only uses Tums--no PPI  Current Outpatient Prescriptions on File Prior to Visit  Medication Sig Dispense Refill  . Calcium-Magnesium (CALCIUM MAGNESIUM 750) 300-300 MG TABS Take by mouth daily.       Marland Kitchen lisinopril (PRINIVIL,ZESTRIL) 10 MG tablet TAKE 1 TABLET BY MOUTH ONCE DAILY  90 tablet  2  . Multiple Vitamin (MULTIVITAMIN) tablet Take 1 tablet by mouth daily.        . Potassium Gluconate 595 MG CAPS Once daily as needed         Allergies  Allergen Reactions  . Aspirin   . Codeine     REACTION: rash    Past Medical History  Diagnosis Date  . History of skin cancer   . Diverticulosis of colon   . Hypertension   . Osteoarthritis   . GERD (gastroesophageal reflux disease)   . S/P sclerotherapy of varicose veins 07/30/06    left leg, Dr. Donia Ast  . Normal stress echocardiogram 01/2006    echo 5/98, fairly normal  . Osteoporosis, unspecified     Past Surgical History  Procedure Date  . Varicose vein surgery 1984    bilateral  . Cholecystectomy 2001  . Varicose vein surgery 2004    right foot  . Melanoma excision 7/10    leg    Family History  Problem Relation Age of Onset  . Alcohol abuse Mother   . Cancer Mother     colon  . Osteoporosis Mother   . Alcohol abuse Father   . Diabetes Father   . Cancer Brother     lung    History   Social History  . Marital Status: Married    Spouse Name: N/A    Number of Children: 2  . Years of Education: N/A   Occupational History  . homemaker    Social History Main Topics  . Smoking status: Never Smoker   . Smokeless tobacco: Never Used  . Alcohol Use: Yes     Wine, rarely  . Drug Use: No  . Sexually Active: Not on file   Other Topics Concern  . Not on file    Social History Narrative   No regular exercise.Husband retired from [employer].   Review of Systems     Objective:   Physical Exam        Assessment & Plan:

## 2011-03-08 NOTE — Assessment & Plan Note (Signed)
Discussed considering meds She doesn't want bisphosphonate at this point May be better to wait 3 years, recheck DEXA, then use only if there is a sig decline  counselled all of 15 minute visit

## 2011-06-13 ENCOUNTER — Other Ambulatory Visit: Payer: Self-pay | Admitting: *Deleted

## 2011-06-13 MED ORDER — TRAMADOL HCL 50 MG PO TABS: 50.0000 mg | ORAL_TABLET | Freq: Two times a day (BID) | ORAL | Status: DC | PRN

## 2011-06-13 NOTE — Telephone Encounter (Signed)
Fax on your desk asking for refill of Tramadol 50mg  tablet, this is no longer on her active med list, was taking off in July, please advise.  Spoke with patient and she states she doesn't take it very often, but her hands are getting more painful from the arthritis.

## 2011-06-13 NOTE — Telephone Encounter (Signed)
Okay #180 x 0 Can send electronically or fax form---I filled it out

## 2011-06-13 NOTE — Telephone Encounter (Signed)
rx faxed to pharmacy manually  

## 2011-08-05 ENCOUNTER — Other Ambulatory Visit: Payer: Self-pay | Admitting: Internal Medicine

## 2011-09-16 ENCOUNTER — Encounter: Payer: Self-pay | Admitting: Internal Medicine

## 2011-09-16 ENCOUNTER — Ambulatory Visit (INDEPENDENT_AMBULATORY_CARE_PROVIDER_SITE_OTHER): Payer: Medicare Other | Admitting: Internal Medicine

## 2011-09-16 VITALS — BP 140/80 | HR 64 | Temp 98.2°F | Ht 61.0 in | Wt 123.0 lb

## 2011-09-16 DIAGNOSIS — K219 Gastro-esophageal reflux disease without esophagitis: Secondary | ICD-10-CM

## 2011-09-16 DIAGNOSIS — M810 Age-related osteoporosis without current pathological fracture: Secondary | ICD-10-CM | POA: Diagnosis not present

## 2011-09-16 DIAGNOSIS — M1990 Unspecified osteoarthritis, unspecified site: Secondary | ICD-10-CM

## 2011-09-16 DIAGNOSIS — J019 Acute sinusitis, unspecified: Secondary | ICD-10-CM | POA: Diagnosis not present

## 2011-09-16 DIAGNOSIS — I10 Essential (primary) hypertension: Secondary | ICD-10-CM | POA: Diagnosis not present

## 2011-09-16 NOTE — Assessment & Plan Note (Signed)
On calcium and vitamin D Will try again to do weight bearing exercise Repeat DEXA 2015 and meds if has worsened

## 2011-09-16 NOTE — Assessment & Plan Note (Signed)
BP Readings from Last 3 Encounters:  09/16/11 140/80  03/08/11 147/51  12/31/10 118/68   Adequate control No changes needed

## 2011-09-16 NOTE — Assessment & Plan Note (Signed)
Left 3rd finger and knees Uses tramadol occ

## 2011-09-16 NOTE — Assessment & Plan Note (Signed)
Better No meds

## 2011-09-16 NOTE — Assessment & Plan Note (Signed)
Has had 3 weeks of symptoms Finally improving If worsens again, will send Rx for amoxil

## 2011-09-16 NOTE — Progress Notes (Signed)
Subjective:    Patient ID: Angela Bowen, female    DOB: 30-Mar-1941, 71 y.o.   MRN: [id number]  HPI She and husband have been sick for 3 weeks No fever Green mucus from nose Started as head cold and in chest now Now feeling better Breathing is okay  Has increased the vitamin D to 2000 units daily--bought the wrong dose Discussed going back to 1000 units daily Started exercise and flared arthritis Planning to restart but take it more slowly  No chest pain No SOB No dizziness or syncope No throat issues or cough when not sick  Still having the finger pain Uses tramadol prn and that helps  Current Outpatient Prescriptions on File Prior to Visit  Medication Sig Dispense Refill  . cholecalciferol (VITAMIN D) 1000 UNITS tablet Take 2,000 Units by mouth daily.       Marland Kitchen lisinopril (PRINIVIL,ZESTRIL) 10 MG tablet TAKE 1 TABLET BY MOUTH ONCE DAILY  90 tablet  2  . Multiple Vitamin (MULTIVITAMIN) tablet Take 1 tablet by mouth daily.        . Potassium Gluconate 595 MG CAPS Once daily as needed       . traMADol (ULTRAM) 50 MG tablet TAKE 1 TABLET TWICE DAILY AS NEEDED FOR PAIN  180 tablet  0    Allergies  Allergen Reactions  . Aspirin   . Codeine     REACTION: rash    Past Medical History  Diagnosis Date  . History of skin cancer   . Diverticulosis of colon   . Hypertension   . Osteoarthritis   . GERD (gastroesophageal reflux disease)   . S/P sclerotherapy of varicose veins 07/30/06    left leg, Dr. Donia Ast  . Normal stress echocardiogram 01/2006    echo 5/98, fairly normal  . Osteoporosis, unspecified     Past Surgical History  Procedure Date  . Varicose vein surgery 1984    bilateral  . Cholecystectomy 2001  . Varicose vein surgery 2004    right foot  . Melanoma excision 7/10    leg    Family History  Problem Relation Age of Onset  . Alcohol abuse Mother   . Cancer Mother     colon  . Osteoporosis Mother   . Alcohol abuse Father   . Diabetes Father   .  Cancer Brother     lung    History   Social History  . Marital Status: Married    Spouse Name: N/A    Number of Children: 2  . Years of Education: N/A   Occupational History  . homemaker    Social History Main Topics  . Smoking status: Never Smoker   . Smokeless tobacco: Never Used  . Alcohol Use: Yes     Wine, rarely  . Drug Use: No  . Sexually Active: Not on file   Other Topics Concern  . Not on file   Social History Narrative   No regular exercise.Husband retired from [employer].   Review of Systems Sleeps well in general Appetite is good Weight is stable     Objective:   Physical Exam  Constitutional: She appears well-developed and well-nourished. No distress.  HENT:  Mouth/Throat: Oropharynx is clear and moist. No oropharyngeal exudate.       Mild nasal congestion TMs normal  Neck: Normal range of motion. Neck supple. No thyromegaly present.  Cardiovascular: Normal rate, regular rhythm and normal heart sounds.  Exam reveals no gallop.   No murmur heard. Pulmonary/Chest:  Effort normal and breath sounds normal. No respiratory distress. She has no wheezes. She has no rales.  Musculoskeletal: She exhibits no edema and no tenderness.  Lymphadenopathy:    She has no cervical adenopathy.  Psychiatric: She has a normal mood and affect. Her behavior is normal.          Assessment & Plan:

## 2011-09-19 ENCOUNTER — Telehealth: Payer: Self-pay

## 2011-09-19 MED ORDER — OMEPRAZOLE 20 MG PO CPDR: 20.0000 mg | DELAYED_RELEASE_CAPSULE | Freq: Every day | ORAL | Status: DC

## 2011-09-19 NOTE — Telephone Encounter (Signed)
Pt saw Dr Alphonsus Sias on 09/16/11 and did not discuss esophageal reflux. Pt said has burping and indigestion after eating any food. Pt said Omeprazole had helped before and would [facility] [phone]. Pt can be reached at [phone].

## 2011-09-19 NOTE — Telephone Encounter (Signed)
rx sent to pharmacy [facility] Spoke with patient and advised results

## 2011-09-19 NOTE — Telephone Encounter (Signed)
Okay to send Rx for omeprazole 20mg  #90 x 3 1 daily on empty stomach

## 2011-10-24 ENCOUNTER — Other Ambulatory Visit: Payer: Self-pay | Admitting: Internal Medicine

## 2012-01-06 ENCOUNTER — Encounter: Payer: Self-pay | Admitting: Family Medicine

## 2012-01-06 ENCOUNTER — Ambulatory Visit (INDEPENDENT_AMBULATORY_CARE_PROVIDER_SITE_OTHER): Payer: Medicare Other | Admitting: Family Medicine

## 2012-01-06 VITALS — BP 162/80 | HR 68 | Temp 97.9°F | Wt 116.5 lb

## 2012-01-06 DIAGNOSIS — L255 Unspecified contact dermatitis due to plants, except food: Secondary | ICD-10-CM | POA: Diagnosis not present

## 2012-01-06 DIAGNOSIS — L237 Allergic contact dermatitis due to plants, except food: Secondary | ICD-10-CM | POA: Insufficient documentation

## 2012-01-06 MED ORDER — PREDNISONE 20 MG PO TABS: ORAL_TABLET | ORAL | Status: DC

## 2012-01-06 NOTE — Progress Notes (Signed)
  Subjective:    Patient ID: Angela Bowen, female    DOB: 1941-04-18, 71 y.o.   MRN: [id number]  HPI CC: skin rash  Recently worked outside.  Several days later noticed skin rash.  Started on right anterior knee, now spreading.  Under right arm as well.  Very itchy.  Similar to prior poison ivy rash.  Using xanphel treatment which isn't helping.    Has taken oral prednisone on past.  Rash seems to be spreading.  Review of Systems Per HPI    Objective:   Physical Exam WDWN CF NAD Right anterior medial knee with eruption of small pruritic erythematous vesicles.  Smaller eruption under right upper arm - linear distribution    Assessment & Plan:

## 2012-01-06 NOTE — Patient Instructions (Addendum)
Looks like poison ivy again - treat with steroid course. Update [place] if not improving as expected  Poison [facility] ivy is a inflammation of the skin (contact dermatitis) caused by touching the allergens on the leaves of the ivy plant following previous exposure to the plant. The rash usually appears 48 hours after exposure. The rash is usually bumps (papules) or blisters (vesicles) in a linear pattern. Depending on your own sensitivity, the rash may simply cause redness and itching, or it may also progress to blisters which may break open. These must be well cared for to prevent secondary bacterial (germ) infection, followed by scarring. Keep any open areas dry, clean, dressed, and covered with an antibacterial ointment if needed. The eyes may also get puffy. The puffiness is worst in the morning and gets better as the day progresses. This dermatitis usually heals without scarring, within 2 to 3 weeks without treatment. HOME CARE INSTRUCTIONS  Thoroughly wash with soap and water as soon as you have been exposed to poison ivy. You have about one half hour to remove the plant resin before it will cause the rash. This washing will destroy the oil or antigen on the skin that is causing, or will cause, the rash. Be sure to wash under your fingernails as any plant resin there will continue to spread the rash. Do not rub skin vigorously when washing affected area. Poison ivy cannot spread if no oil from the plant remains on your body. A rash that has progressed to weeping sores will not spread the rash unless you have not washed thoroughly. It is also important to wash any clothes you have been wearing as these may carry active allergens. The rash will return if you wear the unwashed clothing, even several days later. Avoidance of the plant in the future is the best measure. Poison ivy plant can be recognized by the number of leaves. Generally, poison ivy has three leaves with flowering branches on a single  stem. Diphenhydramine may be purchased over the counter and used as needed for itching. Do not drive with this medication if it makes you drowsy.Ask your caregiver about medication for children. SEEK MEDICAL CARE IF:  Open sores develop.   Redness spreads beyond area of rash.   You notice purulent (pus-like) discharge.   You have increased pain.   Other signs of infection develop (such as fever).  Document Released: 06/10/2000 Document Revised: 06/02/2011 Document Reviewed: 04/29/2009 [facility] Patient Information 2012 [place], [place].

## 2012-01-06 NOTE — Assessment & Plan Note (Signed)
antciipate contact dermatitis to poison ivy.  As spreading, will treat with oral course of prednisone. Discussed steroid precautions.

## 2012-01-30 ENCOUNTER — Ambulatory Visit (INDEPENDENT_AMBULATORY_CARE_PROVIDER_SITE_OTHER): Payer: Medicare Other | Admitting: Family Medicine

## 2012-01-30 ENCOUNTER — Encounter: Payer: Self-pay | Admitting: Family Medicine

## 2012-01-30 VITALS — BP 126/82 | HR 64 | Temp 98.2°F | Wt 116.2 lb

## 2012-01-30 DIAGNOSIS — R079 Chest pain, unspecified: Secondary | ICD-10-CM | POA: Diagnosis not present

## 2012-01-30 LAB — LDL CHOLESTEROL, DIRECT: Direct LDL: 121.8 mg/dL

## 2012-01-30 LAB — BASIC METABOLIC PANEL
BUN: 13 mg/dL (ref 6–23)
CO2: 28 mEq/L (ref 19–32)
Calcium: 9.5 mg/dL (ref 8.4–10.5)
Chloride: 105 mEq/L (ref 96–112)
Creatinine, Ser: 0.8 mg/dL (ref 0.4–1.2)
GFR: 76.17 mL/min (ref >60.00)
Glucose, Bld: 111 mg/dL — ABNORMAL HIGH (ref 70–99)
Potassium: 4.4 mEq/L (ref 3.5–5.1)
Sodium: 140 mEq/L (ref 135–145)

## 2012-01-30 LAB — CK: Total CK: 95 U/L (ref 7–177)

## 2012-01-30 LAB — TROPONIN I: Troponin I: <0.01 ng/mL (ref <0.06)

## 2012-01-30 MED ORDER — OMEPRAZOLE 40 MG PO CPDR: 40.0000 mg | DELAYED_RELEASE_CAPSULE | Freq: Two times a day (BID) | ORAL | Status: DC

## 2012-01-30 NOTE — Patient Instructions (Addendum)
Start baby aspirin.  Blood work today to check heart enzymes. Pass by Marion's office for referral to cardiologist for further evaluation. We will also increase treatment of reflux to omeprazole 40mg  twice daily. If any repeat episodes of chest pain/pressure, please go to ER.

## 2012-01-30 NOTE — Addendum Note (Signed)
Addended by: Eustaquio Boyden on: 01/30/2012 05:34 PM   Modules accepted: Orders

## 2012-01-30 NOTE — Addendum Note (Signed)
Addended by: Alvina Chou on: 01/30/2012 12:40 PM   Modules accepted: Orders

## 2012-01-30 NOTE — Progress Notes (Signed)
Subjective:    Patient ID: Angela Bowen, female    DOB: Nov 08, 1940, 71 y.o.   MRN: [id number]  HPI CC: chest pain  Pleasant 71 yo with h/o HTN, OP, OA, GERD presents with h/o chest discomfort that occurred yesterday at church.  Had significant substernal pain described as pressure with SOB associated with it.  Lasted <10 minutes.  Has had 2 other episodes like this happen in past.  This also happened in 10/2011.  No nausea.  BP was 150/80, HR regular (checked by nurse at church).  Given 4 baby aspirins, which seemed to help this.  Pt declined ER eval for this.  Easily diaphoretic over last several weeks.  No pain/pressure currently  Discomfort not exertional.  Not relieved by rest.  Significant GERD hx.  Takes 2 omeprazole daily, continues symptomatic though (belching).  Not food related.  Pain not reproducible.  Last saw cardiologist around 2007 with normal stress echo.  Nonsmoker, no significant fmhx CAD. Lab Results  Component Value Date   CHOL 192 07/23/2009   HDL 38.60* 07/23/2009   LDLCALC 118* 07/23/2009   TRIG 176.0* 07/23/2009   CHOLHDL 5 07/23/2009    Medications and allergies reviewed and updated in chart.  Past histories reviewed and updated if relevant as below. Patient Active Problem List  Diagnosis  . HYPERTENSION  . GERD  . DIVERTICULOSIS, COLON  . OSTEOARTHRITIS  . SKIN CANCER, HX OF  . Routine general medical examination at a health care facility  . Postmenopausal  . Osteoporosis, unspecified  . Acute sinusitis, unspecified  . Poison ivy   Past Medical History  Diagnosis Date  . History of skin cancer   . Diverticulosis of colon   . Hypertension   . Osteoarthritis   . GERD (gastroesophageal reflux disease)   . S/P sclerotherapy of varicose veins 07/30/06    left leg, Dr. Donia Ast  . Normal stress echocardiogram 01/2006    echo 5/98, fairly normal  . Osteoporosis, unspecified    Past Surgical History  Procedure Date  . Varicose vein surgery 1984   bilateral  . Cholecystectomy 2001  . Varicose vein surgery 2004    right foot  . Melanoma excision 7/10    leg   History  Substance Use Topics  . Smoking status: Never Smoker   . Smokeless tobacco: Never Used  . Alcohol Use: Yes     Wine, rarely   Family History  Problem Relation Age of Onset  . Alcohol abuse Mother   . Cancer Mother     colon  . Osteoporosis Mother   . Alcohol abuse Father   . Diabetes Father   . Cancer Brother     lung   Allergies  Allergen Reactions  . Aspirin   . Codeine     REACTION: rash   Current Outpatient Prescriptions on File Prior to Visit  Medication Sig Dispense Refill  . calcium carbonate (OS-CAL) 600 MG TABS Take 600 mg by mouth daily.      . cholecalciferol (VITAMIN D) 1000 UNITS tablet Take 2,000 Units by mouth daily.       Marland Kitchen lisinopril (PRINIVIL,ZESTRIL) 10 MG tablet TAKE 1 TABLET BY MOUTH ONCE DAILY  90 tablet  2  . Multiple Vitamin (MULTIVITAMIN) tablet Take 1 tablet by mouth daily.        Marland Kitchen omeprazole (PRILOSEC) 20 MG capsule Take 1 capsule (20 mg total) by mouth daily.  90 capsule  3  . traMADol (ULTRAM) 50 MG tablet TAKE  1 TABLET TWICE DAILY AS NEEDED FOR PAIN  180 tablet  0  . Potassium Gluconate 595 MG CAPS Once daily as needed       . DISCONTD: Calcium-Magnesium (CALCIUM MAGNESIUM 750) 300-300 MG TABS Take by mouth daily.           Review of Systems Per HPI    Objective:   Physical Exam  Nursing note and vitals reviewed. Constitutional: She appears well-developed and well-nourished. No distress.  HENT:  Head: Normocephalic and atraumatic.  Mouth/Throat: Oropharynx is clear and moist. No oropharyngeal exudate.  Eyes: Conjunctivae and EOM are normal. Pupils are equal, round, and reactive to light. No scleral icterus.  Neck: Normal range of motion. Neck supple. Carotid bruit is not present.  Cardiovascular: Normal rate, regular rhythm, normal heart sounds and intact distal pulses.   No murmur heard. Pulmonary/Chest:  Effort normal and breath sounds normal. No respiratory distress. She has no wheezes. She has no rales. She exhibits no tenderness (no chest wall pain).  Abdominal: Soft. Bowel sounds are normal. She exhibits no distension. There is no tenderness. There is no rebound and no guarding.  Musculoskeletal: She exhibits no edema.  Lymphadenopathy:    She has no cervical adenopathy.  Skin: Skin is warm and dry. No rash noted.       Assessment & Plan:

## 2012-01-30 NOTE — Assessment & Plan Note (Signed)
Substernal chest pressure sensation associated with SOB. EKG today - NSR 64, normal axis, intervals, diffuse T wave flattening with inverted Ts lateral leads, normal R wave progression overall unchanged compared to last EKG 2011 though. Will refer to cards as will likely need risk stratification with stress test. Start baby ASA today while gets in with cards. Will also treat possible GERD component with increase PPI to omeprazole 40mg  bid. Only cardiac risk factors include HTN (well controlled) and mild dyslipidemia.

## 2012-01-31 ENCOUNTER — Telehealth: Payer: Self-pay

## 2012-01-31 LAB — CREATININE KINASE MB: CK-MB: 1.2 ng/mL (ref 0.3–4.0)

## 2012-01-31 NOTE — Telephone Encounter (Signed)
Caller: Delaney Meigs Facility: [facility] Patient: Angela Bowen, Angela Bowen DOB: 1940-12-14 Phone: [phone] Reason for Call: Delaney Meigs is calling from [place] regarding a Trponin I ordered on [place], [place] by Dr Gutierrez8/10/2011 12:40:00 PM. The results were normal, less than 0.01. Regarding Appointment: Appt Date: Appt Time: Unknown Provider: Reason: Details: Outcome:

## 2012-01-31 NOTE — Telephone Encounter (Signed)
Noted. See results.

## 2012-02-02 DIAGNOSIS — Z85828 Personal history of other malignant neoplasm of skin: Secondary | ICD-10-CM | POA: Diagnosis not present

## 2012-02-02 DIAGNOSIS — Z85820 Personal history of malignant melanoma of skin: Secondary | ICD-10-CM | POA: Diagnosis not present

## 2012-02-02 DIAGNOSIS — D239 Other benign neoplasm of skin, unspecified: Secondary | ICD-10-CM | POA: Diagnosis not present

## 2012-02-02 DIAGNOSIS — L570 Actinic keratosis: Secondary | ICD-10-CM | POA: Diagnosis not present

## 2012-02-06 ENCOUNTER — Encounter: Payer: Self-pay | Admitting: Cardiovascular Disease

## 2012-02-06 ENCOUNTER — Ambulatory Visit (INDEPENDENT_AMBULATORY_CARE_PROVIDER_SITE_OTHER): Payer: Medicare Other | Admitting: Cardiovascular Disease

## 2012-02-06 VITALS — BP 120/72 | HR 56 | Ht 61.0 in | Wt 117.0 lb

## 2012-02-06 DIAGNOSIS — I10 Essential (primary) hypertension: Secondary | ICD-10-CM

## 2012-02-06 DIAGNOSIS — R079 Chest pain, unspecified: Secondary | ICD-10-CM | POA: Diagnosis not present

## 2012-02-06 DIAGNOSIS — Z0000 Encounter for general adult medical examination without abnormal findings: Secondary | ICD-10-CM

## 2012-02-06 NOTE — Progress Notes (Signed)
   Patient ID: Angela Bowen, female    DOB: Sep 12, 1940, 71 y.o.   MRN: [id number]  HPI Comments: Angela Bowen is a very pleasant 71 year old woman, patient of Dr. Sharen Hones, with no significant cardiac history, previous echocardiogram and treadmill study in 2007 for abnormal EKG who presents by referral for chest pain.   She reports that over the past several months, she has had episodes of chest pain. They seem to come on at rest, not typically with exertion. It has happened "several times", most recently last weekend while at church lasting for 10 minutes. Pain was in the center of her lower mediastinum. It was hard to catch her breath. She was given aspirin x4. She declined going to the emergency room. Similar episodes have felt the same way, with chest pain typically at rest, not with exertion such as climbing stairs or working in her yard. It seems to happen every several months.   She does have episodes of diaphoresis which seem unrelated. She reports getting sweaty episodes quite easily such as when she's in the yard. She does report an episode last year where she fell into some bushes after she was lightheaded and diaphoretic.   She also reports having a small hernia several years ago while she was bending forward. She noticed a protrusion from her upper epigastric area. She does have significant burping and gas. She did have an EGD several years ago.  She has chronic back pain, sometimes with stretching to the right of her lumbar spine.  EKG shows normal sinus rhythm with rate 56 beats per minute, nonspecifiic ST and T-wave abnormality in V4, V5, 1 and aVL     Review of Systems  Constitutional: Negative.   HENT: Negative.   Eyes: Negative.   Respiratory: Negative.   Cardiovascular: Positive for chest pain.       Diaphoresis  Gastrointestinal: Negative.        Belching, burping  Musculoskeletal: Negative.   Skin: Negative.   Neurological: Positive for dizziness.  Hematological:  Negative.   Psychiatric/Behavioral: Negative.       Physical Exam

## 2012-02-06 NOTE — Patient Instructions (Addendum)
You are doing well. No medication changes were made.  We will schedule a routine treadmill study in the office for chest pain  Please call us if you have new issues that need to be addressed before your next appt.

## 2012-02-06 NOTE — Assessment & Plan Note (Signed)
Blood pressure is well controlled on today's visit. No changes made to the medications. 

## 2012-02-06 NOTE — Assessment & Plan Note (Signed)
Cholesterol is in an average range. No other risk factors, nonsmoker, nondiabetic

## 2012-02-06 NOTE — Assessment & Plan Note (Signed)
Chest pain is somewhat atypical. No significant risk factors. We have suggested she have a routine treadmill study. If this is normal, and if symptoms persist, she could have further GI workup.

## 2012-02-07 ENCOUNTER — Other Ambulatory Visit: Payer: Self-pay | Admitting: *Deleted

## 2012-02-07 MED ORDER — OMEPRAZOLE 40 MG PO CPDR: 40.0000 mg | DELAYED_RELEASE_CAPSULE | Freq: Two times a day (BID) | ORAL | Status: DC

## 2012-02-17 ENCOUNTER — Ambulatory Visit (INDEPENDENT_AMBULATORY_CARE_PROVIDER_SITE_OTHER): Payer: Medicare Other | Admitting: Cardiovascular Disease

## 2012-02-17 ENCOUNTER — Encounter: Payer: Self-pay | Admitting: Cardiovascular Disease

## 2012-02-17 VITALS — BP 128/70 | HR 73 | Ht 61.0 in | Wt 116.0 lb

## 2012-02-17 DIAGNOSIS — R079 Chest pain, unspecified: Secondary | ICD-10-CM

## 2012-02-17 NOTE — Procedures (Signed)
Exercise Treadmill Test  Treadmill ordered for recent epsiodes of chest pain.  Resting EKG shows NSR with rate of 71 bpm, no significant ST or T wave changes Resting blood pressure of 128/70. Stand bruce protocal was used.  Patient exercised for 8 min  Peak heart rate of 134 bpm.  This was 90% of the maximum predicted heart rate (target heart rate 149). Achieved 10.1 METS No symptoms of chest pain or lightheadedness were reported at peak stress or in recovery.  Peak Blood pressure recorded was 140/90. Heart rate at 3 minutes in recovery was 83 bpm.  FINAL IMPRESSION: Normal exercise stress test. No significant EKG changes concerning for ischemia. Excellent exercise tolerance.

## 2012-03-07 DIAGNOSIS — Z23 Encounter for immunization: Secondary | ICD-10-CM | POA: Diagnosis not present

## 2012-03-07 DIAGNOSIS — H5200 Hypermetropia, unspecified eye: Secondary | ICD-10-CM | POA: Diagnosis not present

## 2012-03-07 DIAGNOSIS — H2510 Age-related nuclear cataract, unspecified eye: Secondary | ICD-10-CM | POA: Diagnosis not present

## 2012-03-16 DIAGNOSIS — H2510 Age-related nuclear cataract, unspecified eye: Secondary | ICD-10-CM | POA: Diagnosis not present

## 2012-03-21 ENCOUNTER — Other Ambulatory Visit: Payer: Self-pay | Admitting: Internal Medicine

## 2012-03-21 NOTE — Telephone Encounter (Signed)
rx sent to pharmacy by e-script [facility] to have patient schedule medicare wellness

## 2012-03-21 NOTE — Telephone Encounter (Signed)
Okay #180 x 0 Have her schedule the Medicare wellness within the next few months

## 2012-03-26 ENCOUNTER — Encounter: Payer: Medicare Other | Admitting: Internal Medicine

## 2012-03-26 DIAGNOSIS — H2510 Age-related nuclear cataract, unspecified eye: Secondary | ICD-10-CM | POA: Diagnosis not present

## 2012-04-04 DIAGNOSIS — H2510 Age-related nuclear cataract, unspecified eye: Secondary | ICD-10-CM | POA: Diagnosis not present

## 2012-04-09 DIAGNOSIS — H2510 Age-related nuclear cataract, unspecified eye: Secondary | ICD-10-CM | POA: Diagnosis not present

## 2012-04-09 DIAGNOSIS — H2589 Other age-related cataract: Secondary | ICD-10-CM | POA: Diagnosis not present

## 2012-08-02 ENCOUNTER — Encounter: Payer: Self-pay | Admitting: Gastroenterology

## 2012-08-23 ENCOUNTER — Encounter: Payer: Self-pay | Admitting: Gastroenterology

## 2012-08-23 ENCOUNTER — Ambulatory Visit (INDEPENDENT_AMBULATORY_CARE_PROVIDER_SITE_OTHER): Payer: Medicare Other | Admitting: Gastroenterology

## 2012-08-23 VITALS — BP 140/74 | HR 68 | Ht 60.25 in | Wt 115.0 lb

## 2012-08-23 DIAGNOSIS — R198 Other specified symptoms and signs involving the digestive system and abdomen: Secondary | ICD-10-CM

## 2012-08-23 DIAGNOSIS — Z800 Family history of malignant neoplasm of digestive organs: Secondary | ICD-10-CM | POA: Diagnosis not present

## 2012-08-23 DIAGNOSIS — R194 Change in bowel habit: Secondary | ICD-10-CM

## 2012-08-23 NOTE — Assessment & Plan Note (Signed)
Plan colonoscopy to rule out a structural abnormality of the colon. In the interim she was instructed to supplement her diet with fiber.

## 2012-08-23 NOTE — Patient Instructions (Addendum)
You have been scheduled for a colonoscopy with propofol. Please follow written instructions given to you at your visit today.  Please pick up your prep kit at the pharmacy within the next 1-3 days. If you use inhalers (even only as needed) or a CPAP machine, please bring them with you on the day of your procedure.  We have given you a PrepoPik sample kit

## 2012-08-23 NOTE — Progress Notes (Signed)
History of Present Illness: Pleasant 72 year old white female referred at the request of Dr. Alphonsus Sias for evaluation of change in bowel habits. Over the past 6 months she's had a tendency to be constipated. This may be followed by diarrhea. She's noted dark spots in her stools but no frank blood. There has been no change in diet or medicines. Family history is pertinent for mother who had colon cancer. Her last colonoscopy was 9 years ago and demonstrated diverticulosis.    Past Medical History  Diagnosis Date  . History of skin cancer   . Diverticulosis of colon   . Hypertension   . Osteoarthritis   . GERD (gastroesophageal reflux disease)   . S/P sclerotherapy of varicose veins 07/30/06    left leg, Dr. Donia Ast  . Normal stress echocardiogram 01/2006    echo 5/98, fairly normal  . Osteoporosis, unspecified   . Cancer     stage II melanoma; right leg;face  . Gallstones    Past Surgical History  Procedure Laterality Date  . Varicose vein surgery  1984    bilateral  . Cholecystectomy  2001  . Varicose vein surgery  2004    right foot  . Melanoma excision  7/10    leg  . Foot surgery      right   family history includes Alcohol abuse in her father and mother; Colon cancer in her mother; Diabetes in her father; Lung cancer in her brother; Osteoporosis in her mother; and Ovarian cancer in her maternal grandmother. Current Outpatient Prescriptions  Medication Sig Dispense Refill  . calcium carbonate (OS-CAL) 600 MG TABS Take 600 mg by mouth daily.      . cholecalciferol (VITAMIN D) 1000 UNITS tablet Take 1,000 Units by mouth daily.       Marland Kitchen lisinopril (PRINIVIL,ZESTRIL) 10 MG tablet TAKE 1 TABLET BY MOUTH ONCE DAILY  90 tablet  2  . Multiple Vitamin (MULTIVITAMIN) tablet Take 1 tablet by mouth daily.        Marland Kitchen omeprazole (PRILOSEC) 40 MG capsule Take 1 capsule (40 mg total) by mouth 2 (two) times daily.  180 capsule  2  . traMADol (ULTRAM) 50 MG tablet TAKE 1 TABLET TWICE DAILY AS NEEDED  FOR PAIN  180 tablet  0  . [DISCONTINUED] Calcium-Magnesium (CALCIUM MAGNESIUM 750) 300-300 MG TABS Take by mouth daily.        No current facility-administered medications for this visit.   Allergies as of 08/23/2012 - Review Complete 08/23/2012  Allergen Reaction Noted  . Aspirin    . Codeine  01/19/2009    reports that she has never smoked. She has never used smokeless tobacco. She reports that  drinks alcohol. She reports that she does not use illicit drugs.     Review of Systems: She complains of arthritic pain in her fingers. Pertinent positive and negative review of systems were noted in the above HPI section. All other review of systems were otherwise negative.  Vital signs were reviewed in today's medical record Physical Exam: General: Well developed , well nourished, no acute distress Skin: anicteric Head: Normocephalic and atraumatic Eyes:  sclerae anicteric, EOMI Ears: Normal auditory acuity Mouth: No deformity or lesions Neck: Supple, no masses or thyromegaly Lungs: Clear throughout to auscultation Heart: Regular rate and rhythm; no murmurs, rubs or bruits Abdomen: Soft, non tender and non distended. No masses, hepatosplenomegaly or hernias noted. Normal Bowel sounds Rectal:deferred Musculoskeletal: Symmetrical with no gross deformities  Skin: No lesions on visible extremities Pulses:  Normal pulses noted Extremities: No clubbing, cyanosis, edema or deformities noted Neurological: Alert oriented x 4, grossly nonfocal Cervical Nodes:  No significant cervical adenopathy Inguinal Nodes: No significant inguinal adenopathy Psychological:  Alert and cooperative. Normal mood and affect

## 2012-08-23 NOTE — Assessment & Plan Note (Signed)
Plan colonoscopy 

## 2012-10-01 ENCOUNTER — Encounter: Payer: Self-pay | Admitting: Family Medicine

## 2012-10-01 ENCOUNTER — Ambulatory Visit (INDEPENDENT_AMBULATORY_CARE_PROVIDER_SITE_OTHER): Payer: Medicare Other | Admitting: Family Medicine

## 2012-10-01 VITALS — BP 132/80 | HR 68 | Temp 98.2°F | Wt 117.5 lb

## 2012-10-01 DIAGNOSIS — L237 Allergic contact dermatitis due to plants, except food: Secondary | ICD-10-CM

## 2012-10-01 DIAGNOSIS — M810 Age-related osteoporosis without current pathological fracture: Secondary | ICD-10-CM | POA: Diagnosis not present

## 2012-10-01 DIAGNOSIS — L255 Unspecified contact dermatitis due to plants, except food: Secondary | ICD-10-CM

## 2012-10-01 MED ORDER — PREDNISONE 10 MG PO TABS: ORAL_TABLET | ORAL | Status: DC

## 2012-10-01 MED ORDER — CLOBETASOL PROPIONATE 0.05 % EX CREA: TOPICAL_CREAM | Freq: Two times a day (BID) | CUTANEOUS | Status: DC

## 2012-10-01 NOTE — Assessment & Plan Note (Signed)
Discussed recommended daily calcium intake and dietary vs supplement source.

## 2012-10-01 NOTE — Assessment & Plan Note (Signed)
Return of poison ivy dermatitis. Treat with oral prednisone course as well as topical steroids. Update [place] if sxs persist despite this. Pt agrees.  Has tolerated steroids in past.

## 2012-10-01 NOTE — Patient Instructions (Signed)
Take oral steroids as prescribed. Take topical steroids twice daily at affected areas for 2 weeks. May use benadryl or zyrtec as needed for itch. Let us know if not improving as expected.

## 2012-10-01 NOTE — Progress Notes (Signed)
  Subjective:    Patient ID: Angela Bowen, female    DOB: [birth year], 73 y.o.   MRN: [id number]  HPI CC: poison ivy  Yearly occurrence. Last week, helping sister in law clearing fence.  Sister in law also exposed. Started as 3 bumps.  Spreading.  R forearm, both ankles.  Very itchy.  Waking her up at night to sleep.  Treated at home with zanphel.  Not helping.  Past Medical History  Diagnosis Date  . History of skin cancer   . Diverticulosis of colon   . Hypertension   . Osteoarthritis   . GERD (gastroesophageal reflux disease)   . S/P sclerotherapy of varicose veins 07/30/06    left leg, Dr. Donia Ast  . Normal stress echocardiogram 01/2006    echo 5/98, fairly normal  . Osteoporosis, unspecified   . Cancer     stage II melanoma; right leg;face  . Gallstones      Review of Systems Per HPI    Objective:   Physical Exam WDWN CF in NAD R forearm with faint vesicular rash clustered around anterior wrists and spreading up to medial elbow, no significant erythema. R>L medial ankles with clear clusters of vesicles  Very pruritic.    Assessment & Plan:

## 2012-10-09 ENCOUNTER — Ambulatory Visit (AMBULATORY_SURGERY_CENTER): Payer: Medicare Other | Admitting: Gastroenterology

## 2012-10-09 ENCOUNTER — Encounter: Payer: Self-pay | Admitting: Gastroenterology

## 2012-10-09 VITALS — BP 142/63 | HR 58 | Temp 96.7°F | Resp 9 | Ht 60.25 in | Wt 115.0 lb

## 2012-10-09 DIAGNOSIS — K219 Gastro-esophageal reflux disease without esophagitis: Secondary | ICD-10-CM | POA: Diagnosis not present

## 2012-10-09 DIAGNOSIS — R198 Other specified symptoms and signs involving the digestive system and abdomen: Secondary | ICD-10-CM

## 2012-10-09 DIAGNOSIS — K5730 Diverticulosis of large intestine without perforation or abscess without bleeding: Secondary | ICD-10-CM

## 2012-10-09 DIAGNOSIS — D126 Benign neoplasm of colon, unspecified: Secondary | ICD-10-CM

## 2012-10-09 DIAGNOSIS — I10 Essential (primary) hypertension: Secondary | ICD-10-CM | POA: Diagnosis not present

## 2012-10-09 DIAGNOSIS — Z1211 Encounter for screening for malignant neoplasm of colon: Secondary | ICD-10-CM | POA: Diagnosis not present

## 2012-10-09 DIAGNOSIS — Z800 Family history of malignant neoplasm of digestive organs: Secondary | ICD-10-CM | POA: Diagnosis not present

## 2012-10-09 MED ORDER — SODIUM CHLORIDE 0.9 % IV SOLN: 500.0000 mL | INTRAVENOUS | Status: DC

## 2012-10-09 NOTE — Progress Notes (Signed)
Called to room to assist during endoscopic procedure.  Patient ID and intended procedure confirmed with present staff. Received instructions for my participation in the procedure from the performing physician.  

## 2012-10-09 NOTE — Op Note (Signed)
[facility] [address] [place], [address]   COLONOSCOPY PROCEDURE REPORT  PATIENT: Angela Bowen, Angela Bowen.  MR#: [id number] BIRTHDATE: 09/02/1940 , 72  yrs. old GENDER: Female ENDOSCOPIST: Louis Meckel, MD REFERRED BY: PROCEDURE DATE:  10/09/2012 PROCEDURE:   Colonoscopy with cold biopsy polypectomy ASA CLASS:   Class II INDICATIONS:Patient's immediate family history of colon cancer. MEDICATIONS: MAC sedation, administered by CRNA and propofol (Diprivan) 200mg  IV  DESCRIPTION OF PROCEDURE:   After the risks benefits and alternatives of the procedure were thoroughly explained, informed consent was obtained.  A digital rectal exam revealed no abnormalities of the rectum.   The LB [id number]-[id number]  endoscope was introduced through the anus and advanced to the cecum, which was identified by both the appendix and ileocecal valve. No adverse events experienced.   The quality of the prep was Suprep excellent The instrument was then slowly withdrawn as the colon was fully examined.      COLON FINDINGS: A sessile polyp was found in the rectum.  A polypectomy was performed with cold forceps.  The resection was complete and the polyp tissue was completely retrieved.   Moderate diverticulosis was noted in the sigmoid colon.   The colon mucosa was otherwise normal.  Retroflexed views revealed no abnormalities. The time to cecum=4 minutes 52 seconds.  Withdrawal time=7 minutes 20 seconds.  The scope was withdrawn and the procedure completed. COMPLICATIONS: There were no complications.  ENDOSCOPIC IMPRESSION: 1.   Sessile polyp was found in the rectum; polypectomy was performed with cold forceps 2.   Moderate diverticulosis was noted in the sigmoid colon 3.   The colon mucosa was otherwise normal  RECOMMENDATIONS: Given your significant family history of colon cancer, you should have a repeat colonoscopy in 5 years   eSigned:  Louis Meckel, MD 10/09/2012  9:34 AM   cc: Karie Schwalbe, MD and Zelphia Cairo MD

## 2012-10-09 NOTE — Patient Instructions (Addendum)

## 2012-10-09 NOTE — Progress Notes (Signed)
Patient did not experience any of the following events: a burn prior to discharge; a fall within the facility; wrong site/side/patient/procedure/implant event; or a hospital transfer or hospital admission upon discharge from the facility. (G8907) Patient did not have preoperative order for IV antibiotic SSI prophylaxis. (G8918)  

## 2012-10-10 ENCOUNTER — Telehealth: Payer: Self-pay

## 2012-10-10 NOTE — Telephone Encounter (Signed)
  Follow up Call-  Call back number 10/09/2012  Post procedure Call Back phone  # [phone]  Permission to leave phone message Yes     Patient questions:  Do you have a fever, pain , or abdominal swelling? no Pain Score  0 *  Have you tolerated food without any problems? yes  Have you been able to return to your normal activities? yes  Do you have any questions about your discharge instructions: Diet   no Medications  no Follow up visit  no  Do you have questions or concerns about your Care? no  Actions: * If pain score is 4 or above: No action needed, pain <4.

## 2012-10-19 ENCOUNTER — Encounter: Payer: Self-pay | Admitting: Gastroenterology

## 2012-11-05 ENCOUNTER — Other Ambulatory Visit: Payer: Self-pay | Admitting: Internal Medicine

## 2012-11-09 ENCOUNTER — Encounter: Payer: Self-pay | Admitting: Internal Medicine

## 2012-11-09 ENCOUNTER — Ambulatory Visit (INDEPENDENT_AMBULATORY_CARE_PROVIDER_SITE_OTHER): Payer: Medicare Other | Admitting: Internal Medicine

## 2012-11-09 VITALS — BP 140/82 | HR 54 | Temp 98.7°F | Ht 60.25 in | Wt 114.5 lb

## 2012-11-09 DIAGNOSIS — Z1331 Encounter for screening for depression: Secondary | ICD-10-CM | POA: Diagnosis not present

## 2012-11-09 DIAGNOSIS — K219 Gastro-esophageal reflux disease without esophagitis: Secondary | ICD-10-CM | POA: Diagnosis not present

## 2012-11-09 DIAGNOSIS — I10 Essential (primary) hypertension: Secondary | ICD-10-CM

## 2012-11-09 DIAGNOSIS — M810 Age-related osteoporosis without current pathological fracture: Secondary | ICD-10-CM | POA: Diagnosis not present

## 2012-11-09 DIAGNOSIS — M19049 Primary osteoarthritis, unspecified hand: Secondary | ICD-10-CM | POA: Diagnosis not present

## 2012-11-09 LAB — CBC WITH DIFFERENTIAL/PLATELET
Basophils Absolute: 0.1 10*3/uL (ref 0.0–0.1)
Basophils Relative: 1.3 % (ref 0.0–3.0)
Eosinophils Absolute: 0.1 10*3/uL (ref 0.0–0.7)
Eosinophils Relative: 1.5 % (ref 0.0–5.0)
HCT: 41.3 % (ref 36.0–46.0)
Hemoglobin: 13.8 g/dL (ref 12.0–15.0)
Lymphocytes Relative: 34.9 % (ref 12.0–46.0)
Lymphs Abs: 2.8 10*3/uL (ref 0.7–4.0)
MCHC: 33.5 g/dL (ref 30.0–36.0)
MCV: 90.6 fl (ref 78.0–100.0)
Monocytes Absolute: 0.6 10*3/uL (ref 0.1–1.0)
Monocytes Relative: 7.1 % (ref 3.0–12.0)
Neutro Abs: 4.5 10*3/uL (ref 1.4–7.7)
Neutrophils Relative %: 55.2 % (ref 43.0–77.0)
Platelets: 245 10*3/uL (ref 150.0–400.0)
RBC: 4.56 Mil/uL (ref 3.87–5.11)
RDW: 13.2 % (ref 11.5–14.6)
WBC: 8.1 10*3/uL (ref 4.5–10.5)

## 2012-11-09 LAB — BASIC METABOLIC PANEL
BUN: 14 mg/dL (ref 6–23)
CO2: 30 mEq/L (ref 19–32)
Calcium: 9 mg/dL (ref 8.4–10.5)
Chloride: 104 mEq/L (ref 96–112)
Creatinine, Ser: 0.8 mg/dL (ref 0.4–1.2)
GFR: 71.79 mL/min (ref >60.00)
Glucose, Bld: 88 mg/dL (ref 70–99)
Potassium: 4 mEq/L (ref 3.5–5.1)
Sodium: 139 mEq/L (ref 135–145)

## 2012-11-09 LAB — HEPATIC FUNCTION PANEL
ALT: 21 U/L (ref 0–35)
AST: 17 U/L (ref 0–37)
Albumin: 3.7 g/dL (ref 3.5–5.2)
Alkaline Phosphatase: 59 U/L (ref 39–117)
Bilirubin, Direct: 0 mg/dL (ref 0.0–0.3)
Total Bilirubin: 0.5 mg/dL (ref 0.3–1.2)
Total Protein: 6.2 g/dL (ref 6.0–8.3)

## 2012-11-09 LAB — TSH: TSH: 1.06 u[IU]/mL (ref 0.35–5.50)

## 2012-11-09 MED ORDER — LISINOPRIL 10 MG PO TABS: 10.0000 mg | ORAL_TABLET | Freq: Every day | ORAL | Status: DC

## 2012-11-09 MED ORDER — TRAMADOL HCL 50 MG PO TABS: 50.0000 mg | ORAL_TABLET | Freq: Three times a day (TID) | ORAL | Status: DC | PRN
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
## 2012-11-09 NOTE — Assessment & Plan Note (Signed)
Severe Asked her to take acetaminophen regularly Tramadol up to tid

## 2012-11-09 NOTE — Assessment & Plan Note (Signed)
BP Readings from Last 3 Encounters:  11/09/12 140/82  10/09/12 142/63  10/01/12 132/80   Good control No changes needed Due for labs

## 2012-11-09 NOTE — Assessment & Plan Note (Signed)
Controlled on the PPI 

## 2012-11-09 NOTE — Assessment & Plan Note (Signed)
Will repeat DEXA next year

## 2013-01-10 ENCOUNTER — Other Ambulatory Visit: Payer: Self-pay | Admitting: Family Medicine

## 2013-01-10 ENCOUNTER — Other Ambulatory Visit: Payer: Self-pay | Admitting: Internal Medicine

## 2013-01-11 NOTE — Telephone Encounter (Signed)
Spoke with patient and advised results She will request again next month

## 2013-01-11 NOTE — Telephone Encounter (Signed)
She is not due for a new prescription till 8/20 Check to see what is going on

## 2013-02-07 DIAGNOSIS — Z85828 Personal history of other malignant neoplasm of skin: Secondary | ICD-10-CM | POA: Diagnosis not present

## 2013-02-07 DIAGNOSIS — D239 Other benign neoplasm of skin, unspecified: Secondary | ICD-10-CM | POA: Diagnosis not present

## 2013-02-07 DIAGNOSIS — L570 Actinic keratosis: Secondary | ICD-10-CM | POA: Diagnosis not present

## 2013-02-07 DIAGNOSIS — Z85820 Personal history of malignant melanoma of skin: Secondary | ICD-10-CM | POA: Diagnosis not present

## 2013-02-07 DIAGNOSIS — L821 Other seborrheic keratosis: Secondary | ICD-10-CM | POA: Diagnosis not present

## 2013-03-04 DIAGNOSIS — Z23 Encounter for immunization: Secondary | ICD-10-CM | POA: Diagnosis not present

## 2013-03-21 ENCOUNTER — Other Ambulatory Visit: Payer: Self-pay

## 2013-03-21 MED ORDER — TRAMADOL HCL 50 MG PO TABS: 50.0000 mg | ORAL_TABLET | Freq: Three times a day (TID) | ORAL | Status: DC | PRN

## 2013-03-21 NOTE — Telephone Encounter (Signed)
Pt [facility].Please advise.

## 2013-03-21 NOTE — Telephone Encounter (Signed)
Okay #[phone] x [phone] Will need to print and fax---controlled now

## 2013-03-21 NOTE — Telephone Encounter (Signed)
Spoke with patient and advised rx ready for pick-up and it will be at the front desk.  

## 2013-03-22 ENCOUNTER — Encounter: Payer: Self-pay | Admitting: Internal Medicine

## 2013-03-22 ENCOUNTER — Telehealth: Payer: Self-pay

## 2013-03-22 DIAGNOSIS — Z79899 Other long term (current) drug therapy: Secondary | ICD-10-CM | POA: Diagnosis not present

## 2013-03-22 NOTE — Telephone Encounter (Signed)
Please call her It would be okay to wean off the tramadol (not especially likely to cause any withdrawal anyway but safer to go daily till done and then just stop it. Continue regular tylenol and she should let me know if she wants to consider alternatives

## 2013-03-22 NOTE — Telephone Encounter (Signed)
Pt is concerned about possible side effects of Tramadol; pt wants to stop taking Tramadol and wants to speak with Dr Alphonsus Sias about wiening off medication. Pt has 12 pills left. Pt is not going to get new rx refilled; pt wants to know if needs to bring new Tramadol rx back to office.. Pt wants to know if can take one Tramadol a day for the next 11 days and then stop medication.Please advise.

## 2013-03-22 NOTE — Telephone Encounter (Signed)
Patient advised.

## 2013-04-03 ENCOUNTER — Other Ambulatory Visit: Payer: Self-pay

## 2013-04-03 DIAGNOSIS — Z1231 Encounter for screening mammogram for malignant neoplasm of breast: Secondary | ICD-10-CM

## 2013-04-08 ENCOUNTER — Encounter: Payer: Self-pay | Admitting: Internal Medicine

## 2013-04-26 ENCOUNTER — Ambulatory Visit
Admission: RE | Admit: 2013-04-26 | Discharge: 2013-04-26 | Disposition: A | Discharge status: Home / Self Care | Payer: Medicare Other | Source: Ambulatory Visit

## 2013-04-26 DIAGNOSIS — Z1231 Encounter for screening mammogram for malignant neoplasm of breast: Secondary | ICD-10-CM | POA: Diagnosis not present

## 2013-05-08 DIAGNOSIS — M25519 Pain in unspecified shoulder: Secondary | ICD-10-CM | POA: Diagnosis not present

## 2013-11-12 ENCOUNTER — Ambulatory Visit (INDEPENDENT_AMBULATORY_CARE_PROVIDER_SITE_OTHER): Payer: Medicare Other | Admitting: Internal Medicine

## 2013-11-12 ENCOUNTER — Encounter: Payer: Self-pay | Admitting: Internal Medicine

## 2013-11-12 VITALS — BP 110/68 | HR 69 | Temp 98.1°F | Ht 61.0 in | Wt 122.0 lb

## 2013-11-12 DIAGNOSIS — Z0000 Encounter for general adult medical examination without abnormal findings: Secondary | ICD-10-CM | POA: Diagnosis not present

## 2013-11-12 DIAGNOSIS — Z23 Encounter for immunization: Secondary | ICD-10-CM | POA: Diagnosis not present

## 2013-11-12 DIAGNOSIS — K219 Gastro-esophageal reflux disease without esophagitis: Secondary | ICD-10-CM | POA: Diagnosis not present

## 2013-11-12 DIAGNOSIS — M159 Polyosteoarthritis, unspecified: Secondary | ICD-10-CM | POA: Diagnosis not present

## 2013-11-12 DIAGNOSIS — I10 Essential (primary) hypertension: Secondary | ICD-10-CM | POA: Diagnosis not present

## 2013-11-12 LAB — CBC WITH DIFFERENTIAL/PLATELET
Basophils Absolute: 0.1 10*3/uL (ref 0.0–0.1)
Basophils Relative: 1.7 % (ref 0.0–3.0)
Eosinophils Absolute: 0.3 10*3/uL (ref 0.0–0.7)
Eosinophils Relative: 3.8 % (ref 0.0–5.0)
HCT: 42.5 % (ref 36.0–46.0)
Hemoglobin: 14 g/dL (ref 12.0–15.0)
Lymphocytes Relative: 32.1 % (ref 12.0–46.0)
Lymphs Abs: 2.6 10*3/uL (ref 0.7–4.0)
MCHC: 32.9 g/dL (ref 30.0–36.0)
MCV: 92.6 fl (ref 78.0–100.0)
Monocytes Absolute: 0.5 10*3/uL (ref 0.1–1.0)
Monocytes Relative: 6.1 % (ref 3.0–12.0)
Neutro Abs: 4.5 10*3/uL (ref 1.4–7.7)
Neutrophils Relative %: 56.3 % (ref 43.0–77.0)
Platelets: 242 10*3/uL (ref 150.0–400.0)
RBC: 4.59 Mil/uL (ref 3.87–5.11)
RDW: 12.4 % (ref 11.5–15.5)
WBC: 8.1 10*3/uL (ref 4.0–10.5)

## 2013-11-12 LAB — LIPID PANEL
Cholesterol: 185 mg/dL (ref 0–200)
HDL: 39.9 mg/dL (ref >39.00)
LDL Cholesterol: 118 mg/dL — ABNORMAL HIGH (ref 0–99)
Total CHOL/HDL Ratio: 5
Triglycerides: 134 mg/dL (ref 0.0–149.0)
VLDL: 26.8 mg/dL (ref 0.0–40.0)

## 2013-11-12 LAB — COMPREHENSIVE METABOLIC PANEL
ALT: 23 U/L (ref 0–35)
AST: 21 U/L (ref 0–37)
Albumin: 4.3 g/dL (ref 3.5–5.2)
Alkaline Phosphatase: 56 U/L (ref 39–117)
BUN: 13 mg/dL (ref 6–23)
CO2: 28 mEq/L (ref 19–32)
Calcium: 9.5 mg/dL (ref 8.4–10.5)
Chloride: 106 mEq/L (ref 96–112)
Creatinine, Ser: 0.9 mg/dL (ref 0.4–1.2)
GFR: 69.65 mL/min (ref >60.00)
Glucose, Bld: 100 mg/dL — ABNORMAL HIGH (ref 70–99)
Potassium: 4.6 mEq/L (ref 3.5–5.1)
Sodium: 142 mEq/L (ref 135–145)
Total Bilirubin: 0.5 mg/dL (ref 0.2–1.2)
Total Protein: 6.9 g/dL (ref 6.0–8.3)

## 2013-11-12 LAB — T4, FREE: Free T4: 0.87 ng/dL (ref 0.60–1.60)

## 2013-11-12 LAB — TSH: TSH: 2.43 u[IU]/mL (ref 0.35–4.50)

## 2013-11-12 NOTE — Assessment & Plan Note (Signed)
Controlled with the PPI Takes NSAID also--should continue the acid suppression

## 2013-11-12 NOTE — Progress Notes (Signed)
Pre visit review using our clinic review tool, if applicable. No additional management support is needed unless otherwise documented below in the visit note. 

## 2013-11-12 NOTE — Assessment & Plan Note (Signed)
I have personally reviewed the Medicare Annual Wellness questionnaire and have noted 1. The patient's medical and social history 2. Their use of alcohol, tobacco or illicit drugs 3. Their current medications and supplements 4. The patient's functional ability including ADL's, fall risks, home safety risks and hearing or visual             impairment. 5. Diet and physical activities 6. Evidence for depression or mood disorders  The patients weight, height, BMI and visual acuity have been recorded in the chart I have made referrals, counseling and provided education to the patient based review of the above and I have provided the pt with a written personalized care plan for preventive services.  I have provided you with a copy of your personalized plan for preventive services. Please take the time to review along with your updated medication list.  Done with colonoscopies Will do 1 more mammogram next year--no breast exams prevnar today Yearly flu Discussed exercise

## 2013-11-12 NOTE — Progress Notes (Signed)
Subjective:    Patient ID: Angela Bowen, female    DOB: 06-15-1941, 73 y.o.   MRN: [id number]  HPI Here for Medicare wellness and follow up Form reviewed Med list updated Sees derm and eye doctor No exercise --discussed No tobacco or alcohol Independent with all instrumental ADLs Vision and hearing are okay No cognitive problems   No problems with BP med Usually 140/80 at home No chest pain No SOB No dizziness or syncope No edema  Arthritis in feet, hips and hands Has gone off the tramadol Satisfied with prn naproxen  No heartburn on the omeprazole No swallowing problems  Current Outpatient Prescriptions on File Prior to Visit  Medication Sig Dispense Refill  . calcium carbonate (OS-CAL) 600 MG TABS Take 600 mg by mouth daily.      . cholecalciferol (VITAMIN D) 1000 UNITS tablet Take 1,000 Units by mouth daily.       Marland Kitchen lisinopril (PRINIVIL,ZESTRIL) 10 MG tablet Take 1 tablet (10 mg total) by mouth daily.  90 tablet  3  . Multiple Vitamin (MULTIVITAMIN) tablet Take 1 tablet by mouth daily.        Marland Kitchen omeprazole (PRILOSEC) 40 MG capsule TAKE 1 CAPSULE TWICE A DAY  180 capsule  3  . [DISCONTINUED] Calcium-Magnesium (CALCIUM MAGNESIUM 750) 300-300 MG TABS Take by mouth daily.        No current facility-administered medications on file prior to visit.    Allergies  Allergen Reactions  . Aspirin     High doses - intolerant  . Codeine     REACTION: rash    Past Medical History  Diagnosis Date  . History of skin cancer   . Diverticulosis of colon   . Hypertension   . Osteoarthritis   . GERD (gastroesophageal reflux disease)   . S/P sclerotherapy of varicose veins 07/30/06    left leg, Dr. Eilleen Kempf  . Normal stress echocardiogram 01/2006    echo 5/98, fairly normal  . Osteoporosis, unspecified   . Cancer     stage II melanoma; right leg;face  . Gallstones     Past Surgical History  Procedure Laterality Date  . Varicose vein surgery  1984    bilateral  .  Cholecystectomy  2001  . Varicose vein surgery  2004    right foot  . Melanoma excision  7/10    leg  . Foot surgery      right    Family History  Problem Relation Age of Onset  . Alcohol abuse Mother   . Colon cancer Mother   . Osteoporosis Mother   . Alcohol abuse Father   . Diabetes Father   . Lung cancer Brother   . Ovarian cancer Maternal Grandmother     History   Social History  . Marital Status: Married    Spouse Name: N/A    Number of Children: 2  . Years of Education: N/A   Occupational History  . homemaker    Social History Main Topics  . Smoking status: Never Smoker   . Smokeless tobacco: Never Used  . Alcohol Use: Yes     Comment: Wine, rarely  . Drug Use: No  . Sexual Activity: Not on file   Other Topics Concern  . Not on file   Social History Narrative   Has living will   No designated health care POA--requests husband   Would accept resuscitation attempts   No tube feeds if cognitively unaware   Review of Systems  Bowels are regular Urination is frequent. No incontinence. Nocturia x 1-2 Still sleeps okay Appetite is good. Weight is up a few pounds    Objective:   Physical Exam  Constitutional: She is oriented to person, place, and time. She appears well-developed and well-nourished. No distress.  HENT:  Mouth/Throat: Oropharynx is clear and moist. No oropharyngeal exudate.  Neck: Normal range of motion. Neck supple. No thyromegaly present.  Cardiovascular: Normal rate, regular rhythm, normal heart sounds and intact distal pulses.  Exam reveals no gallop.   No murmur heard. Pulmonary/Chest: Effort normal and breath sounds normal. No respiratory distress. She has no wheezes. She has no rales.  Abdominal: Soft. There is no tenderness.  Musculoskeletal: She exhibits no edema and no tenderness.  Lymphadenopathy:    She has no cervical adenopathy.  Neurological: She is alert and oriented to person, place, and time.  President- "Elyn Peers, [place]" [phone]---- "not good at math" D-l-r-[id number]-w Recall 3/3  Skin: No rash noted. No erythema.  Psychiatric: She has a normal mood and affect. Her behavior is normal.          Assessment & Plan:

## 2013-11-12 NOTE — Patient Instructions (Signed)
Consider joining the Y. You can also get a fitbit to measure your daily activity.  Exercise to Stay Healthy Exercise helps you become and stay healthy. EXERCISE IDEAS AND TIPS Choose exercises that:  You enjoy.  Fit into your day. You do not need to exercise really hard to be healthy. You can do exercises at a slow or medium level and stay healthy. You can:  Stretch before and after working out.  Try yoga, Pilates, or tai chi.  Lift weights.  Walk fast, swim, jog, run, climb stairs, bicycle, dance, or rollerskate.  Take aerobic classes. Exercises that burn about 150 calories:  Running 1  miles in 15 minutes.  Playing volleyball for 45 to 60 minutes.  Washing and waxing a car for 45 to 60 minutes.  Playing touch football for 45 minutes.  Walking 1  miles in 35 minutes.  Pushing a stroller 1  miles in 30 minutes.  Playing basketball for 30 minutes.  Raking leaves for 30 minutes.  Bicycling 5 miles in 30 minutes.  Walking 2 miles in 30 minutes.  Dancing for 30 minutes.  Shoveling snow for 15 minutes.  Swimming laps for 20 minutes.  Walking up stairs for 15 minutes.  Bicycling 4 miles in 15 minutes.  Gardening for 30 to 45 minutes.  Jumping rope for 15 minutes.  Washing windows or floors for 45 to 60 minutes. Document Released: 07/16/2010 Document Revised: 09/05/2011 Document Reviewed: 07/16/2010 [facility] Patient Information 2014 [place], [place].

## 2013-11-12 NOTE — Addendum Note (Signed)
Addended by: Despina Hidden on: 11/12/2013 10:35 AM   Modules accepted: Orders

## 2013-11-12 NOTE — Assessment & Plan Note (Signed)
Especially in hands that have sig nodular changes on PIP and DIP Does okay with the aleve

## 2013-11-12 NOTE — Assessment & Plan Note (Signed)
BP Readings from Last 3 Encounters:  11/12/13 110/68  11/09/12 140/82  10/09/12 142/63   Good control No changes

## 2014-02-04 ENCOUNTER — Telehealth: Payer: Self-pay

## 2014-02-04 ENCOUNTER — Other Ambulatory Visit: Payer: Self-pay | Admitting: Internal Medicine

## 2014-02-04 NOTE — Telephone Encounter (Signed)
Pt left v/m requesting cb at [phone]; that was entire message. Called multiple times and line remains busy. Will try later.

## 2014-02-12 ENCOUNTER — Telehealth: Payer: Self-pay

## 2014-02-12 NOTE — Telephone Encounter (Signed)
error 

## 2014-02-12 NOTE — Telephone Encounter (Signed)
Pt said was seen on 11/12/13 for wellness exam and thought Prevnar would be covered under wellness exam; pt to contact [facility] billing [phone].

## 2014-02-18 DIAGNOSIS — Z961 Presence of intraocular lens: Secondary | ICD-10-CM | POA: Diagnosis not present

## 2014-02-19 DIAGNOSIS — L570 Actinic keratosis: Secondary | ICD-10-CM | POA: Diagnosis not present

## 2014-02-19 DIAGNOSIS — L821 Other seborrheic keratosis: Secondary | ICD-10-CM | POA: Diagnosis not present

## 2014-02-19 DIAGNOSIS — Z85820 Personal history of malignant melanoma of skin: Secondary | ICD-10-CM | POA: Diagnosis not present

## 2014-02-19 DIAGNOSIS — D239 Other benign neoplasm of skin, unspecified: Secondary | ICD-10-CM | POA: Diagnosis not present

## 2014-02-19 DIAGNOSIS — Z85828 Personal history of other malignant neoplasm of skin: Secondary | ICD-10-CM | POA: Diagnosis not present

## 2014-03-13 DIAGNOSIS — Z23 Encounter for immunization: Secondary | ICD-10-CM | POA: Diagnosis not present

## 2014-09-23 ENCOUNTER — Other Ambulatory Visit: Payer: Self-pay | Admitting: Internal Medicine

## 2014-11-14 ENCOUNTER — Encounter: Payer: Self-pay | Admitting: Internal Medicine

## 2014-11-14 ENCOUNTER — Ambulatory Visit (INDEPENDENT_AMBULATORY_CARE_PROVIDER_SITE_OTHER): Payer: Medicare Other | Admitting: Internal Medicine

## 2014-11-14 VITALS — BP 120/70 | HR 61 | Temp 97.7°F | Ht 61.0 in | Wt 122.0 lb

## 2014-11-14 DIAGNOSIS — Z0000 Encounter for general adult medical examination without abnormal findings: Secondary | ICD-10-CM | POA: Diagnosis not present

## 2014-11-14 DIAGNOSIS — I10 Essential (primary) hypertension: Secondary | ICD-10-CM | POA: Diagnosis not present

## 2014-11-14 DIAGNOSIS — Z7189 Other specified counseling: Secondary | ICD-10-CM

## 2014-11-14 DIAGNOSIS — Z23 Encounter for immunization: Secondary | ICD-10-CM | POA: Diagnosis not present

## 2014-11-14 DIAGNOSIS — M810 Age-related osteoporosis without current pathological fracture: Secondary | ICD-10-CM | POA: Diagnosis not present

## 2014-11-14 DIAGNOSIS — E2839 Other primary ovarian failure: Secondary | ICD-10-CM

## 2014-11-14 DIAGNOSIS — M159 Polyosteoarthritis, unspecified: Secondary | ICD-10-CM | POA: Diagnosis not present

## 2014-11-14 DIAGNOSIS — K219 Gastro-esophageal reflux disease without esophagitis: Secondary | ICD-10-CM | POA: Diagnosis not present

## 2014-11-14 LAB — COMPREHENSIVE METABOLIC PANEL
ALT: 21 U/L (ref 0–35)
AST: 19 U/L (ref 0–37)
Albumin: 4.4 g/dL (ref 3.5–5.2)
Alkaline Phosphatase: 67 U/L (ref 39–117)
BUN: 16 mg/dL (ref 6–23)
CO2: 30 mEq/L (ref 19–32)
Calcium: 9.6 mg/dL (ref 8.4–10.5)
Chloride: 105 mEq/L (ref 96–112)
Creatinine, Ser: 0.84 mg/dL (ref 0.40–1.20)
GFR: 70.41 mL/min (ref >60.00)
Glucose, Bld: 98 mg/dL (ref 70–99)
Potassium: 3.7 mEq/L (ref 3.5–5.1)
Sodium: 140 mEq/L (ref 135–145)
Total Bilirubin: 0.4 mg/dL (ref 0.2–1.2)
Total Protein: 7.2 g/dL (ref 6.0–8.3)

## 2014-11-14 LAB — CBC WITH DIFFERENTIAL/PLATELET
Basophils Absolute: 0.1 10*3/uL (ref 0.0–0.1)
Basophils Relative: 1.3 % (ref 0.0–3.0)
Eosinophils Absolute: 0.3 10*3/uL (ref 0.0–0.7)
Eosinophils Relative: 3.4 % (ref 0.0–5.0)
HCT: 43.2 % (ref 36.0–46.0)
Hemoglobin: 14.5 g/dL (ref 12.0–15.0)
Lymphocytes Relative: 31.4 % (ref 12.0–46.0)
Lymphs Abs: 2.5 10*3/uL (ref 0.7–4.0)
MCHC: 33.6 g/dL (ref 30.0–36.0)
MCV: 89.6 fl (ref 78.0–100.0)
Monocytes Absolute: 0.5 10*3/uL (ref 0.1–1.0)
Monocytes Relative: 6.3 % (ref 3.0–12.0)
Neutro Abs: 4.6 10*3/uL (ref 1.4–7.7)
Neutrophils Relative %: 57.6 % (ref 43.0–77.0)
Platelets: 262 10*3/uL (ref 150.0–400.0)
RBC: 4.82 Mil/uL (ref 3.87–5.11)
RDW: 12.5 % (ref 11.5–15.5)
WBC: 7.9 10*3/uL (ref 4.0–10.5)

## 2014-11-14 LAB — T4, FREE: Free T4: 0.86 ng/dL (ref 0.60–1.60)

## 2014-11-14 MED ORDER — OMEPRAZOLE 20 MG PO CPDR: 20.0000 mg | DELAYED_RELEASE_CAPSULE | Freq: Two times a day (BID) | ORAL | Status: DC

## 2014-11-14 NOTE — Assessment & Plan Note (Signed)
Mostly in hands which are nodular in PIP/DIP but not inflamed Uses aleve prn

## 2014-11-14 NOTE — Progress Notes (Signed)
Subjective:    Patient ID: Angela Bowen, female    DOB: 08/02/1940, 74 y.o.   MRN: [id number]  HPI Here for Medicare wellness and follow up of chronic medical problems Reviewed form and advanced directives Reviewed other physicians No alcohol or tobacco No exercise---discussed Vision and hearing are fine Independent with instrumental ADLs No falls No depression or anhedonia No apparent cognitive changes  Concerned about side effects with omeprazole Does better with bid--tried daily and didn't do as well No swallowing problems  No problems with BP med Only occasional headaches No chest pain No SOB No dizziness or syncope No edema  Continues on calcium and vitamin D No regular exercise---discussed  Has borderline osteoporosis on the DEXA 4 years ago  Mild arthritis symptoms still Tries to limit the aleve Mostly in hands  Current Outpatient Prescriptions on File Prior to Visit  Medication Sig Dispense Refill  . calcium carbonate (OS-CAL) 600 MG TABS Take 600 mg by mouth daily.    . cholecalciferol (VITAMIN D) 1000 UNITS tablet Take 1,000 Units by mouth daily.     Marland Kitchen lisinopril (PRINIVIL,ZESTRIL) 10 MG tablet TAKE 1 TABLET DAILY 90 tablet 3  . Multiple Vitamin (MULTIVITAMIN) tablet Take 1 tablet by mouth daily.      . [DISCONTINUED] Calcium-Magnesium (CALCIUM MAGNESIUM 750) 300-300 MG TABS Take by mouth daily.      No current facility-administered medications on file prior to visit.    Allergies  Allergen Reactions  . Aspirin     High doses - intolerant  . Codeine     REACTION: rash    Past Medical History  Diagnosis Date  . History of skin cancer   . Diverticulosis of colon   . Hypertension   . Osteoarthritis   . GERD (gastroesophageal reflux disease)   . S/P sclerotherapy of varicose veins 07/30/06    left leg, Dr. Eilleen Kempf  . Normal stress echocardiogram 01/2006    echo 5/98, fairly normal  . Osteoporosis, unspecified   . Cancer     stage II melanoma;  right leg;face  . Gallstones     Past Surgical History  Procedure Laterality Date  . Varicose vein surgery  1984    bilateral  . Cholecystectomy  2001  . Varicose vein surgery  2004    right foot  . Melanoma excision  7/10    leg  . Foot surgery      right    Family History  Problem Relation Age of Onset  . Alcohol abuse Mother   . Colon cancer Mother   . Osteoporosis Mother   . Alcohol abuse Father   . Diabetes Father   . Lung cancer Brother   . Ovarian cancer Maternal Grandmother     History   Social History  . Marital Status: Married    Spouse Name: N/A  . Number of Children: 2  . Years of Education: N/A   Occupational History  . homemaker    Social History Main Topics  . Smoking status: Never Smoker   . Smokeless tobacco: Never Used  . Alcohol Use: Yes     Comment: Wine, rarely  . Drug Use: No  . Sexual Activity: Not on file   Other Topics Concern  . Not on file   Social History Narrative   Has living will   No designated health care POA--requests husband   Would accept resuscitation attempts   No tube feeds if cognitively unaware   Review of Systems  Appetite is good Weight stable Sleeps well Bowels are fine Some urinary frequency--some urgency. No incontinence Teeth fine--overdue for dentist Wears seat belt     Objective:   Physical Exam  Constitutional: She is oriented to person, place, and time. She appears well-developed and well-nourished. No distress.  Neck: Normal range of motion. Neck supple. No thyromegaly present.  Cardiovascular: Normal rate, regular rhythm, normal heart sounds and intact distal pulses.  Exam reveals no gallop.   No murmur heard. Pulmonary/Chest: Effort normal and breath sounds normal. No respiratory distress. She has no wheezes. She has no rales.  Abdominal: Soft. There is no tenderness.  Musculoskeletal: She exhibits no edema or tenderness.  Lymphadenopathy:    She has no cervical adenopathy.    Neurological: She is alert and oriented to person, place, and time.  President-- "Obama, Bush, Clinton" [phone] [id number] Recall 3/3  Skin: No rash noted. No erythema.  Psychiatric: She has a normal mood and affect. Her behavior is normal.          Assessment & Plan:

## 2014-11-14 NOTE — Assessment & Plan Note (Signed)
I have personally reviewed the Medicare Annual Wellness questionnaire and have noted 1. The patient's medical and social history 2. Their use of alcohol, tobacco or illicit drugs 3. Their current medications and supplements 4. The patient's functional ability including ADL's, fall risks, home safety risks and hearing or visual             impairment. 5. Diet and physical activities 6. Evidence for depression or mood disorders  The patients weight, height, BMI and visual acuity have been recorded in the chart I have made referrals, counseling and provided education to the patient based review of the above and I have provided the pt with a written personalized care plan for preventive services.  I have provided you with a copy of your personalized plan for preventive services. Please take the time to review along with your updated medication list.  Needs Td Will get mammogram this year--- may be last. Prefers no breast exam after discussion Had recent colon--that was the last No pap due to age Discussed exercise

## 2014-11-14 NOTE — Addendum Note (Signed)
Addended by: Carter Kitten on: 11/14/2014 04:02 PM   Modules accepted: Orders

## 2014-11-14 NOTE — Assessment & Plan Note (Signed)
High risk has slim white woman If substantial decline from -2.6--- will start bisphosphonate

## 2014-11-14 NOTE — Assessment & Plan Note (Signed)
Will try to reduce the omeprazole to 20 bid

## 2014-11-14 NOTE — Assessment & Plan Note (Signed)
BP Readings from Last 3 Encounters:  11/14/14 120/70  11/12/13 110/68  11/09/12 140/82   Good control No changes needed

## 2014-11-14 NOTE — Progress Notes (Signed)
Pre visit review using our clinic review tool, if applicable. No additional management support is needed unless otherwise documented below in the visit note. 

## 2014-11-14 NOTE — Assessment & Plan Note (Signed)
See social history Urged her to do POA

## 2014-11-17 NOTE — Addendum Note (Signed)
Addended by: Despina Hidden on: 11/17/2014 08:45 AM   Modules accepted: Orders

## 2014-11-18 ENCOUNTER — Ambulatory Visit
Admission: RE | Admit: 2014-11-18 | Discharge: 2014-11-18 | Disposition: A | Discharge status: Home / Self Care | Payer: Medicare Other | Source: Ambulatory Visit | Attending: Internal Medicine | Admitting: Internal Medicine

## 2014-11-18 DIAGNOSIS — E2839 Other primary ovarian failure: Secondary | ICD-10-CM

## 2014-11-18 DIAGNOSIS — M810 Age-related osteoporosis without current pathological fracture: Secondary | ICD-10-CM | POA: Diagnosis not present

## 2015-02-03 ENCOUNTER — Other Ambulatory Visit: Payer: Self-pay

## 2015-02-03 DIAGNOSIS — Z1231 Encounter for screening mammogram for malignant neoplasm of breast: Secondary | ICD-10-CM

## 2015-03-17 ENCOUNTER — Ambulatory Visit
Admission: RE | Admit: 2015-03-17 | Discharge: 2015-03-17 | Disposition: A | Discharge status: Home / Self Care | Payer: Medicare Other | Source: Ambulatory Visit

## 2015-03-17 DIAGNOSIS — Z1231 Encounter for screening mammogram for malignant neoplasm of breast: Secondary | ICD-10-CM

## 2015-03-19 DIAGNOSIS — W57XXXA Bitten or stung by nonvenomous insect and other nonvenomous arthropods, initial encounter: Secondary | ICD-10-CM | POA: Diagnosis not present

## 2015-03-19 DIAGNOSIS — Z85820 Personal history of malignant melanoma of skin: Secondary | ICD-10-CM | POA: Diagnosis not present

## 2015-03-19 DIAGNOSIS — D225 Melanocytic nevi of trunk: Secondary | ICD-10-CM | POA: Diagnosis not present

## 2015-03-19 DIAGNOSIS — Z85828 Personal history of other malignant neoplasm of skin: Secondary | ICD-10-CM | POA: Diagnosis not present

## 2015-03-19 DIAGNOSIS — Z86018 Personal history of other benign neoplasm: Secondary | ICD-10-CM | POA: Diagnosis not present

## 2015-03-19 DIAGNOSIS — S90862A Insect bite (nonvenomous), left foot, initial encounter: Secondary | ICD-10-CM | POA: Diagnosis not present

## 2015-03-19 DIAGNOSIS — L821 Other seborrheic keratosis: Secondary | ICD-10-CM | POA: Diagnosis not present

## 2015-03-30 ENCOUNTER — Ambulatory Visit (INDEPENDENT_AMBULATORY_CARE_PROVIDER_SITE_OTHER): Payer: Medicare Other | Admitting: Podiatry

## 2015-03-30 ENCOUNTER — Encounter: Payer: Self-pay | Admitting: Podiatry

## 2015-03-30 ENCOUNTER — Ambulatory Visit (INDEPENDENT_AMBULATORY_CARE_PROVIDER_SITE_OTHER): Payer: Medicare Other

## 2015-03-30 VITALS — BP 151/71 | HR 58 | Resp 16

## 2015-03-30 DIAGNOSIS — M779 Enthesopathy, unspecified: Secondary | ICD-10-CM | POA: Diagnosis not present

## 2015-03-30 DIAGNOSIS — M722 Plantar fascial fibromatosis: Secondary | ICD-10-CM | POA: Diagnosis not present

## 2015-03-30 NOTE — Progress Notes (Signed)
She presents today with a chief complaint of a painful right foot. She states that anytime she walks up an incline she notices a sharp shooting feeling right and here as she points to the subtalar joint of her right foot. She states that it makes me to like him on a collapse. She denies any trauma to it other than a tennis injury many years ago. She states that this happens occasionally is not an everyday thing when it happens is very sore. She states that is easy to walk off and then she is able to get back to her regular routine. She presents today with a pair of very thin tennis shoes.  Objective: I have reviewed her past medical history medications allergies surgery social history and review of systems. Pulses are strongly palpable bilateral. Neurologic sensorium is intact per Semmes-Weinstein monofilament. Deep tendon reflexes are intact bilateral and muscle strength +5 over 5 dorsiflexion plantar flexors and inverters everters alter the musculatures intact. Orthopedic evaluation does demonstrate erectus foot type right foot. Osteoarthritic changes of the midfoot and the subtalar joint or resulting in some midfoot collapse and has some subtalar joint capsulitis and arthritis. She has pain on palpation of the sinus tarsi of the right foot today she also has pain on sharp eversion of the subtalar joint today. Left foot demonstrates a much lower arch than the  right. radiographs demonstrate 3 views subtalar joint capsulitis and midfoot osteoarthritis.  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  Plan: Discussed etiology pathology conservative versus surgical therapies. I injected the subtalar joint today after sterile Betadine skin prep. She tolerated the procedure well.  [facility] DPM.

## 2015-04-25 ENCOUNTER — Encounter (HOSPITAL_COMMUNITY): Payer: Self-pay | Admitting: Neurology

## 2015-04-25 ENCOUNTER — Emergency Department (HOSPITAL_COMMUNITY): Payer: Medicare Other

## 2015-04-25 ENCOUNTER — Emergency Department (HOSPITAL_COMMUNITY)
Admission: EM | Admit: 2015-04-25 | Discharge: 2015-04-25 | Disposition: A | Discharge status: Home / Self Care | Payer: Medicare Other | Attending: Emergency Medicine | Admitting: Emergency Medicine

## 2015-04-25 DIAGNOSIS — R0602 Shortness of breath: Secondary | ICD-10-CM | POA: Diagnosis not present

## 2015-04-25 DIAGNOSIS — R21 Rash and other nonspecific skin eruption: Secondary | ICD-10-CM

## 2015-04-25 DIAGNOSIS — I10 Essential (primary) hypertension: Secondary | ICD-10-CM | POA: Diagnosis not present

## 2015-04-25 DIAGNOSIS — M1990 Unspecified osteoarthritis, unspecified site: Secondary | ICD-10-CM | POA: Diagnosis not present

## 2015-04-25 DIAGNOSIS — Z85828 Personal history of other malignant neoplasm of skin: Secondary | ICD-10-CM | POA: Diagnosis not present

## 2015-04-25 DIAGNOSIS — M810 Age-related osteoporosis without current pathological fracture: Secondary | ICD-10-CM | POA: Insufficient documentation

## 2015-04-25 DIAGNOSIS — R42 Dizziness and giddiness: Secondary | ICD-10-CM | POA: Diagnosis not present

## 2015-04-25 DIAGNOSIS — R55 Syncope and collapse: Secondary | ICD-10-CM | POA: Diagnosis present

## 2015-04-25 DIAGNOSIS — K5900 Constipation, unspecified: Secondary | ICD-10-CM | POA: Insufficient documentation

## 2015-04-25 DIAGNOSIS — Z79899 Other long term (current) drug therapy: Secondary | ICD-10-CM | POA: Insufficient documentation

## 2015-04-25 DIAGNOSIS — R112 Nausea with vomiting, unspecified: Secondary | ICD-10-CM | POA: Diagnosis not present

## 2015-04-25 DIAGNOSIS — K219 Gastro-esophageal reflux disease without esophagitis: Secondary | ICD-10-CM | POA: Diagnosis not present

## 2015-04-25 DIAGNOSIS — R531 Weakness: Secondary | ICD-10-CM | POA: Diagnosis not present

## 2015-04-25 DIAGNOSIS — R404 Transient alteration of awareness: Secondary | ICD-10-CM | POA: Diagnosis not present

## 2015-04-25 LAB — BASIC METABOLIC PANEL
Anion gap: 10 (ref 5–15)
BUN: 21 mg/dL — ABNORMAL HIGH (ref 6–20)
CO2: 21 mmol/L — ABNORMAL LOW (ref 22–32)
Calcium: 8.6 mg/dL — ABNORMAL LOW (ref 8.9–10.3)
Chloride: 108 mmol/L (ref 101–111)
Creatinine, Ser: 0.86 mg/dL (ref 0.44–1.00)
GFR calc Af Amer: >60 mL/min (ref >60)
GFR calc non Af Amer: >60 mL/min (ref >60)
Glucose, Bld: 134 mg/dL — ABNORMAL HIGH (ref 65–99)
Potassium: 4.2 mmol/L (ref 3.5–5.1)
Sodium: 139 mmol/L (ref 135–145)

## 2015-04-25 LAB — CBC
HCT: 40.3 % (ref 36.0–46.0)
Hemoglobin: 13.7 g/dL (ref 12.0–15.0)
MCH: 30.9 pg (ref 26.0–34.0)
MCHC: 34 g/dL (ref 30.0–36.0)
MCV: 91 fL (ref 78.0–100.0)
Platelets: 200 10*3/uL (ref 150–400)
RBC: 4.43 MIL/uL (ref 3.87–5.11)
RDW: 12.4 % (ref 11.5–15.5)
WBC: 11.2 10*3/uL — ABNORMAL HIGH (ref 4.0–10.5)

## 2015-04-25 LAB — CBG MONITORING, ED: Glucose-Capillary: 129 mg/dL — ABNORMAL HIGH (ref 65–99)

## 2015-04-25 LAB — I-STAT TROPONIN, ED
Troponin i, poc: 0.01 ng/mL (ref 0.00–0.08)
Troponin i, poc: 0.01 ng/mL (ref 0.00–0.08)

## 2015-04-25 MED ORDER — PREDNISONE 20 MG PO TABS
60.0000 mg | ORAL_TABLET | Freq: Once | ORAL | Status: AC
  Administered 2015-04-25: 60 mg via ORAL
  Filled 2015-04-25: qty 3

## 2015-04-25 MED ORDER — EPINEPHRINE 0.3 MG/0.3ML IJ SOAJ: 0.3000 mg | Freq: Once | INTRAMUSCULAR | Status: DC

## 2015-04-25 MED ORDER — PREDNISONE 10 MG PO TABS: 40.0000 mg | ORAL_TABLET | Freq: Every day | ORAL | Status: DC

## 2015-04-25 NOTE — ED Notes (Signed)
Pt. Is aware of needing urine for a urine specimen

## 2015-04-25 NOTE — Discharge Instructions (Signed)
Take Benadryl 25mg  Every 6 hours Take Zantac (ranitidine) 150mg  once a day

## 2015-04-25 NOTE — ED Notes (Signed)
Pt.'s rash has spread to her back area.  She has it on her forehead, lower abdomen and now her back.  She denies any itching or pain presently

## 2015-04-25 NOTE — ED Provider Notes (Signed)
CSN: [id number]     Arrival date & time 04/25/15  1017 History   First MD Initiated Contact with Patient 04/25/15 0914     Chief Complaint  Patient presents with  . Near Syncope     (Consider location/radiation/quality/duration/timing/severity/associated sxs/prior Treatment) HPI Comments: 330AM began itching, rash all over body Trying to have BM, felt lightheaded, husband assisted her to floor Similar episodes in past Was told previously Allergic to all red meats, evaluated at [place], last 59yr Ate meat last night Worked all day yesterday in yard No bites/stings +SOB after lightheaded, no wheezing, no chest pain, no throat swelling Nausea, emesis Anaphylactic reactions before No SOB/lightheadedness now Hx of melanoma   Patient is a 74 y.o. female presenting with near-syncope.  Near Syncope Associated symptoms include shortness of breath. Pertinent negatives include no chest pain, no abdominal pain and no headaches.    Past Medical History  Diagnosis Date  . History of skin cancer   . Diverticulosis of colon   . Hypertension   . Osteoarthritis   . GERD (gastroesophageal reflux disease)   . S/P sclerotherapy of varicose veins 07/30/06    left leg, Dr. Eilleen Kempf  . Normal stress echocardiogram 01/2006    echo 5/98, fairly normal  . Osteoporosis, unspecified   . Cancer ([place])     stage II melanoma; right leg;face  . Gallstones    Past Surgical History  Procedure Laterality Date  . Varicose vein surgery  1984    bilateral  . Cholecystectomy  2001  . Varicose vein surgery  2004    right foot  . Melanoma excision  7/10    leg  . Foot surgery      right   Family History  Problem Relation Age of Onset  . Alcohol abuse Mother   . Colon cancer Mother   . Osteoporosis Mother   . Alcohol abuse Father   . Diabetes Father   . Lung cancer Brother   . Ovarian cancer Maternal Grandmother    Social History  Substance Use Topics  . Smoking status: Never Smoker   .  Smokeless tobacco: Never Used  . Alcohol Use: No     Comment: Wine, rarely   OB History    No data available     Review of Systems  Constitutional: Negative for fever and appetite change.  HENT: Negative for sore throat.   Eyes: Negative for visual disturbance.  Respiratory: Positive for shortness of breath. Negative for cough.   Cardiovascular: Positive for near-syncope. Negative for chest pain.  Gastrointestinal: Positive for nausea, vomiting and constipation. Negative for abdominal pain, diarrhea and blood in stool.  Genitourinary: Negative for dysuria and difficulty urinating.  Musculoskeletal: Negative for back pain and neck pain.  Skin: Negative for rash.  Neurological: Positive for light-headedness. Negative for syncope, facial asymmetry, weakness, numbness and headaches.      Allergies  Aspirin and Codeine  Home Medications   Prior to Admission medications   Medication Sig Start Date End Date Taking? Authorizing Provider  acetaminophen (TYLENOL) 500 MG tablet Take 500 mg by mouth every 6 (six) hours as needed (pain).   Yes Historical Provider, MD  calcium carbonate (OS-CAL) 600 MG TABS Take 600 mg by mouth daily.   Yes Historical Provider, MD  cholecalciferol (VITAMIN D) 1000 UNITS tablet Take 1,000 Units by mouth daily.    Yes Historical Provider, MD  lisinopril (PRINIVIL,ZESTRIL) 10 MG tablet TAKE 1 TABLET DAILY 09/23/14  Yes Venia Carbon, MD  Multiple Vitamin (MULTIVITAMIN) tablet Take 1 tablet by mouth daily.     Yes Historical Provider, MD  naproxen sodium (ANAPROX) 220 MG tablet Take 440 mg by mouth daily as needed (pain).   Yes Historical Provider, MD  Omega-3 Fatty Acids (FISH OIL PO) Take by mouth.   Yes Historical Provider, MD  omeprazole (PRILOSEC) 20 MG capsule Take 1 capsule (20 mg total) by mouth 2 (two) times daily before a meal. 11/14/14  Yes Venia Carbon, MD  EPINEPHrine 0.3 mg/0.3 mL IJ SOAJ injection Inject 0.3 mLs (0.3 mg total) into the muscle  once. 04/25/15   Gareth Morgan, MD  predniSONE (DELTASONE) 10 MG tablet Take 4 tablets (40 mg total) by mouth daily. 04/25/15   Gareth Morgan, MD   BP 126/47 mmHg  Pulse 77  Temp(Src) 97.4 F (36.3 C) (Oral)  Resp 20  SpO2 97% Physical Exam  Constitutional: She is oriented to person, place, and time. She appears well-developed and well-nourished. No distress.  HENT:  Head: Normocephalic and atraumatic.  Eyes: Conjunctivae and EOM are normal.  Neck: Normal range of motion.  Cardiovascular: Normal rate, regular rhythm, normal heart sounds and intact distal pulses.  Exam reveals no gallop and no friction rub.   No murmur heard. Pulmonary/Chest: Effort normal and breath sounds normal. No respiratory distress. She has no wheezes. She has no rales.  Abdominal: Soft. She exhibits no distension. There is no tenderness. There is no guarding.  Musculoskeletal: She exhibits no edema or tenderness.  Neurological: She is alert and oriented to person, place, and time.  Skin: Skin is warm and dry. Rash (erythematous patches, raised, over back, forehead, feet, hands) noted. She is not diaphoretic. There is erythema.  Nursing note and vitals reviewed.   ED Course  Procedures (including critical care time) Labs Review Labs Reviewed  BASIC METABOLIC PANEL - Abnormal; Notable for the following:    CO2 21 (*)    Glucose, Bld 134 (*)    BUN 21 (*)    Calcium 8.6 (*)    All other components within normal limits  CBC - Abnormal; Notable for the following:    WBC 11.2 (*)    All other components within normal limits  CBG MONITORING, ED - Abnormal; Notable for the following:    Glucose-Capillary 129 (*)    All other components within normal limits  I-STAT TROPOININ, ED  Randolm Idol, ED    Imaging Review Dg Chest 2 View  04/25/2015  CLINICAL DATA:  Shortness of breath.  Acute weakness. EXAM: CHEST  2 VIEW COMPARISON:  None. FINDINGS: Heart size and pulmonary vascularity are normal and  the lungs are clear. Calcification in the arch of the aorta. No osseous abnormality. IMPRESSION: No acute abnormality.  Aortic atherosclerosis. Electronically Signed   By: Lorriane Shire M.D.   On: 04/25/2015 10:40   I have personally reviewed and evaluated these images and lab results as part of my medical decision-making.   EKG Interpretation   Date/Time:  Saturday April 25 2015 09:18:50 EDT Ventricular Rate:  61 PR Interval:  167 QRS Duration: 91 QT Interval:  429 QTC Calculation: 432 R Axis:   11 Text Interpretation:  Sinus rhythm Anteroseptal infarct, age indeterminate  Nonspecific TW abnormalities, diffuse No prior EKGs for comparison  Confirmed by [facility] MD, [place] ([id number]) on 04/25/2015 11:29:34 AM      MDM   Final diagnoses:  Lightheadedness  Rash    74yo female with history of hypertension, prior episodes of  erythematous rash/lightheadedness, anaphylaxis, presents with concern for pruritic erythematous rash and lightheadedness.  DDx for lightheadedness includes anaphylaxis, cardiac arrhythmia, MI, PE, electrolyte abnormality, hypovolemia including dehydration and anemia/GI bleed, infection.   EKG evaluated by me and shows sinus rhythm with no sign of prolonged QTc, no brugada, no sign of HOCM, no acute ST abnormalities.  Doubt MI, negative delta troponins, no chest pain.  Described brief episode of SOB, however was not persistent, and have low susipcion for PE. CXR without acute findings.  Electrolytes WNL.  CBC shows no sign of anemia.  No other neurologic symptoms to suggest stroke. Symptoms improved after IVF, and given episode occurred while having BM may indicate situational lightheadedness.    Rash does not have the appearance of SSS, TEN, erythroderma, SJS or cellulitis.  Rash pruritic and appears to be allergic.  While patient describes symptoms at home that may have represented anaphylaxis, including dyspnea, nausea and lightheadedness, she has not received  epinephrine and all other symptoms have resolved, vital signs are stable, and have low suspicion for anaphylaxis. Patient observed for hours in the ED without return of symptoms.  She reports history of similar episodes, however reports it has been many years since she had an episode, and reports episodes are associated typically with meat.  Discussed avoiding possible triggers, following up with PCP and allergist and return for worsening symptoms. Will schedule benadryl every 6hr, and take zantac. Wrote rx for prednisone and epipen in case of anaphylaxis. Patient discharged in stable condition with understanding of reasons to return.    Gareth Morgan, MD 04/25/15 2201

## 2015-04-25 NOTE — ED Notes (Signed)
Pt placed into gown and on to monitor upon arrival to room. Pt monitored by blood pressure, pulse ox, and 12 lead.

## 2015-04-25 NOTE — ED Notes (Signed)
Per ems- this morning at 0700 was in the bathroom trying to have a BM, when she felt weak, her husband came into the bathroom and assisted her to the floor. When EMS arrived she was sitting upright, alert and talking. C/o generalized weakness but thinks this is due to working in the yard yesterday. She woke up this morning at 0400 with rash to her forehead, abdomen and feet. Reports hx of same rx in past and was told likely due to meat. Pt reports she thinks her rx is to additives in meat which she had yesterday. Pt denies pain at current. Is a x 4. EMS gave 50 benadryl, 50 zantac, 4 mg zofran. BP sitting 160/90, standing 120/90. CBG 159.

## 2015-04-25 NOTE — ED Notes (Addendum)
Pt encouraged to provide urine specimen. Pt remains monitored by blood pressure, pulse ox, and 5 lead. Pts family remains at bedside.

## 2015-04-28 ENCOUNTER — Telehealth: Payer: Self-pay | Admitting: *Deleted

## 2015-04-28 NOTE — Telephone Encounter (Signed)
Spoke with patient after ED visit and she feels much better, they recommended she see an allergist, pt will call when she's ready for that appt.

## 2015-08-03 ENCOUNTER — Encounter: Payer: Self-pay | Admitting: Podiatry

## 2015-08-03 ENCOUNTER — Ambulatory Visit (INDEPENDENT_AMBULATORY_CARE_PROVIDER_SITE_OTHER): Payer: Medicare Other | Admitting: Podiatry

## 2015-08-03 VITALS — BP 146/70 | HR 57 | Resp 16

## 2015-08-03 DIAGNOSIS — M779 Enthesopathy, unspecified: Secondary | ICD-10-CM

## 2015-08-03 MED ORDER — MELOXICAM 7.5 MG PO TABS: 7.5000 mg | ORAL_TABLET | Freq: Every day | ORAL | Status: DC

## 2015-08-03 NOTE — Progress Notes (Signed)
She presents today for follow-up of her subtalar joint capsulitis and osteoarthritis of her rear foot and midfoot. She states this seems to be hurting worse and I have not injured.  Objective: Vital signs are stable she is alert and oriented 3. She has pain on inversion and eversion of the subtalar joint with strong palpable pulses. I reviewed her old radiographs today which do demonstrate osteoarthritic changes of the subtalar joint. Pulses are palpable no major changes in physical exam.  Assessment: Chronic subtalar joint rear foot osteoarthritis.  Plan: Started her on a nonsteroidal anti-inflammatory and reinjected the subtalar joint today meloxicam 7.5 mg 1 by mouth daily.

## 2015-09-14 ENCOUNTER — Ambulatory Visit: Payer: Medicare Other | Admitting: Podiatry

## 2015-09-16 ENCOUNTER — Other Ambulatory Visit: Payer: Self-pay | Admitting: Internal Medicine

## 2015-10-20 ENCOUNTER — Other Ambulatory Visit: Payer: Self-pay | Admitting: Internal Medicine

## 2015-11-17 ENCOUNTER — Encounter: Payer: Self-pay | Admitting: Internal Medicine

## 2015-11-17 ENCOUNTER — Ambulatory Visit (INDEPENDENT_AMBULATORY_CARE_PROVIDER_SITE_OTHER): Payer: Medicare Other

## 2015-11-17 ENCOUNTER — Ambulatory Visit (INDEPENDENT_AMBULATORY_CARE_PROVIDER_SITE_OTHER): Payer: Medicare Other | Admitting: Internal Medicine

## 2015-11-17 VITALS — BP 150/70 | HR 64 | Temp 98.3°F | Ht 60.25 in | Wt 118.8 lb

## 2015-11-17 DIAGNOSIS — I10 Essential (primary) hypertension: Secondary | ICD-10-CM | POA: Diagnosis not present

## 2015-11-17 DIAGNOSIS — Z0000 Encounter for general adult medical examination without abnormal findings: Secondary | ICD-10-CM | POA: Diagnosis not present

## 2015-11-17 DIAGNOSIS — K219 Gastro-esophageal reflux disease without esophagitis: Secondary | ICD-10-CM | POA: Diagnosis not present

## 2015-11-17 LAB — COMPREHENSIVE METABOLIC PANEL
ALT: 39 U/L — ABNORMAL HIGH (ref 0–35)
AST: 30 U/L (ref 0–37)
Albumin: 4.4 g/dL (ref 3.5–5.2)
Alkaline Phosphatase: 52 U/L (ref 39–117)
BUN: 11 mg/dL (ref 6–23)
CO2: 30 mEq/L (ref 19–32)
Calcium: 9.6 mg/dL (ref 8.4–10.5)
Chloride: 106 mEq/L (ref 96–112)
Creatinine, Ser: 0.82 mg/dL (ref 0.40–1.20)
GFR: 72.2 mL/min (ref >60.00)
Glucose, Bld: 96 mg/dL (ref 70–99)
Potassium: 4.4 mEq/L (ref 3.5–5.1)
Sodium: 142 mEq/L (ref 135–145)
Total Bilirubin: 0.4 mg/dL (ref 0.2–1.2)
Total Protein: 6.7 g/dL (ref 6.0–8.3)

## 2015-11-17 LAB — LIPID PANEL
Cholesterol: 183 mg/dL (ref 0–200)
HDL: 34.8 mg/dL — ABNORMAL LOW (ref >39.00)
LDL Cholesterol: 125 mg/dL — ABNORMAL HIGH (ref 0–99)
NonHDL: 148.16
Total CHOL/HDL Ratio: 5
Triglycerides: 114 mg/dL (ref 0.0–149.0)
VLDL: 22.8 mg/dL (ref 0.0–40.0)

## 2015-11-17 MED ORDER — LISINOPRIL-HYDROCHLOROTHIAZIDE 20-12.5 MG PO TABS: 1.0000 | ORAL_TABLET | Freq: Every day | ORAL | Status: DC

## 2015-11-17 NOTE — Progress Notes (Signed)
Subjective:    Patient ID: Angela Bowen, female    DOB: [birth year], 75 y.o.   MRN: [id number]  HPI Here for follow up of chronic health issues  Has had some trouble with foot---seeing Dr Milinda Pointer Then had bad reaction when she took 2 aleve PM--had tolerated 1 at a time  Doesn't regularly check BP Usually 140/80 though No headache No chest pain No SOB No exercise--discussed. Tried to walk and that prompted podiatry visits (got cortisone shots which helped but then it recurred) Going to try starting chair exercises  Still on daily omeprazole No sig heartburn on this No dysphagia or cough   Current Outpatient Prescriptions on File Prior to Visit  Medication Sig Dispense Refill  . acetaminophen (TYLENOL) 500 MG tablet Take 500 mg by mouth every 6 (six) hours as needed (pain).    . calcium carbonate (OS-CAL) 600 MG TABS Take 600 mg by mouth daily.    . cholecalciferol (VITAMIN D) 1000 UNITS tablet Take 1,000 Units by mouth daily.     Marland Kitchen EPINEPHrine 0.3 mg/0.3 mL IJ SOAJ injection Inject 0.3 mLs (0.3 mg total) into the muscle once. 2 Device 0  . lisinopril (PRINIVIL,ZESTRIL) 10 MG tablet TAKE 1 TABLET DAILY 90 tablet 0  . Multiple Vitamin (MULTIVITAMIN) tablet Take 1 tablet by mouth daily.      . Omega-3 Fatty Acids (FISH OIL PO) Take by mouth.    Marland Kitchen omeprazole (PRILOSEC) 20 MG capsule TAKE 1 CAPSULE TWICE A DAY BEFORE MEALS 180 capsule 0  . [DISCONTINUED] Calcium-Magnesium (CALCIUM MAGNESIUM 750) 300-300 MG TABS Take by mouth daily.      No current facility-administered medications on file prior to visit.    Allergies  Allergen Reactions  . Nsaids Anaphylaxis  . Aspirin     High doses - intolerant  . Codeine     REACTION: rash    Past Medical History  Diagnosis Date  . History of skin cancer   . Diverticulosis of colon   . Hypertension   . Osteoarthritis   . GERD (gastroesophageal reflux disease)   . S/P sclerotherapy of varicose veins 07/30/06    left leg, Dr. Eilleen Kempf   . Normal stress echocardiogram 01/2006    echo 5/98, fairly normal  . Osteoporosis, unspecified   . Cancer ([place])     stage II melanoma; right leg;face  . Gallstones     Past Surgical History  Procedure Laterality Date  . Varicose vein surgery  1984    bilateral  . Cholecystectomy  2001  . Varicose vein surgery  2004    right foot  . Melanoma excision  7/10    leg  . Foot surgery      right    Family History  Problem Relation Age of Onset  . Alcohol abuse Mother   . Colon cancer Mother   . Osteoporosis Mother   . Alcohol abuse Father   . Diabetes Father   . Lung cancer Brother   . Ovarian cancer Maternal Grandmother     Social History   Social History  . Marital Status: Married    Spouse Name: N/A  . Number of Children: 2  . Years of Education: N/A   Occupational History  . homemaker    Social History Main Topics  . Smoking status: Never Smoker   . Smokeless tobacco: Never Used  . Alcohol Use: No     Comment: Wine, rarely  . Drug Use: No  . Sexual Activity: No  Other Topics Concern  . Not on file   Social History Narrative   Has living will   No designated health care POA--requests husband   Would accept resuscitation attempts   No tube feeds if cognitively unaware   Review of Systems Not sleeping well lately--mind goes (thinking of selling house, etc) Appetite is fine Weight is stable    Objective:   Physical Exam  Constitutional: She appears well-developed and well-nourished. No distress.  HENT:  Mouth/Throat: Oropharynx is clear and moist. No oropharyngeal exudate.  Neck: Normal range of motion. Neck supple. No thyromegaly present.  Cardiovascular: Normal rate, regular rhythm, normal heart sounds and intact distal pulses.  Exam reveals no gallop.   No murmur heard. Pulmonary/Chest: Effort normal and breath sounds normal. No respiratory distress. She has no wheezes. She has no rales.  Musculoskeletal: She exhibits no edema or tenderness.    Lymphadenopathy:    She has no cervical adenopathy.  Skin: No rash noted.  Psychiatric: She has a normal mood and affect. Her behavior is normal.          Assessment & Plan:

## 2015-11-17 NOTE — Progress Notes (Signed)
Pre visit review using our clinic review tool, if applicable. No additional management support is needed unless otherwise documented below in the visit note. 

## 2015-11-17 NOTE — Patient Instructions (Signed)
Please increase to 2 lisinopril (total 20mg ) daily till the new prescription comes in.

## 2015-11-17 NOTE — Progress Notes (Signed)
PCP notes:  Health maintenance: No gaps identified or addressed.  Abnormal screenings:   Hearing - failed. Please assess patient at next appt.   Patient concerns: None  Nurse concerns: BP is elevated. 150/70. Please assess patient at next appt.   Next PCP appt: 11/17/15 @ 1115 (scheduled; same day)

## 2015-11-17 NOTE — Progress Notes (Signed)
   Subjective:    Patient ID: Angela Bowen, female    DOB: 1941-04-26, 75 y.o.   MRN: [id number]  HPI I reviewed health advisor's note, was available for consultation, and agree with documentation and plan.    Review of Systems     Objective:   Physical Exam        Assessment & Plan:

## 2015-11-17 NOTE — Assessment & Plan Note (Signed)
Controlled with PPI

## 2015-11-17 NOTE — Assessment & Plan Note (Signed)
BP Readings from Last 3 Encounters:  11/17/15 150/70  11/17/15 150/70  08/03/15 146/70   Will increase lisinopril and add HCTZ Recheck 6 weeks or so

## 2015-11-17 NOTE — Progress Notes (Signed)
Subjective:   Angela Bowen is a 75 y.o. female who presents for Medicare Annual (Subsequent) preventive examination.  Review of Systems:  N/A Cardiac Risk Factors include: advanced age (>41men, >54 women);hypertension     Objective:     Vitals: BP 150/70 mmHg  Pulse 64  Temp(Src) 98.3 F (36.8 C) (Oral)  Ht 5' 0.25" (1.53 m)  Wt 118 lb 12 oz (53.865 kg)  BMI 23.01 kg/m2  SpO2 97%  Body mass index is 23.01 kg/(m^2).   Tobacco History  Smoking status  . Never Smoker   Smokeless tobacco  . Never Used     Counseling given: No   Past Medical History  Diagnosis Date  . History of skin cancer   . Diverticulosis of colon   . Hypertension   . Osteoarthritis   . GERD (gastroesophageal reflux disease)   . S/P sclerotherapy of varicose veins 07/30/06    left leg, Dr. Eilleen Kempf  . Normal stress echocardiogram 01/2006    echo 5/98, fairly normal  . Osteoporosis, unspecified   . Cancer ([place])     stage II melanoma; right leg;face  . Gallstones    Past Surgical History  Procedure Laterality Date  . Varicose vein surgery  1984    bilateral  . Cholecystectomy  2001  . Varicose vein surgery  2004    right foot  . Melanoma excision  7/10    leg  . Foot surgery      right   Family History  Problem Relation Age of Onset  . Alcohol abuse Mother   . Colon cancer Mother   . Osteoporosis Mother   . Alcohol abuse Father   . Diabetes Father   . Lung cancer Brother   . Ovarian cancer Maternal Grandmother    History  Sexual Activity  . Sexual Activity: No    Outpatient Encounter Prescriptions as of 11/17/2015  Medication Sig  . acetaminophen (TYLENOL) 500 MG tablet Take 500 mg by mouth every 6 (six) hours as needed (pain).  . calcium carbonate (OS-CAL) 600 MG TABS Take 600 mg by mouth daily.  . cholecalciferol (VITAMIN D) 1000 UNITS tablet Take 1,000 Units by mouth daily.   Marland Kitchen EPINEPHrine 0.3 mg/0.3 mL IJ SOAJ injection Inject 0.3 mLs (0.3 mg total) into the muscle  once.  Marland Kitchen lisinopril (PRINIVIL,ZESTRIL) 10 MG tablet TAKE 1 TABLET DAILY  . Multiple Vitamin (MULTIVITAMIN) tablet Take 1 tablet by mouth daily.    . Omega-3 Fatty Acids (FISH OIL PO) Take by mouth.  Marland Kitchen omeprazole (PRILOSEC) 20 MG capsule TAKE 1 CAPSULE TWICE A DAY BEFORE MEALS  . [DISCONTINUED] meloxicam (MOBIC) 7.5 MG tablet Take 1 tablet (7.5 mg total) by mouth daily.  . [DISCONTINUED] naproxen sodium (ANAPROX) 220 MG tablet Take 440 mg by mouth daily as needed (pain).  . [DISCONTINUED] predniSONE (DELTASONE) 10 MG tablet Take 4 tablets (40 mg total) by mouth daily.   No facility-administered encounter medications on file as of 11/17/2015.    Activities of Daily Living In your present state of health, do you have any difficulty performing the following activities: 11/17/2015  Hearing? N  Vision? N  Difficulty concentrating or making decisions? N  Walking or climbing stairs? N  Dressing or bathing? N  Doing errands, shopping? N  Preparing Food and eating ? N  Using the Toilet? N  In the past six months, have you accidently leaked urine? N  Do you have problems with loss of bowel control? N  Managing  your Medications? N  Managing your Finances? N  Housekeeping or managing your Housekeeping? N    Patient Care Team: Venia Carbon, MD as PCP - General Crista Luria, MD as Consulting Physician (Dermatology)    Assessment:     Hearing Screening   125Hz  250Hz  500Hz  1000Hz  2000Hz  4000Hz  8000Hz   Right ear:   40 0 40 0   Left ear:   40 0 40 0   Vision Screening Comments: Last eye exam in February 10, 2014 with Dr. Chong Sicilian. Pt sees eye doc every 2 yrs.    Exercise Activities and Dietary recommendations Current Exercise Habits: The patient does not participate in regular exercise at present, Exercise limited by: orthopedic condition(s) (pt has osteoporosis and arthritis)  Goals    . Increase physical activity     Starting 11/17/15, I will do at least 15 min of chair exercises daily.         Fall Risk Fall Risk  11/17/2015 11/17/2015 11/14/2014 11/12/2013 11/09/2012  Falls in the past year? No No No No Yes  Number falls in past yr: - - - - 1  Injury with Fall? - - - - No   Depression Screen PHQ 2/9 Scores 11/17/2015 11/17/2015 11/14/2014 11/12/2013  PHQ - 2 Score 0 0 0 0     Cognitive Testing MMSE - Mini Mental State Exam 11/17/2015  Orientation to time 5  Orientation to Place 5  Registration 3  Attention/ Calculation 0  Recall 3  Language- name 2 objects 0  Language- repeat 1  Language- follow 3 step command 3  Language- read & follow direction 0  Write a sentence 0  Copy design 0  Total score 20   PLEASE NOTE: A Mini-Cog screen was completed. Maximum score is 20. A value of 0 denotes this part of [place] MMSE was not completed or the patient failed this part of the Mini-Cog screening.   Mini-Cog Screening Orientation to Time - Max 5 pts Orientation to Place - Max 5 pts Registration - Max 3 pts Recall - Max 3 pts Language Repeat - Max 1 pts Language Follow 3 Step Command - Max 3 pts   Immunization History  Administered Date(s) Administered  . Influenza Split 03/08/2011, 03/24/2011, 03/07/2012  . Influenza-Unspecified 03/04/2013, 03/13/2014  . Pneumococcal Conjugate-13 11/12/2013  . Pneumococcal Polysaccharide-23 01/25/2006  . Td 06/27/2000, 11/14/2014  . Zoster 12/30/2010   Screening Tests Health Maintenance  Topic Date Due  . INFLUENZA VACCINE  01/26/2016  . COLONOSCOPY  10/09/2017  . TETANUS/TDAP  11/13/2024  . DEXA SCAN  Completed  . ZOSTAVAX  Completed  . PNA vac Low Risk Adult  Completed      Plan:     I have personally reviewed and addressed the Medicare Annual Wellness questionnaire and have noted the following in the patient's chart:  A. Medical and social history B. Use of alcohol, tobacco or illicit drugs  C. Current medications and supplements D. Functional ability and status E.  Nutritional status F.  Physical  activity G. Advance directives H. List of other physicians I.  Hospitalizations, surgeries, and ER visits in previous 12 months J.  [place] to include hearing, vision, cognitive, depression L. Referrals and appointments - none  In addition, I have reviewed and discussed with patient certain preventive protocols, quality metrics, and best practice recommendations. A written personalized care plan for preventive services as well as general preventive health recommendations were provided to patient.  See attached scanned questionnaire for  additional information.   Signed,   Lindell Noe, MHA, BS, LPN Health Advisor

## 2015-11-17 NOTE — Patient Instructions (Signed)
Angela Bowen , Thank you for taking time to come for your Medicare Wellness Visit. I appreciate your ongoing commitment to your health goals. Please review the following plan we discussed and let me know if I can assist you in the future.   These are the goals we discussed: Goals    . Increase physical activity     Starting 11/17/15, I will do at least 15 min of chair exercises daily.        This is a list of the screening recommended for you and due dates:  Health Maintenance  Topic Date Due  . Flu Shot  01/26/2016  . Colon Cancer Screening  10/09/2017  . Tetanus Vaccine  11/13/2024  . DEXA scan (bone density measurement)  Completed  . Shingles Vaccine  Completed  . Pneumonia vaccines  Completed    Preventive Care for Adults  A healthy lifestyle and preventive care can promote health and wellness. Preventive health guidelines for adults include the following key practices.  . A routine yearly physical is a good way to check with your health care provider about your health and preventive screening. It is a chance to share any concerns and updates on your health and to receive a thorough exam.  . Visit your dentist for a routine exam and preventive care every 6 months. Brush your teeth twice a day and floss once a day. Good oral hygiene prevents tooth decay and gum disease.  . The frequency of eye exams is based on your age, health, family medical history, use  of contact lenses, and other factors. Follow your health care provider's ecommendations for frequency of eye exams.  . Eat a healthy diet. Foods like vegetables, fruits, whole grains, low-fat dairy products, and lean protein foods contain the nutrients you need without too many calories. Decrease your intake of foods high in solid fats, added sugars, and salt. Eat the right amount of calories for you. Get information about a proper diet from your health care provider, if necessary.  . Regular physical exercise is one of the  most important things you can do for your health. Most adults should get at least 150 minutes of moderate-intensity exercise (any activity that increases your heart rate and causes you to sweat) each week. In addition, most adults need muscle-strengthening exercises on 2 or more days a week.  Silver Sneakers may be a benefit available to you. To determine eligibility, you may visit the website: www.silversneakers.com or contact program at (478)643-9096 Mon-Fri between 8AM-8PM.   . Maintain a healthy weight. The body mass index (BMI) is a screening tool to identify possible weight problems. It provides an estimate of body fat based on height and weight. Your health care provider can find your BMI and can help you achieve or maintain a healthy weight.   For adults 20 years and older: ? A BMI below 18.5 is considered underweight. ? A BMI of 18.5 to 24.9 is normal. ? A BMI of 25 to 29.9 is considered overweight. ? A BMI of 30 and above is considered obese.   . Maintain normal blood lipids and cholesterol levels by exercising and minimizing your intake of saturated fat. Eat a balanced diet with plenty of fruit and vegetables. Blood tests for lipids and cholesterol should begin at age 17 and be repeated every 5 years. If your lipid or cholesterol levels are high, you are over 50, or you are at high risk for heart disease, you may need your cholesterol levels  checked more frequently. Ongoing high lipid and cholesterol levels should be treated with medicines if diet and exercise are not working.  . If you smoke, find out from your health care provider how to quit. If you do not use tobacco, please do not start.  . If you choose to drink alcohol, please do not consume more than 2 drinks per day. One drink is considered to be 12 ounces (355 mL) of beer, 5 ounces (148 mL) of wine, or 1.5 ounces (44 mL) of liquor.  . If you are 36-16 years old, ask your health care provider if you should take aspirin to  prevent strokes.  . Use sunscreen. Apply sunscreen liberally and repeatedly throughout the day. You should seek shade when your shadow is shorter than you. Protect yourself by wearing long sleeves, pants, a wide-brimmed hat, and sunglasses year round, whenever you are outdoors.  . Once a month, do a whole body skin exam, using a mirror to look at the skin on your back. Tell your health care provider of new moles, moles that have irregular borders, moles that are larger than a pencil eraser, or moles that have changed in shape or color.

## 2015-11-20 ENCOUNTER — Encounter: Payer: Medicare Other | Admitting: Internal Medicine

## 2015-11-26 ENCOUNTER — Encounter: Payer: Medicare Other | Admitting: Internal Medicine

## 2016-01-18 ENCOUNTER — Other Ambulatory Visit: Payer: Self-pay | Admitting: Internal Medicine

## 2016-02-09 ENCOUNTER — Other Ambulatory Visit: Payer: Self-pay | Admitting: Internal Medicine

## 2016-02-09 DIAGNOSIS — Z1231 Encounter for screening mammogram for malignant neoplasm of breast: Secondary | ICD-10-CM

## 2016-03-14 ENCOUNTER — Encounter: Payer: Self-pay | Admitting: Podiatry

## 2016-03-14 ENCOUNTER — Ambulatory Visit (INDEPENDENT_AMBULATORY_CARE_PROVIDER_SITE_OTHER): Payer: Medicare Other | Admitting: Podiatry

## 2016-03-14 ENCOUNTER — Ambulatory Visit: Payer: Medicare Other | Admitting: Podiatry

## 2016-03-14 DIAGNOSIS — M779 Enthesopathy, unspecified: Secondary | ICD-10-CM | POA: Diagnosis not present

## 2016-03-14 NOTE — Progress Notes (Signed)
She presents today for follow-up of her capsulitis and subtalar joint right foot. She states it is just so excruciating that I don't know what to do. I really don't like having injections because it seems to be worse after the injections wear off.  Objective: Vital signs are stable she is alert and oriented 3. She is starting to hold her foot in a valgus position rather than neutral position because of the pain in the subtalar joint. She has pain on palpation of the medial aspect of the subtalar joint as well as the sinus tarsi area of the right foot. Radiographs demonstrate significant osteoarthritic changes.  Assessment severe osteoarthritis midfoot.  Plan: I'm requesting an MRI of the subtalar joint and of the tendons distal to that area. I will follow up with her for surgical consideration. May need to consider a [place] brace.

## 2016-03-18 ENCOUNTER — Ambulatory Visit
Admission: RE | Admit: 2016-03-18 | Discharge: 2016-03-18 | Disposition: A | Discharge status: Home / Self Care | Payer: Medicare Other | Source: Ambulatory Visit | Attending: Internal Medicine | Admitting: Internal Medicine

## 2016-03-18 DIAGNOSIS — Z1231 Encounter for screening mammogram for malignant neoplasm of breast: Secondary | ICD-10-CM | POA: Diagnosis not present

## 2016-03-25 ENCOUNTER — Ambulatory Visit
Admission: RE | Admit: 2016-03-25 | Discharge: 2016-03-25 | Disposition: A | Discharge status: Home / Self Care | Payer: Medicare Other | Source: Ambulatory Visit | Attending: Podiatry | Admitting: Podiatry

## 2016-03-25 DIAGNOSIS — M779 Enthesopathy, unspecified: Secondary | ICD-10-CM

## 2016-03-25 DIAGNOSIS — M19071 Primary osteoarthritis, right ankle and foot: Secondary | ICD-10-CM | POA: Diagnosis not present

## 2016-03-30 DIAGNOSIS — L814 Other melanin hyperpigmentation: Secondary | ICD-10-CM | POA: Diagnosis not present

## 2016-03-30 DIAGNOSIS — D1800 Hemangioma unspecified site: Secondary | ICD-10-CM | POA: Diagnosis not present

## 2016-03-30 DIAGNOSIS — Z85820 Personal history of malignant melanoma of skin: Secondary | ICD-10-CM | POA: Diagnosis not present

## 2016-03-30 DIAGNOSIS — Z85828 Personal history of other malignant neoplasm of skin: Secondary | ICD-10-CM | POA: Diagnosis not present

## 2016-03-30 DIAGNOSIS — Z86018 Personal history of other benign neoplasm: Secondary | ICD-10-CM | POA: Diagnosis not present

## 2016-03-30 DIAGNOSIS — D225 Melanocytic nevi of trunk: Secondary | ICD-10-CM | POA: Diagnosis not present

## 2016-03-30 DIAGNOSIS — L821 Other seborrheic keratosis: Secondary | ICD-10-CM | POA: Diagnosis not present

## 2016-03-31 ENCOUNTER — Telehealth: Payer: Self-pay | Admitting: *Deleted

## 2016-03-31 NOTE — Telephone Encounter (Addendum)
-----   Message from Garrel Ridgel, [place] sent at 03/30/2016  7:15 AM EDT ----- Send to dr Saric or evans for surgical consideration and inform patient of this.  Thanks. Informed pt of Dr. Stephenie Acres recommendation and transferred pt to schedulers to switch appt date and schedule with Dr. Jacqualyn Posey.

## 2016-04-07 ENCOUNTER — Encounter: Payer: Self-pay | Admitting: Podiatry

## 2016-04-07 ENCOUNTER — Ambulatory Visit (INDEPENDENT_AMBULATORY_CARE_PROVIDER_SITE_OTHER): Payer: Medicare Other | Admitting: Podiatry

## 2016-04-07 DIAGNOSIS — R262 Difficulty in walking, not elsewhere classified: Secondary | ICD-10-CM

## 2016-04-07 DIAGNOSIS — M19071 Primary osteoarthritis, right ankle and foot: Secondary | ICD-10-CM

## 2016-04-07 DIAGNOSIS — M779 Enthesopathy, unspecified: Secondary | ICD-10-CM | POA: Diagnosis not present

## 2016-04-07 DIAGNOSIS — M79671 Pain in right foot: Secondary | ICD-10-CM

## 2016-04-11 ENCOUNTER — Ambulatory Visit: Payer: Medicare Other | Admitting: Podiatry

## 2016-04-14 NOTE — Progress Notes (Signed)
Subjective: 75 year old female presents the office today with her husband for follow-up evaluation of right foot and ankle pain discuss MRI results. She is elicited ongoing for quite some time she feels that she has a sharp pain in her foot and she feels that her foot is going to collapse. She's had no significant treatment for this side other than injections and anti-inflammatories. The injections do help for short amount of time for the pain does come back. She'll do have more definitive treatment for this. She denies any significant injury or trauma to the foot recently. Denies any systemic complaints such as fevers, chills, nausea, vomiting. No acute changes since last appointment, and no other complaints at this time.   Objective: AAO x3, NAD DP/PT pulses palpable bilaterally, CRT less than 3 seconds There is a decrease in medial arch height upon weightbearing. There is pain with ankle, subtalar joint range of motion as well as submetatarsal range of motion. There is localized edema to these areas without any erythema or increase in warmth. There is crepitation with range of motion of subtalar joint. There is tendons on the sinus tarsi of the right foot. There is no specific area pinpoint bony tenderness or pain the vibratory sensation. No open lesions or pre-ulcerative lesions.  No pain with calf compression, swelling, warmth, erythema  Assessment: Arthritic changes present ankle, subtalar, midtarsal joint as well as tendinitis  Plan: -All treatment options discussed with the patient including all alternatives, risks, complications.  -MRI results were discussed with the patient. I discussed both conservative and surgical treatment options. At this time I recommended her to start with a custom AFO brace. I would have her follow up with [facility] for bracing options. I recommended her to bring the shoes that she typically wears with her and she is concerned about the brace is not fitting in her shoes.  Discussed with her surgical intervention however at this point I would start with bracing to see how she does. -Patient encouraged to call the office with any questions, concerns, change in symptoms.   IMPRESSION: Tibiotalar, subtalar and midfoot degenerative changes and joint effusions. This in combination with fairly significant tenosynovitis could suggest an inflammatory arthropathy such as rheumatoid arthritis.  No stress fracture or osteochondral lesion.  Moderate Achilles tendinopathy.  The medial and lateral ankle ligaments and tendons are intact.  Celesta Gentile, DPM

## 2016-04-27 ENCOUNTER — Other Ambulatory Visit: Payer: Medicare Other

## 2016-04-27 ENCOUNTER — Ambulatory Visit (INDEPENDENT_AMBULATORY_CARE_PROVIDER_SITE_OTHER): Payer: Medicare Other | Admitting: *Deleted

## 2016-04-27 DIAGNOSIS — M779 Enthesopathy, unspecified: Secondary | ICD-10-CM

## 2016-04-27 DIAGNOSIS — M19071 Primary osteoarthritis, right ankle and foot: Secondary | ICD-10-CM

## 2016-04-29 NOTE — Progress Notes (Signed)
Betha casted patient for AFO brace today. Will send mold out and pt will follow up in 4 weeks for dispensing.

## 2016-05-02 ENCOUNTER — Telehealth: Payer: Self-pay | Admitting: Internal Medicine

## 2016-05-02 NOTE — Telephone Encounter (Signed)
Noted in chart   Thank you

## 2016-05-02 NOTE — Telephone Encounter (Signed)
Pt got flu shot on February 27, 2016 at [facility] in [place]   No call back needed  Thanks

## 2016-06-01 ENCOUNTER — Ambulatory Visit (INDEPENDENT_AMBULATORY_CARE_PROVIDER_SITE_OTHER): Payer: Medicare Other | Admitting: *Deleted

## 2016-06-01 DIAGNOSIS — R262 Difficulty in walking, not elsewhere classified: Secondary | ICD-10-CM

## 2016-06-01 DIAGNOSIS — M779 Enthesopathy, unspecified: Secondary | ICD-10-CM

## 2016-06-01 NOTE — Progress Notes (Signed)
Patient presents to pick up [place]. Fit was satisfactory. Follow up as needed.

## 2016-06-30 ENCOUNTER — Ambulatory Visit: Payer: Medicare Other | Admitting: Podiatry

## 2016-08-31 ENCOUNTER — Ambulatory Visit (INDEPENDENT_AMBULATORY_CARE_PROVIDER_SITE_OTHER): Payer: Medicare Other | Admitting: Primary Care

## 2016-08-31 ENCOUNTER — Encounter: Payer: Self-pay | Admitting: Primary Care

## 2016-08-31 VITALS — BP 146/72 | HR 55 | Temp 97.6°F | Ht 60.25 in | Wt 122.1 lb

## 2016-08-31 DIAGNOSIS — R197 Diarrhea, unspecified: Secondary | ICD-10-CM

## 2016-08-31 LAB — COMPREHENSIVE METABOLIC PANEL
ALT: 23 U/L (ref 0–35)
AST: 22 U/L (ref 0–37)
Albumin: 4 g/dL (ref 3.5–5.2)
Alkaline Phosphatase: 57 U/L (ref 39–117)
BUN: 12 mg/dL (ref 6–23)
CO2: 31 mEq/L (ref 19–32)
Calcium: 9.5 mg/dL (ref 8.4–10.5)
Chloride: 103 mEq/L (ref 96–112)
Creatinine, Ser: 0.83 mg/dL (ref 0.40–1.20)
GFR: 71.04 mL/min (ref >60.00)
Glucose, Bld: 93 mg/dL (ref 70–99)
Potassium: 4.1 mEq/L (ref 3.5–5.1)
Sodium: 140 mEq/L (ref 135–145)
Total Bilirubin: 0.4 mg/dL (ref 0.2–1.2)
Total Protein: 6.3 g/dL (ref 6.0–8.3)

## 2016-08-31 LAB — CBC WITH DIFFERENTIAL/PLATELET
Basophils Absolute: 0.2 10*3/uL — ABNORMAL HIGH (ref 0.0–0.1)
Basophils Relative: 2.2 % (ref 0.0–3.0)
Eosinophils Absolute: 0.2 10*3/uL (ref 0.0–0.7)
Eosinophils Relative: 3.3 % (ref 0.0–5.0)
HCT: 39.9 % (ref 36.0–46.0)
Hemoglobin: 13.7 g/dL (ref 12.0–15.0)
Lymphocytes Relative: 35.7 % (ref 12.0–46.0)
Lymphs Abs: 2.5 10*3/uL (ref 0.7–4.0)
MCHC: 34.4 g/dL (ref 30.0–36.0)
MCV: 89.4 fl (ref 78.0–100.0)
Monocytes Absolute: 0.6 10*3/uL (ref 0.1–1.0)
Monocytes Relative: 8.8 % (ref 3.0–12.0)
Neutro Abs: 3.5 10*3/uL (ref 1.4–7.7)
Neutrophils Relative %: 50 % (ref 43.0–77.0)
Platelets: 263 10*3/uL (ref 150.0–400.0)
RBC: 4.46 Mil/uL (ref 3.87–5.11)
RDW: 12.2 % (ref 11.5–15.5)
WBC: 7 10*3/uL (ref 4.0–10.5)

## 2016-08-31 MED ORDER — DIPHENOXYLATE-ATROPINE 2.5-0.025 MG PO TABS: 1.0000 | ORAL_TABLET | Freq: Four times a day (QID) | ORAL | 0 refills | Status: DC | PRN

## 2016-08-31 NOTE — Patient Instructions (Addendum)
Complete lab work prior to leaving today. I will notify you of your results once received.   You may take the Lomotil tablets every 6 hours as needed for diarrhea/loose stools.   Continue to stay hydrated with water. Take a look at the information below regarding your diet.  I will be in touch soon! Please call me sooner if you start experiencing abdominal pain, nausea/voming, bloody stools, fevers.  It was a pleasure meeting you!   Food Choices to Help Relieve Diarrhea, Adult When you have diarrhea, the foods you eat and your eating habits are very important. Choosing the right foods and drinks can help:  Relieve diarrhea.  Replace lost fluids and nutrients.  Prevent dehydration. What general guidelines should I follow? Relieving diarrhea   Choose foods with less than 2 g or .07 oz. of fiber per serving.  Limit fats to less than 8 tsp (38 g or 1.34 oz.) a day.  Avoid the following:  Foods and beverages sweetened with high-fructose corn syrup, honey, or sugar alcohols such as xylitol, sorbitol, and mannitol.  Foods that contain a lot of fat or sugar.  Fried, greasy, or spicy foods.  High-fiber grains, breads, and cereals.  Raw fruits and vegetables.  Eat foods that are rich in probiotics. These foods include dairy products such as yogurt and fermented milk products. They help increase healthy bacteria in the stomach and intestines (gastrointestinal tract, or GI tract).  If you have lactose intolerance, avoid dairy products. These may make your diarrhea worse.  Take medicine to help stop diarrhea (antidiarrheal medicine) only as told by your health care provider. Replacing nutrients   Eat small meals or snacks every 3-4 hours.  Eat bland foods, such as white rice, toast, or baked potato, until your diarrhea starts to get better. Gradually reintroduce nutrient-rich foods as tolerated or as told by your health care provider. This includes:  Well-cooked protein  foods.  Peeled, seeded, and soft-cooked fruits and vegetables.  Low-fat dairy products.  Take vitamin and mineral supplements as told by your health care provider. Preventing dehydration    Start by sipping water or a special solution to prevent dehydration (oral rehydration solution, ORS). Urine that is clear or pale yellow means that you are getting enough fluid.  Try to drink at least 8-10 cups of fluid each day to help replace lost fluids.  You may add other liquids in addition to water, such as clear juice or decaffeinated sports drinks, as tolerated or as told by your health care provider.  Avoid drinks with caffeine, such as coffee, tea, or soft drinks.  Avoid alcohol. What foods are recommended? The items listed may not be a complete list. Talk with your health care provider about what dietary choices are best for you. Grains  White rice. White, [place], or pita breads (fresh or toasted), including plain rolls, buns, or bagels. White pasta. Saltine, soda, or graham crackers. Pretzels. Low-fiber cereal. Cooked cereals made with water (such as cornmeal, farina, or cream cereals). Plain muffins. Matzo. Melba toast. Zwieback. Vegetables  Potatoes (without the skin). Most well-cooked and canned vegetables without skins or seeds. Tender lettuce. Fruits  Apple sauce. Fruits canned in juice. Cooked apricots, cherries, grapefruit, peaches, pears, or plums. Fresh bananas and cantaloupe. Meats and other protein foods  Baked or boiled chicken. Eggs. Tofu. Fish. Seafood. Smooth nut butters. Ground or well-cooked tender beef, ham, veal, lamb, pork, or poultry. Dairy  Plain yogurt, kefir, and unsweetened liquid yogurt. Lactose-free milk, buttermilk, skim  milk, or soy milk. Low-fat or nonfat hard cheese. Beverages  Water. Low-calorie sports drinks. Fruit juices without pulp. Strained tomato and vegetable juices. Decaffeinated teas. Sugar-free beverages not sweetened with sugar alcohols. Oral  rehydration solutions, if approved by your health care provider. Seasoning and other foods  Bouillon, broth, or soups made from recommended foods. What foods are not recommended? The items listed may not be a complete list. Talk with your health care provider about what dietary choices are best for you. Grains  Whole grain, whole wheat, bran, or rye breads, rolls, pastas, and crackers. Wild or brown rice. Whole grain or bran cereals. Barley. Oats and oatmeal. Corn tortillas or taco shells. Granola. Popcorn. Vegetables  Raw vegetables. Fried vegetables. Cabbage, broccoli, Brussels sprouts, artichokes, baked beans, beet greens, corn, kale, legumes, peas, sweet potatoes, and yams. Potato skins. Cooked spinach and cabbage. Fruits  Dried fruit, including raisins and dates. Raw fruits. Stewed or dried prunes. Canned fruits with syrup. Meat and other protein foods  Fried or fatty meats. Deli meats. Chunky nut butters. Nuts and seeds. Beans and lentils. Berniece Salines. Hot dogs. Sausage. Dairy  High-fat cheeses. Whole milk, chocolate milk, and beverages made with milk, such as milk shakes. Half-and-half. Cream. sour cream. Ice cream. Beverages  Caffeinated beverages (such as coffee, tea, soda, or energy drinks). Alcoholic beverages. Fruit juices with pulp. Prune juice. Soft drinks sweetened with high-fructose corn syrup or sugar alcohols. High-calorie sports drinks. Fats and oils  Butter. Cream sauces. Margarine. Salad oils. Plain salad dressings. Olives. Avocados. Mayonnaise. Sweets and desserts  Sweet rolls, doughnuts, and sweet breads. Sugar-free desserts sweetened with sugar alcohols such as xylitol and sorbitol. Seasoning and other foods  Honey. Hot sauce. Chili powder. Gravy. Cream-based or milk-based soups. Pancakes and waffles. Summary  When you have diarrhea, the foods you eat and your eating habits are very important.  Make sure you get at least 8-10 cups of fluid each day, or enough to keep your  urine clear or pale yellow.  Eat bland foods and gradually reintroduce healthy, nutrient-rich foods as tolerated, or as told by your health care provider.  Avoid high-fiber, fried, greasy, or spicy foods. This information is not intended to replace advice given to you by your health care provider. Make sure you discuss any questions you have with your health care provider. Document Released: 09/03/2003 Document Revised: 06/10/2016 Document Reviewed: 06/10/2016 Elsevier Interactive Patient Education  2017 [employer].

## 2016-08-31 NOTE — Progress Notes (Signed)
Subjective:    Patient ID: Angela Bowen, female    DOB: 10/05/1940, 76 y.o.   MRN: [id number]  HPI  Angela Bowen is a 76 year old female with a history of GERD, diverticulosis who presents today with a chief complaint of diarrhea. She also reports feeling bloated and more tired. Her symptoms have been present for the past 2 week. She's experiencing about 5-10 episodes of diarrhea daily. She's been taking Pepto Bismol and Imodium without improvement. She denies abdominal pain, bloody stools, fevers, chills, recent antibiotic use, recent travel. She denies changes in medications, foods, stress, etc. She has a good appetite and is drinking enough water. Overall she's had no improvement and is about the same.   Review of Systems  Constitutional: Negative for fatigue and fever.  Respiratory: Negative for shortness of breath.   Cardiovascular: Negative for chest pain.  Gastrointestinal: Positive for diarrhea. Negative for abdominal pain, nausea and vomiting.  Neurological: Negative for weakness.       Past Medical History:  Diagnosis Date  . Cancer ([place])    stage II melanoma; right leg;face  . Diverticulosis of colon   . Gallstones   . GERD (gastroesophageal reflux disease)   . History of skin cancer   . Hypertension   . Normal stress echocardiogram 01/2006   echo 5/98, fairly normal  . Osteoarthritis   . Osteoporosis, unspecified   . S/P sclerotherapy of varicose veins 07/30/06   left leg, Dr. Eilleen Kempf     Social History   Social History  . Marital status: Married    Spouse name: N/A  . Number of children: 2  . Years of education: N/A   Occupational History  . homemaker    Social History Main Topics  . Smoking status: Never Smoker  . Smokeless tobacco: Never Used  . Alcohol use No     Comment: Wine, rarely  . Drug use: No  . Sexual activity: No   Other Topics Concern  . Not on file   Social History Narrative   Has living will   No designated health care  POA--requests husband   Would accept resuscitation attempts   No tube feeds if cognitively unaware    Past Surgical History:  Procedure Laterality Date  . CHOLECYSTECTOMY  2001  . FOOT SURGERY     right  . MELANOMA EXCISION  7/10   leg  . VARICOSE VEIN SURGERY  1984   bilateral  . VARICOSE VEIN SURGERY  2004   right foot    Family History  Problem Relation Age of Onset  . Alcohol abuse Mother   . Colon cancer Mother   . Osteoporosis Mother   . Alcohol abuse Father   . Diabetes Father   . Lung cancer Brother   . Ovarian cancer Maternal Grandmother     Allergies  Allergen Reactions  . Nsaids Anaphylaxis  . Aspirin     High doses - intolerant  . Codeine     REACTION: rash    Current Outpatient Prescriptions on File Prior to Visit  Medication Sig Dispense Refill  . calcium carbonate (OS-CAL) 600 MG TABS Take 600 mg by mouth daily.    . cholecalciferol (VITAMIN D) 1000 UNITS tablet Take 1,000 Units by mouth daily.     Marland Kitchen EPINEPHrine 0.3 mg/0.3 mL IJ SOAJ injection Inject 0.3 mLs (0.3 mg total) into the muscle once. 2 Device 0  . lisinopril-hydrochlorothiazide (PRINZIDE,ZESTORETIC) 20-12.5 MG tablet Take 1 tablet by mouth daily. [place]  tablet 3  . Multiple Vitamin (MULTIVITAMIN) tablet Take 1 tablet by mouth daily.      . Omega-3 Fatty Acids (FISH OIL PO) Take by mouth.    Marland Kitchen omeprazole (PRILOSEC) 20 MG capsule TAKE 1 CAPSULE TWICE A DAY BEFORE MEALS 180 capsule 2  . acetaminophen (TYLENOL) 500 MG tablet Take 500 mg by mouth every 6 (six) hours as needed (pain).    . [DISCONTINUED] Calcium-Magnesium (CALCIUM MAGNESIUM 750) 300-300 MG TABS Take by mouth daily.      No current facility-administered medications on file prior to visit.     BP (!) 146/72   Pulse (!) 55   Temp 97.6 F (36.4 C) (Oral)   Ht 5' 0.25" (1.53 m)   Wt 122 lb 1.9 oz (55.4 kg)   SpO2 97%   BMI 23.65 kg/m    Objective:   Physical Exam  Constitutional: She appears well-nourished. She does not  appear ill.  Neck: Neck supple.  Cardiovascular: Normal rate and regular rhythm.   Pulmonary/Chest: Effort normal and breath sounds normal.  Abdominal: Soft. Bowel sounds are increased. There is no tenderness.  Skin: Skin is warm and dry.          Assessment & Plan:  Diarrhea:  Present for the past 2 weeks, no improvement with OTC treatment. No abdominal pain, vomiting, changes in meds/foods. Exam today unremarkable, non tender, does not appear acutely ill. Given duration of symptoms without improvement, will obtain labs. CBC, CMP, Stool cultures pending. Rx for Lomotil provided with instructions for use. Discussed BRAT diet. Continue to hydrate. Strict return precautions provided.  Sheral Flow, NP

## 2016-08-31 NOTE — Addendum Note (Signed)
Addended by: Ellamae Sia on: 08/31/2016 04:30 PM   Modules accepted: Orders

## 2016-08-31 NOTE — Progress Notes (Signed)
Pre visit review using our clinic review tool, if applicable. No additional management support is needed unless otherwise documented below in the visit note. 

## 2016-09-01 DIAGNOSIS — R197 Diarrhea, unspecified: Secondary | ICD-10-CM | POA: Diagnosis not present

## 2016-09-01 NOTE — Addendum Note (Signed)
Addended by: Ellamae Sia on: 09/01/2016 03:32 PM   Modules accepted: Orders

## 2016-09-05 LAB — STOOL CULTURE

## 2016-09-07 ENCOUNTER — Telehealth: Payer: Self-pay

## 2016-09-07 NOTE — Telephone Encounter (Signed)
Schedule patient with DR Silvio Pate on 09/08/2016

## 2016-09-07 NOTE — Telephone Encounter (Signed)
Pt left v/m; pt seen 08/31/16; pt has had diarrhea for 3 weeks; pt was given lomotil but pt is no better. Now pt has developed a rash on forehead. [facility] [address] Pt request cb.

## 2016-09-07 NOTE — Telephone Encounter (Signed)
Since no improvement she needs re-evaluation. Please set her up for an office visit for tomorrow if possible.

## 2016-09-08 ENCOUNTER — Encounter: Payer: Self-pay | Admitting: Internal Medicine

## 2016-09-08 ENCOUNTER — Ambulatory Visit (INDEPENDENT_AMBULATORY_CARE_PROVIDER_SITE_OTHER): Payer: Medicare Other | Admitting: Internal Medicine

## 2016-09-08 VITALS — BP 126/68 | HR 66 | Temp 97.0°F | Wt 120.2 lb

## 2016-09-08 DIAGNOSIS — B019 Varicella without complication: Secondary | ICD-10-CM

## 2016-09-08 DIAGNOSIS — B029 Zoster without complications: Secondary | ICD-10-CM

## 2016-09-08 DIAGNOSIS — R197 Diarrhea, unspecified: Secondary | ICD-10-CM | POA: Insufficient documentation

## 2016-09-08 MED ORDER — VALACYCLOVIR HCL 1 G PO TABS: 1000.0000 mg | ORAL_TABLET | Freq: Three times a day (TID) | ORAL | 1 refills | Status: DC

## 2016-09-08 NOTE — Progress Notes (Signed)
   Subjective:    Patient ID: Angela Bowen, female    DOB: 1940/10/22, 76 y.o.   MRN: [id number]  HPI Here for follow up of diarrhea  Ongoing problems Has tried imodium, lomotil, pepto bismol and something else. None of these has helped Having 6 or more watery stools daily Going on for 4-5 weeks Appetite is gone--hard to eat Has tried pedialyte  No abdominal pain No blood in stool Has lost weight  Review of Systems No fever No cough or breathing problems Has rash on right temple--worried about shingles (started 4 days ago) Drinking lots of water No dizziness    Objective:   Physical Exam  Constitutional: She appears well-nourished. No distress.  HENT:  Vesicular rash in V1---not involving eye  Pulmonary/Chest: Effort normal and breath sounds normal. No respiratory distress. She has no wheezes. She has no rales.  Abdominal: Bowel sounds are normal. She exhibits no distension. There is no tenderness. There is no rebound and no guarding.          Assessment & Plan:

## 2016-09-08 NOTE — Progress Notes (Signed)
Pre visit review using our clinic review tool, if applicable. No additional management support is needed unless otherwise documented below in the visit note. 

## 2016-09-08 NOTE — Assessment & Plan Note (Signed)
Over a month No clear infection Now with weight loss and no appetite Will set up with GI

## 2016-09-08 NOTE — Assessment & Plan Note (Signed)
Right V1 Discussed emergent eye evaluation if gets near or involving eye Will give antivirals

## 2016-09-13 ENCOUNTER — Telehealth: Payer: Self-pay | Admitting: Physician Assistant

## 2016-09-13 ENCOUNTER — Encounter: Payer: Medicare Other | Admitting: Internal Medicine

## 2016-09-13 NOTE — Telephone Encounter (Signed)
Pt called to say that she really does not want to have a colon done, if the visit tomorrow is just to discuss having a colon she doesn't feel she needs to come. Discussed with her that she was referred for diarrhea and different labs including detailed stool tests may be ordered. Pt wanted to know about the cologuard. Discussed with her that is ordered to detect polyps or cancers but really could not answer why shes having diarrhea. Pt verbalized understanding and knows to keep her appt.

## 2016-09-14 ENCOUNTER — Other Ambulatory Visit: Payer: Medicare Other

## 2016-09-14 ENCOUNTER — Encounter: Payer: Self-pay | Admitting: Physician Assistant

## 2016-09-14 ENCOUNTER — Ambulatory Visit (INDEPENDENT_AMBULATORY_CARE_PROVIDER_SITE_OTHER): Payer: Medicare Other | Admitting: Physician Assistant

## 2016-09-14 VITALS — BP 114/60 | HR 78 | Ht 60.25 in | Wt 118.0 lb

## 2016-09-14 DIAGNOSIS — R197 Diarrhea, unspecified: Secondary | ICD-10-CM

## 2016-09-14 MED ORDER — DIPHENOXYLATE-ATROPINE 2.5-0.025 MG PO TABS: 1.0000 | ORAL_TABLET | Freq: Four times a day (QID) | ORAL | 1 refills | Status: DC

## 2016-09-14 NOTE — Progress Notes (Signed)
Chief Complaint: Diarrhea  HPI:  [name] is a 76 year old Caucasian female with a past medical history of diverticulosis, GERD, hypertension, osteoarthritis, osteoporosis and others listed below, who was referred to me by Angela Carbon, MD for a complaint of persistant diarrhea .     Patient has previously followed in our clinic with Dr. Deatra Bowen with her last colonoscopy performed 10/09/12. This was normal but due to the patient's family history of colon cancer it was recommended she repeat in 5 years.   Per chart review patient recently had labs completed 09/01/16 including a stool culture which was negative, CMP was normal and CBC was normal.   Today, the patient tells me that she has had watery diarrhea for 5 weeks. She tells me this started out of the blue and first was just like water " running out" of her. She tells me that this happened more than 10 times a day. At first she tried stopping what she was eating but this did not help her symptoms and so over the past 1-2 weeks she has started to eat more and has noticed that her stool has at least "a little form" to it. It has also changed to a more yellow color and is 7-9 times a day. Patient tells me she just feels "wore out". Patient has tried Imodium as well as Pepto and Lomotil once daily none of these have helped her symptoms. She is unsure id she has a history of being on antibiotics before symptoms started as it was "so long ago". She denies sick contacts or changes in medication.   Patient denies fever, chills, blood in her stool, melena, weight loss, anorexia, nausea, vomiting, heartburn, reflux or abdominal pain.  Past Medical History:  Diagnosis Date  . Cancer ([place])    stage II melanoma; right leg;face  . Diverticulosis of colon   . Gallstones   . GERD (gastroesophageal reflux disease)   . History of skin cancer   . Hypertension   . Normal stress echocardiogram 01/2006   echo 5/98, fairly normal  . Osteoarthritis   .  Osteoporosis, unspecified   . S/P sclerotherapy of varicose veins 07/30/06   left leg, Dr. Eilleen Bowen    Past Surgical History:  Procedure Laterality Date  . CHOLECYSTECTOMY  2001  . FOOT SURGERY     right  . MELANOMA EXCISION  7/10   leg  . [place]   bilateral  . VARICOSE VEIN SURGERY  2004   right foot    Current Outpatient Prescriptions  Medication Sig Dispense Refill  . acetaminophen (TYLENOL) 500 MG tablet Take 500 mg by mouth every 6 (six) hours as needed (pain).    . calcium carbonate (OS-CAL) 600 MG TABS Take 600 mg by mouth daily.    . cholecalciferol (VITAMIN D) 1000 UNITS tablet Take 1,000 Units by mouth daily.     . diphenoxylate-atropine (LOMOTIL) 2.5-0.025 MG tablet Take 1 tablet by mouth 4 (four) times daily. 56 tablet 1  . EPINEPHrine 0.3 mg/0.3 mL IJ SOAJ injection Inject 0.3 mLs (0.3 mg total) into the muscle once. 2 Device 0  . lisinopril-hydrochlorothiazide (PRINZIDE,ZESTORETIC) 20-12.5 MG tablet Take 1 tablet by mouth daily. 90 tablet 3  . Multiple Vitamin (MULTIVITAMIN) tablet Take 1 tablet by mouth daily.      . Omega-3 Fatty Acids (FISH OIL PO) Take by mouth.    Marland Kitchen omeprazole (PRILOSEC) 20 MG capsule TAKE 1 CAPSULE TWICE A DAY BEFORE MEALS 180 capsule  2  . valACYclovir (VALTREX) 1000 MG tablet Take 1 tablet (1,000 mg total) by mouth 3 (three) times daily. 21 tablet 1   No current facility-administered medications for this visit.     Allergies as of 09/14/2016 - Review Complete 09/14/2016  Allergen Reaction Noted  . Nsaids Anaphylaxis 11/17/2015  . Aspirin    . Codeine  01/19/2009    Family History  Problem Relation Age of Onset  . Alcohol abuse Mother   . Colon cancer Mother   . Osteoporosis Mother   . Alcohol abuse Father   . Diabetes Father   . Lung cancer Brother   . Ovarian cancer Maternal Grandmother     Social History   Social History  . Marital status: Married    Spouse name: N/A  . Number of children: 2  . Years of  education: N/A   Occupational History  . homemaker    Social History Main Topics  . Smoking status: Never Smoker  . Smokeless tobacco: Never Used  . Alcohol use No     Comment: Wine, rarely  . Drug use: No  . Sexual activity: No   Other Topics Concern  . Not on file   Social History Narrative   Has living will   No designated health care POA--requests husband   Would accept resuscitation attempts   No tube feeds if cognitively unaware    Review of Systems:    Constitutional: Positive for fatigue Skin: No rash Cardiovascular: No chest pain   Respiratory: No SOB Gastrointestinal: See HPI and otherwise negative Genitourinary: No dysuria Neurological: No headache Musculoskeletal: No new muscle or joint pain Hematologic: No bleeding  Psychiatric: No history of depression or anxiety   Physical Exam:  Vital signs: BP 114/60   Pulse 78   Ht 5' 0.25" (1.53 m)   Wt 118 lb (53.5 kg)   SpO2 98%   BMI 22.85 kg/m   Constitutional:   Pleasant Elderly Caucasian female appears to be in NAD, Well developed, Well nourished, alert and cooperative Head:  Normocephalic and atraumatic. Eyes:   PEERL, EOMI. No icterus. Conjunctiva pink. Ears:  Normal auditory acuity. Neck:  Supple Throat: Oral cavity and pharynx without inflammation, swelling or lesion.  Respiratory: Respirations even and unlabored. Lungs clear to auscultation bilaterally.   No wheezes, crackles, or rhonchi.  Cardiovascular: Normal S1, S2. No MRG. Regular rate and rhythm. No peripheral edema, cyanosis or pallor.  Gastrointestinal:  Soft, nondistended, nontender. No rebound or guarding. Normal bowel sounds. No appreciable masses or hepatomegaly. Rectal:  Not performed.  Msk:  Symmetrical without gross deformities. Without edema, no deformity or joint abnormality.  Neurologic:  Alert and  oriented x4;  grossly normal neurologically.  Skin:   Dry and intact without significant lesions or rashes. Psychiatric:   Demonstrates good judgement and reason without abnormal affect or behaviors.  RELEVANT LABS: CBC    Component Value Date/Time   WBC 7.0 08/31/2016 1016   RBC 4.46 08/31/2016 1016   HGB 13.7 08/31/2016 1016   HCT 39.9 08/31/2016 1016   PLT 263.0 08/31/2016 1016   MCV 89.4 08/31/2016 1016   MCH 30.9 04/25/2015 0940   MCHC 34.4 08/31/2016 1016   RDW 12.2 08/31/2016 1016   LYMPHSABS 2.5 08/31/2016 1016   MONOABS 0.6 08/31/2016 1016   EOSABS 0.2 08/31/2016 1016   BASOSABS 0.2 (H) 08/31/2016 1016    CMP     Component Value Date/Time   NA 140 08/31/2016 1016   K 4.1  08/31/2016 1016   CL 103 08/31/2016 1016   CO2 31 08/31/2016 1016   GLUCOSE 93 08/31/2016 1016   BUN 12 08/31/2016 1016   CREATININE 0.83 08/31/2016 1016   CALCIUM 9.5 08/31/2016 1016   PROT 6.3 08/31/2016 1016   ALBUMIN 4.0 08/31/2016 1016   AST 22 08/31/2016 1016   ALT 23 08/31/2016 1016   ALKPHOS 57 08/31/2016 1016   BILITOT 0.4 08/31/2016 1016   GFRNONAA >60 04/25/2015 0940   GFRAA >60 04/25/2015 0940    Assessment: 1. Diarrhea: For the past 5 weeks, 7-9 times daily, now with slightly more substance since patient started eating again, no help from Lomotil once daily, Imodium or Pepto-Bismol in the past, no abdominal pain, no hematochezia, no change in medications or diet; consider infectious cause versus microscopic colitis versus other  Plan: 1. Discussed a colonoscopy today for further evaluation but patient would like to delay this as much as possible. We did discuss that if further stool studies returned negative and patient continues with symptoms would recommend one. She acknowledges. 2. Ordered further stool studies to include a GI pathogen panel and O&P 3. Recommend the patient schedule her Lomotil 1 tab by mouth 4 times a day. 4. Patient to follow in clinic in 1-2 months with Dr. Havery Moros or myself  Ellouise Newer, PA-C [facility] 09/14/2016, 10:08 AM  Cc: Angela Carbon,  MD

## 2016-09-14 NOTE — Progress Notes (Signed)
Agree with assessment and plan. If stool studies negative for infection, agree that colonoscopy would be recommended to rule out microscopic colitis given this presentation.

## 2016-09-14 NOTE — Patient Instructions (Signed)
We have sent the following medications to your pharmacy for you to pick up at your convenience: Lomotil 1 tablet four times a day  Your physician has requested that you go to the basement for lab work before leaving today.

## 2016-09-15 LAB — GASTROINTESTINAL PATHOGEN PANEL PCR
C. difficile Tox A/B, PCR: NOT DETECTED
Campylobacter, PCR: NOT DETECTED
Cryptosporidium, PCR: NOT DETECTED
E coli (ETEC) LT/ST PCR: NOT DETECTED
E coli (STEC) stx1/stx2, PCR: NOT DETECTED
E coli 0157, PCR: NOT DETECTED
Giardia lamblia, PCR: NOT DETECTED
Norovirus, PCR: NOT DETECTED
Rotavirus A, PCR: NOT DETECTED
Salmonella, PCR: NOT DETECTED
Shigella, PCR: NOT DETECTED

## 2016-09-15 LAB — OVA AND PARASITE EXAMINATION: OP: NONE SEEN

## 2016-09-21 DIAGNOSIS — R197 Diarrhea, unspecified: Secondary | ICD-10-CM | POA: Diagnosis not present

## 2016-09-21 DIAGNOSIS — R1013 Epigastric pain: Secondary | ICD-10-CM | POA: Diagnosis not present

## 2016-09-21 DIAGNOSIS — R634 Abnormal weight loss: Secondary | ICD-10-CM | POA: Diagnosis not present

## 2016-09-28 ENCOUNTER — Ambulatory Visit: Payer: Medicare Other | Admitting: Physician Assistant

## 2016-10-14 ENCOUNTER — Other Ambulatory Visit: Payer: Self-pay | Admitting: Internal Medicine

## 2016-10-20 DIAGNOSIS — K219 Gastro-esophageal reflux disease without esophagitis: Secondary | ICD-10-CM | POA: Diagnosis not present

## 2016-10-20 DIAGNOSIS — R1013 Epigastric pain: Secondary | ICD-10-CM | POA: Diagnosis not present

## 2016-10-20 DIAGNOSIS — K5710 Diverticulosis of small intestine without perforation or abscess without bleeding: Secondary | ICD-10-CM | POA: Diagnosis not present

## 2016-10-20 DIAGNOSIS — R634 Abnormal weight loss: Secondary | ICD-10-CM | POA: Diagnosis not present

## 2016-10-20 DIAGNOSIS — K5730 Diverticulosis of large intestine without perforation or abscess without bleeding: Secondary | ICD-10-CM | POA: Diagnosis not present

## 2016-10-20 DIAGNOSIS — Z85828 Personal history of other malignant neoplasm of skin: Secondary | ICD-10-CM | POA: Diagnosis not present

## 2016-10-20 DIAGNOSIS — K2950 Unspecified chronic gastritis without bleeding: Secondary | ICD-10-CM | POA: Diagnosis not present

## 2016-10-20 DIAGNOSIS — I10 Essential (primary) hypertension: Secondary | ICD-10-CM | POA: Diagnosis not present

## 2016-10-20 DIAGNOSIS — K30 Functional dyspepsia: Secondary | ICD-10-CM | POA: Diagnosis not present

## 2016-10-20 DIAGNOSIS — R197 Diarrhea, unspecified: Secondary | ICD-10-CM | POA: Diagnosis not present

## 2016-10-20 DIAGNOSIS — Z79899 Other long term (current) drug therapy: Secondary | ICD-10-CM | POA: Diagnosis not present

## 2016-10-24 ENCOUNTER — Other Ambulatory Visit: Payer: Self-pay | Admitting: Internal Medicine

## 2016-11-07 ENCOUNTER — Ambulatory Visit: Payer: Medicare Other | Admitting: Gastroenterology

## 2016-11-29 ENCOUNTER — Encounter: Payer: Medicare Other | Admitting: Internal Medicine

## 2016-12-07 DIAGNOSIS — L578 Other skin changes due to chronic exposure to nonionizing radiation: Secondary | ICD-10-CM | POA: Diagnosis not present

## 2016-12-07 DIAGNOSIS — Z85820 Personal history of malignant melanoma of skin: Secondary | ICD-10-CM | POA: Diagnosis not present

## 2016-12-07 DIAGNOSIS — L820 Inflamed seborrheic keratosis: Secondary | ICD-10-CM | POA: Diagnosis not present

## 2016-12-07 DIAGNOSIS — Z85828 Personal history of other malignant neoplasm of skin: Secondary | ICD-10-CM | POA: Diagnosis not present

## 2016-12-07 DIAGNOSIS — L570 Actinic keratosis: Secondary | ICD-10-CM | POA: Diagnosis not present

## 2016-12-09 ENCOUNTER — Ambulatory Visit (INDEPENDENT_AMBULATORY_CARE_PROVIDER_SITE_OTHER): Payer: Medicare Other | Admitting: Internal Medicine

## 2016-12-09 ENCOUNTER — Encounter: Payer: Self-pay | Admitting: Internal Medicine

## 2016-12-09 VITALS — BP 120/82 | HR 75 | Ht 60.0 in | Wt 114.5 lb

## 2016-12-09 DIAGNOSIS — R197 Diarrhea, unspecified: Secondary | ICD-10-CM

## 2016-12-09 MED ORDER — LISINOPRIL-HYDROCHLOROTHIAZIDE 20-12.5 MG PO TABS: 1.0000 | ORAL_TABLET | Freq: Every day | ORAL | 1 refills | Status: DC

## 2016-12-09 NOTE — Progress Notes (Signed)
Pre visit review using our clinic review tool, if applicable. No additional management support is needed unless otherwise documented below in the visit note. 

## 2016-12-09 NOTE — Progress Notes (Signed)
Subjective:    Patient ID: Angela Bowen, female    DOB: March 14, 1941, 76 y.o.   MRN: [id number]  HPI Here for follow up of diarrhea Stressed--- had "mini-tornado" come down her yard yesterday Damage in yard  Still having some stomach trouble--doesn't feel right Had EGD and colon--- no sprue or other diagnosis (like IBD) Better --no diarrhea No blood Eating okay--but weight down slightly  No N/V Tried a probiotic---thinks it made her worse  Current Outpatient Prescriptions on File Prior to Visit  Medication Sig Dispense Refill  . acetaminophen (TYLENOL) 500 MG tablet Take 500 mg by mouth every 6 (six) hours as needed (pain).    . calcium carbonate (OS-CAL) 600 MG TABS Take 600 mg by mouth daily.    . cholecalciferol (VITAMIN D) 1000 UNITS tablet Take 1,000 Units by mouth daily.     Marland Kitchen EPINEPHrine 0.3 mg/0.3 mL IJ SOAJ injection Inject 0.3 mLs (0.3 mg total) into the muscle once. 2 Device 0  . Multiple Vitamin (MULTIVITAMIN) tablet Take 1 tablet by mouth daily.      . Omega-3 Fatty Acids (FISH OIL PO) Take by mouth.    Marland Kitchen omeprazole (PRILOSEC) 20 MG capsule TAKE 1 CAPSULE TWICE A DAY BEFORE MEALS 180 capsule 3  . [DISCONTINUED] Calcium-Magnesium (CALCIUM MAGNESIUM 750) 300-300 MG TABS Take by mouth daily.      No current facility-administered medications on file prior to visit.     Allergies  Allergen Reactions  . Nsaids Anaphylaxis  . Aspirin     High doses - intolerant  . Codeine     REACTION: rash    Past Medical History:  Diagnosis Date  . Cancer ([place])    stage II melanoma; right leg;face  . Diverticulosis of colon   . Gallstones   . GERD (gastroesophageal reflux disease)   . History of skin cancer   . Hypertension   . Normal stress echocardiogram 01/2006   echo 5/98, fairly normal  . Osteoarthritis   . Osteoporosis, unspecified   . S/P sclerotherapy of varicose veins 07/30/06   left leg, Dr. Eilleen Kempf    Past Surgical History:  Procedure Laterality Date  .  CHOLECYSTECTOMY  2001  . FOOT SURGERY     right  . MELANOMA EXCISION  7/10   leg  . VARICOSE VEIN SURGERY  1984   bilateral  . VARICOSE VEIN SURGERY  2004   right foot    Family History  Problem Relation Age of Onset  . Alcohol abuse Mother   . Colon cancer Mother   . Osteoporosis Mother   . Alcohol abuse Father   . Diabetes Father   . Lung cancer Brother   . Ovarian cancer Maternal Grandmother     Social History   Social History  . Marital status: Married    Spouse name: N/A  . Number of children: 2  . Years of education: N/A   Occupational History  . homemaker    Social History Main Topics  . Smoking status: Never Smoker  . Smokeless tobacco: Never Used  . Alcohol use No     Comment: Wine, rarely  . Drug use: No  . Sexual activity: No   Other Topics Concern  . Not on file   Social History Narrative   Has living will   No designated health care POA--requests husband   Would accept resuscitation attempts   No tube feeds if cognitively unaware   Review of Systems Shingles resolved Had some lesions  sprayed at dermatologist yesterday Sleeps okay in general    Objective:   Physical Exam  Constitutional: She appears well-nourished. No distress.  Neck: No thyromegaly present.  Cardiovascular: Normal rate and regular rhythm.  Exam reveals no gallop.   No murmur heard. Pulmonary/Chest: Effort normal and breath sounds normal. No respiratory distress. She has no wheezes. She has no rales.  Abdominal: Soft. She exhibits no distension. There is no tenderness. There is no rebound and no guarding.  Lymphadenopathy:    She has no cervical adenopathy.          Assessment & Plan:

## 2016-12-09 NOTE — Assessment & Plan Note (Signed)
Resolved Had full workup---no IBD or celiac disease Slight weight loss Discussed monitoring weight Continue activia Discussed warning symptoms

## 2017-01-16 ENCOUNTER — Other Ambulatory Visit: Payer: Self-pay

## 2017-02-20 ENCOUNTER — Other Ambulatory Visit: Payer: Self-pay | Admitting: Internal Medicine

## 2017-02-20 DIAGNOSIS — Z1231 Encounter for screening mammogram for malignant neoplasm of breast: Secondary | ICD-10-CM

## 2017-03-20 ENCOUNTER — Ambulatory Visit
Admission: RE | Admit: 2017-03-20 | Discharge: 2017-03-20 | Disposition: A | Discharge status: Home / Self Care | Payer: Medicare Other | Source: Ambulatory Visit | Attending: Internal Medicine | Admitting: Internal Medicine

## 2017-03-20 DIAGNOSIS — Z1231 Encounter for screening mammogram for malignant neoplasm of breast: Secondary | ICD-10-CM | POA: Diagnosis not present

## 2017-04-17 DIAGNOSIS — D1801 Hemangioma of skin and subcutaneous tissue: Secondary | ICD-10-CM | POA: Diagnosis not present

## 2017-04-17 DIAGNOSIS — I8393 Asymptomatic varicose veins of bilateral lower extremities: Secondary | ICD-10-CM | POA: Diagnosis not present

## 2017-04-17 DIAGNOSIS — L578 Other skin changes due to chronic exposure to nonionizing radiation: Secondary | ICD-10-CM | POA: Diagnosis not present

## 2017-04-17 DIAGNOSIS — L820 Inflamed seborrheic keratosis: Secondary | ICD-10-CM | POA: Diagnosis not present

## 2017-04-17 DIAGNOSIS — Z85828 Personal history of other malignant neoplasm of skin: Secondary | ICD-10-CM | POA: Diagnosis not present

## 2017-04-17 DIAGNOSIS — L821 Other seborrheic keratosis: Secondary | ICD-10-CM | POA: Diagnosis not present

## 2017-04-17 DIAGNOSIS — Z1283 Encounter for screening for malignant neoplasm of skin: Secondary | ICD-10-CM | POA: Diagnosis not present

## 2017-04-17 DIAGNOSIS — D229 Melanocytic nevi, unspecified: Secondary | ICD-10-CM | POA: Diagnosis not present

## 2017-04-17 DIAGNOSIS — L812 Freckles: Secondary | ICD-10-CM | POA: Diagnosis not present

## 2017-04-17 DIAGNOSIS — Z85820 Personal history of malignant melanoma of skin: Secondary | ICD-10-CM | POA: Diagnosis not present

## 2017-04-17 DIAGNOSIS — L570 Actinic keratosis: Secondary | ICD-10-CM | POA: Diagnosis not present

## 2017-04-17 DIAGNOSIS — D485 Neoplasm of uncertain behavior of skin: Secondary | ICD-10-CM | POA: Diagnosis not present

## 2017-05-01 DIAGNOSIS — Z961 Presence of intraocular lens: Secondary | ICD-10-CM | POA: Diagnosis not present

## 2017-06-28 ENCOUNTER — Other Ambulatory Visit: Payer: Self-pay | Admitting: Internal Medicine

## 2017-06-28 DIAGNOSIS — I10 Essential (primary) hypertension: Secondary | ICD-10-CM

## 2017-06-28 NOTE — Telephone Encounter (Signed)
Looks like she's been seen for several acute visits for diarrhea. CMP from March 2018 reviewed and stable. Will refill for one month, recommend she set up CPE with PCP for further refills.

## 2017-06-28 NOTE — Telephone Encounter (Signed)
Last CPE 10/2015. pls advise

## 2017-07-06 ENCOUNTER — Telehealth: Payer: Self-pay | Admitting: Internal Medicine

## 2017-07-06 DIAGNOSIS — I10 Essential (primary) hypertension: Secondary | ICD-10-CM

## 2017-07-06 MED ORDER — LISINOPRIL-HYDROCHLOROTHIAZIDE 20-12.5 MG PO TABS
1.0000 | ORAL_TABLET | Freq: Every day | ORAL | 1 refills | Status: DC
Start: 2017-07-06 — End: 2017-12-15

## 2017-07-06 MED ORDER — OMEPRAZOLE 20 MG PO CPDR: DELAYED_RELEASE_CAPSULE | ORAL | 3 refills | Status: DC

## 2017-07-06 NOTE — Telephone Encounter (Signed)
Patient called to notify she will need an CPE appointment before more refills on lisinopril and omeprazole, she said she already made the appointments for May and June, appointment was confirmed for 12/11/17 with Dr. Silvio Pate for CPE, she is requesting enough refills on her meds to carry her through to June.

## 2017-07-06 NOTE — Telephone Encounter (Signed)
Rxs sent electronically.  

## 2017-07-06 NOTE — Telephone Encounter (Signed)
Copied from [place] [id number]. Topic: Quick Communication - Rx Refill/Question >> Jul 06, 2017 10:59 AM Arletha Grippe wrote: Medication: lisinopril-hydrochlorothiazide (PRINZIDE,ZESTORETIC) 20-12.5 MG tablet  and omeprazole (PRILOSEC) 20 MG capsule request 90 day supply      Has the patient contacted their pharmacy? Yes.  Told to call us    (Agent: If no, request that the patient contact the pharmacy for the refill.)   Preferred Pharmacy (with phone number or street name): [facility] - [address], [place] [phone] (Phone    Agent: Please be advised that RX refills may take up to 3 business days. We ask that you follow-up with your pharmacy.

## 2017-07-28 ENCOUNTER — Encounter: Payer: Self-pay | Admitting: Emergency Medicine

## 2017-07-28 ENCOUNTER — Inpatient Hospital Stay
Admission: EM | Admit: 2017-07-28 | Discharge: 2017-07-30 | DRG: 378 | Disposition: A | Discharge status: Home / Self Care | Payer: Medicare Other | Attending: Family Medicine | Admitting: Family Medicine

## 2017-07-28 ENCOUNTER — Emergency Department: Payer: Medicare Other

## 2017-07-28 ENCOUNTER — Other Ambulatory Visit: Payer: Self-pay

## 2017-07-28 DIAGNOSIS — Z79899 Other long term (current) drug therapy: Secondary | ICD-10-CM | POA: Diagnosis not present

## 2017-07-28 DIAGNOSIS — I10 Essential (primary) hypertension: Secondary | ICD-10-CM | POA: Diagnosis present

## 2017-07-28 DIAGNOSIS — A0811 Acute gastroenteropathy due to Norwalk agent: Secondary | ICD-10-CM | POA: Diagnosis present

## 2017-07-28 DIAGNOSIS — R197 Diarrhea, unspecified: Secondary | ICD-10-CM | POA: Diagnosis not present

## 2017-07-28 DIAGNOSIS — R1110 Vomiting, unspecified: Secondary | ICD-10-CM | POA: Diagnosis not present

## 2017-07-28 DIAGNOSIS — Z886 Allergy status to analgesic agent status: Secondary | ICD-10-CM | POA: Diagnosis not present

## 2017-07-28 DIAGNOSIS — K219 Gastro-esophageal reflux disease without esophagitis: Secondary | ICD-10-CM | POA: Diagnosis present

## 2017-07-28 DIAGNOSIS — K5730 Diverticulosis of large intestine without perforation or abscess without bleeding: Secondary | ICD-10-CM | POA: Diagnosis present

## 2017-07-28 DIAGNOSIS — Z885 Allergy status to narcotic agent status: Secondary | ICD-10-CM

## 2017-07-28 DIAGNOSIS — E860 Dehydration: Secondary | ICD-10-CM

## 2017-07-28 DIAGNOSIS — E878 Other disorders of electrolyte and fluid balance, not elsewhere classified: Secondary | ICD-10-CM | POA: Diagnosis present

## 2017-07-28 DIAGNOSIS — K922 Gastrointestinal hemorrhage, unspecified: Secondary | ICD-10-CM | POA: Diagnosis present

## 2017-07-28 DIAGNOSIS — M810 Age-related osteoporosis without current pathological fracture: Secondary | ICD-10-CM | POA: Diagnosis present

## 2017-07-28 DIAGNOSIS — R112 Nausea with vomiting, unspecified: Secondary | ICD-10-CM | POA: Diagnosis not present

## 2017-07-28 DIAGNOSIS — Z9049 Acquired absence of other specified parts of digestive tract: Secondary | ICD-10-CM | POA: Diagnosis not present

## 2017-07-28 DIAGNOSIS — K580 Irritable bowel syndrome with diarrhea: Secondary | ICD-10-CM | POA: Diagnosis present

## 2017-07-28 DIAGNOSIS — K921 Melena: Secondary | ICD-10-CM | POA: Diagnosis present

## 2017-07-28 DIAGNOSIS — E876 Hypokalemia: Secondary | ICD-10-CM | POA: Diagnosis present

## 2017-07-28 DIAGNOSIS — Z85820 Personal history of malignant melanoma of skin: Secondary | ICD-10-CM | POA: Diagnosis not present

## 2017-07-28 DIAGNOSIS — Z8262 Family history of osteoporosis: Secondary | ICD-10-CM | POA: Diagnosis not present

## 2017-07-28 LAB — TYPE AND SCREEN
ABO/RH(D): O POS
Antibody Screen: NEGATIVE

## 2017-07-28 LAB — GASTROINTESTINAL PANEL BY PCR, STOOL (REPLACES STOOL CULTURE)

## 2017-07-28 LAB — C DIFFICILE QUICK SCREEN W PCR REFLEX
C Diff antigen: NEGATIVE
C Diff interpretation: NOT DETECTED
C Diff toxin: NEGATIVE

## 2017-07-28 LAB — CBC
HCT: 47.4 % — ABNORMAL HIGH (ref 35.0–47.0)
Hemoglobin: 15.8 g/dL (ref 12.0–16.0)
MCH: 29.8 pg (ref 26.0–34.0)
MCHC: 33.2 g/dL (ref 32.0–36.0)
MCV: 89.6 fL (ref 80.0–100.0)
Platelets: 258 10*3/uL (ref 150–440)
RBC: 5.29 MIL/uL — ABNORMAL HIGH (ref 3.80–5.20)
RDW: 12.2 % (ref 11.5–14.5)
WBC: 15.1 10*3/uL — ABNORMAL HIGH (ref 3.6–11.0)

## 2017-07-28 LAB — COMPREHENSIVE METABOLIC PANEL
ALT: 28 U/L (ref 14–54)
AST: 35 U/L (ref 15–41)
Albumin: 4.5 g/dL (ref 3.5–5.0)
Alkaline Phosphatase: 63 U/L (ref 38–126)
Anion gap: 15 (ref 5–15)
BUN: 28 mg/dL — ABNORMAL HIGH (ref 6–20)
CO2: 24 mmol/L (ref 22–32)
Calcium: 9.7 mg/dL (ref 8.9–10.3)
Chloride: 99 mmol/L — ABNORMAL LOW (ref 101–111)
Creatinine, Ser: 0.98 mg/dL (ref 0.44–1.00)
GFR calc Af Amer: >60 mL/min (ref >60)
GFR calc non Af Amer: 55 mL/min — ABNORMAL LOW (ref >60)
Glucose, Bld: 152 mg/dL — ABNORMAL HIGH (ref 65–99)
Potassium: 2.9 mmol/L — ABNORMAL LOW (ref 3.5–5.1)
Sodium: 138 mmol/L (ref 135–145)
Total Bilirubin: 0.8 mg/dL (ref 0.3–1.2)
Total Protein: 7.9 g/dL (ref 6.5–8.1)

## 2017-07-28 LAB — TROPONIN I: Troponin I: <0.03 ng/mL (ref <0.03)

## 2017-07-28 LAB — LIPASE, BLOOD: Lipase: 23 U/L (ref 11–51)

## 2017-07-28 MED ORDER — LISINOPRIL 20 MG PO TABS
20.0000 mg | ORAL_TABLET | Freq: Every day | ORAL | Status: DC
  Administered 2017-07-29: 20 mg via ORAL
  Filled 2017-07-28 (×2): qty 1

## 2017-07-28 MED ORDER — POTASSIUM CHLORIDE 20 MEQ PO PACK: 40.0000 meq | PACK | Freq: Once | ORAL | Status: DC

## 2017-07-28 MED ORDER — ONDANSETRON HCL 4 MG/2ML IJ SOLN
4.0000 mg | Freq: Once | INTRAMUSCULAR | Status: AC
  Administered 2017-07-28: 4 mg via INTRAVENOUS
  Filled 2017-07-28: qty 2

## 2017-07-28 MED ORDER — CALCIUM CARBONATE ANTACID 500 MG PO CHEW
500.0000 mg | CHEWABLE_TABLET | Freq: Every day | ORAL | Status: DC
  Administered 2017-07-29: 500 mg via ORAL
  Filled 2017-07-28 (×2): qty 3

## 2017-07-28 MED ORDER — PANTOPRAZOLE SODIUM 40 MG IV SOLR
8.0000 mg/h | INTRAVENOUS | Status: DC
  Filled 2017-07-28: qty 80

## 2017-07-28 MED ORDER — PANTOPRAZOLE SODIUM 40 MG IV SOLR
40.0000 mg | Freq: Two times a day (BID) | INTRAVENOUS | Status: DC
Start: 2017-08-01 — End: 2017-07-29

## 2017-07-28 MED ORDER — ONDANSETRON HCL 4 MG/2ML IJ SOLN: 4.0000 mg | Freq: Four times a day (QID) | INTRAMUSCULAR | Status: DC | PRN

## 2017-07-28 MED ORDER — HYDROCHLOROTHIAZIDE 12.5 MG PO CAPS
12.5000 mg | ORAL_CAPSULE | Freq: Every day | ORAL | Status: DC
  Administered 2017-07-29: 12.5 mg via ORAL
  Filled 2017-07-28 (×2): qty 1

## 2017-07-28 MED ORDER — FENTANYL CITRATE (PF) 100 MCG/2ML IJ SOLN
25.0000 ug | INTRAMUSCULAR | Status: DC | PRN
Start: 2017-07-28 — End: 2017-07-30

## 2017-07-28 MED ORDER — SODIUM CHLORIDE 0.9 % IV BOLUS (SEPSIS)
1000.0000 mL | Freq: Once | INTRAVENOUS | Status: AC
  Administered 2017-07-28: 1000 mL via INTRAVENOUS

## 2017-07-28 MED ORDER — KCL IN DEXTROSE-NACL 20-5-0.45 MEQ/L-%-% IV SOLN
INTRAVENOUS | Status: DC
  Administered 2017-07-29: 09:00:00 via INTRAVENOUS
  Filled 2017-07-28 (×3): qty 1000

## 2017-07-28 MED ORDER — ADULT MULTIVITAMIN W/MINERALS CH
1.0000 | ORAL_TABLET | Freq: Every day | ORAL | Status: DC
  Administered 2017-07-29 – 2017-07-30 (×2): 1 via ORAL
  Filled 2017-07-28 (×2): qty 1

## 2017-07-28 MED ORDER — LISINOPRIL-HYDROCHLOROTHIAZIDE 20-12.5 MG PO TABS: 1.0000 | ORAL_TABLET | Freq: Every day | ORAL | Status: DC

## 2017-07-28 MED ORDER — IOPAMIDOL (ISOVUE-300) INJECTION 61%
75.0000 mL | Freq: Once | INTRAVENOUS | Status: AC | PRN
  Administered 2017-07-28: 75 mL via INTRAVENOUS

## 2017-07-28 MED ORDER — VITAMIN D 1000 UNITS PO TABS
1000.0000 [IU] | ORAL_TABLET | Freq: Every day | ORAL | Status: DC
  Administered 2017-07-29: 1000 [IU] via ORAL
  Filled 2017-07-28 (×2): qty 1

## 2017-07-28 MED ORDER — ONDANSETRON HCL 4 MG PO TABS: 4.0000 mg | ORAL_TABLET | Freq: Four times a day (QID) | ORAL | Status: DC | PRN

## 2017-07-28 MED ORDER — ACETAMINOPHEN 500 MG PO TABS: 500.0000 mg | ORAL_TABLET | Freq: Four times a day (QID) | ORAL | Status: DC | PRN

## 2017-07-28 MED ORDER — PANTOPRAZOLE SODIUM 40 MG IV SOLR
40.0000 mg | Freq: Once | INTRAVENOUS | Status: AC
  Administered 2017-07-28: 40 mg via INTRAVENOUS
  Filled 2017-07-28: qty 40

## 2017-07-28 MED ORDER — ONDANSETRON 4 MG PO TBDP
4.0000 mg | ORAL_TABLET | Freq: Once | ORAL | Status: DC
  Filled 2017-07-28: qty 1

## 2017-07-28 MED ORDER — OMEGA-3-ACID ETHYL ESTERS 1 G PO CAPS
1.0000 g | ORAL_CAPSULE | Freq: Every day | ORAL | Status: DC
  Administered 2017-07-29 – 2017-07-30 (×2): 1 g via ORAL
  Filled 2017-07-28 (×2): qty 1

## 2017-07-28 MED ORDER — HYDROCODONE-ACETAMINOPHEN 5-325 MG PO TABS: 1.0000 | ORAL_TABLET | ORAL | Status: DC | PRN

## 2017-07-28 MED ORDER — PANTOPRAZOLE SODIUM 40 MG IV SOLR
80.0000 mg | Freq: Once | INTRAVENOUS | Status: DC
  Filled 2017-07-28: qty 80

## 2017-07-28 NOTE — ED Notes (Signed)
ED Provider at bedside. 

## 2017-07-28 NOTE — ED Notes (Signed)
This RN assisted pt to restroom. Unable to separate urine from some stool in hat; unable to use for specimen.

## 2017-07-28 NOTE — ED Notes (Signed)
Per lab, they will add-on troponin to original blood work.

## 2017-07-28 NOTE — H&P (Signed)
Angela Bow[facility] at [facility] NAME: Angela Bowen    MR#:  [id number]  DATE OF BIRTH:  1941-05-14  DATE OF ADMISSION:  07/28/2017  PRIMARY CARE PHYSICIAN: Venia Carbon, MD   REQUESTING/REFERRING PHYSICIAN:   CHIEF COMPLAINT:   Chief Complaint  Patient presents with  . Emesis    HISTORY OF PRESENT ILLNESS: Angela Bowen  is a 77 y.o. female with a known history of chronic diarrhea, history of diverticulosis, most recent upper and lower endoscopy with biopsy done at [facility] in April 2018 which was a normal study per patient/husband, followed by Dr. [place]/gastroenterology locally, presents today with acute nausea/vomiting and diarrhea since 2100 hrs. yesterday, initially started with black/dark stools-changing to yellow, in the emergency room hemoglobin 5.8, white count 15, potassium 2.9, chloride 99, CT abdomen negative, patient evaluated emergency room, husband at the bedside, patient is now been admitted for acute melena with associated nausea/vomiting, hypokalemia, and history of chronic diarrhea.  PAST MEDICAL HISTORY:   Past Medical History:  Diagnosis Date  . Cancer ([place])    stage II melanoma; right leg;face  . Diverticulosis of colon   . Gallstones   . GERD (gastroesophageal reflux disease)   . History of skin cancer   . Hypertension   . Normal stress echocardiogram 01/2006   echo 5/98, fairly normal  . Osteoarthritis   . Osteoporosis, unspecified   . S/P sclerotherapy of varicose veins 07/30/06   left leg, Dr. Eilleen Kempf    PAST SURGICAL HISTORY:  Past Surgical History:  Procedure Laterality Date  . CHOLECYSTECTOMY  2001  . FOOT SURGERY     right  . MELANOMA EXCISION  7/10   leg  . VARICOSE VEIN SURGERY  1984   bilateral  . VARICOSE VEIN SURGERY  2004   right foot    SOCIAL HISTORY:  Social History   Tobacco Use  . Smoking status: Never Smoker  . Smokeless tobacco: Never Used  Substance Use Topics  . Alcohol use: No     Alcohol/week: 0.0 oz    Comment: Wine, rarely    FAMILY HISTORY:  Family History  Problem Relation Age of Onset  . Alcohol abuse Mother   . Colon cancer Mother   . Osteoporosis Mother   . Alcohol abuse Father   . Diabetes Father   . Lung cancer Brother   . Ovarian cancer Maternal Grandmother   . Breast cancer Neg Hx     DRUG ALLERGIES:  Allergies  Allergen Reactions  . Nsaids Anaphylaxis  . Aspirin     High doses - intolerant  . Codeine     REACTION: rash    REVIEW OF SYSTEMS:   CONSTITUTIONAL: No fever, fatigue or weakness.  EYES: No blurred or double vision.  EARS, NOSE, AND THROAT: No tinnitus or ear pain.  RESPIRATORY: No cough, shortness of breath, wheezing or hemoptysis.  CARDIOVASCULAR: No chest pain, orthopnea, edema.  GASTROINTESTINAL: + nausea, vomiting, black diarrhea, no abdominal pain.  GENITOURINARY: No dysuria, hematuria.  ENDOCRINE: No polyuria, nocturia,  HEMATOLOGY: No anemia, easy bruising or bleeding SKIN: No rash or lesion. MUSCULOSKELETAL: No joint pain or arthritis.   NEUROLOGIC: No tingling, numbness, weakness.  PSYCHIATRY: No anxiety or depression.   MEDICATIONS AT HOME:  Prior to Admission medications   Medication Sig Start Date End Date Taking? Authorizing Provider  acetaminophen (TYLENOL) 500 MG tablet Take 500 mg by mouth every 6 (six) hours as needed (pain).   Yes [provider]  calcium carbonate (OS-CAL) 600 MG TABS Take 600 mg by mouth daily.   Yes [provider]  cholecalciferol (VITAMIN D) 1000 UNITS tablet Take 1,000 Units by mouth daily.    Yes [provider]  EPINEPHrine 0.3 mg/0.3 mL IJ SOAJ injection Inject 0.3 mLs (0.3 mg total) into the muscle once. 04/25/15  Yes Gareth Morgan, MD  lisinopril-hydrochlorothiazide (PRINZIDE,ZESTORETIC) 20-12.5 MG tablet Take 1 tablet by mouth daily. 07/06/17  Yes Venia Carbon, MD  Multiple Vitamin (MULTIVITAMIN) tablet Take 1 tablet by mouth daily.     Yes  [provider]  Omega-3 Fatty Acids (FISH OIL PO) Take by mouth.   Yes [provider]  omeprazole (PRILOSEC) 20 MG capsule TAKE 1 CAPSULE TWICE A DAY BEFORE MEALS 07/06/17  Yes Venia Carbon, MD  Calcium-Magnesium (CALCIUM MAGNESIUM 750) 300-300 MG TABS Take by mouth daily.   09/16/11  [provider]      PHYSICAL EXAMINATION:   VITAL SIGNS: Blood pressure (!) 145/71, pulse 87, temperature 98.4 F (36.9 C), temperature source Oral, resp. rate 16, height 5\' 6"  (1.676 m), SpO2 97 %.  GENERAL:  77 y.o.-year-old patient lying in the bed with no acute distress.  EYES: Pupils equal, round, reactive to light and accommodation. No scleral icterus. Extraocular muscles intact.  HEENT: Head atraumatic, normocephalic. Oropharynx and nasopharynx clear.  NECK:  Supple, no jugular venous distention. No thyroid enlargement, no tenderness.  LUNGS: Normal breath sounds bilaterally, no wheezing, rales,rhonchi or crepitation. No use of accessory muscles of respiration.  CARDIOVASCULAR: S1, S2 normal. No murmurs, rubs, or gallops.  ABDOMEN: Soft, nontender, nondistended. Bowel sounds present. No organomegaly or mass.  EXTREMITIES: No pedal edema, cyanosis, or clubbing.  NEUROLOGIC: Cranial nerves II through XII are intact. Muscle strength 5/5 in all extremities. Sensation intact. Gait not checked.  PSYCHIATRIC: The patient is alert and oriented x 3.  SKIN: No obvious rash, lesion, or ulcer.   LABORATORY PANEL:   CBC Recent Labs  Lab 07/28/17 1458  WBC 15.1*  HGB 15.8  HCT 47.4*  PLT 258  MCV 89.6  MCH 29.8  MCHC 33.2  RDW 12.2   ------------------------------------------------------------------------------------------------------------------  Chemistries  Recent Labs  Lab 07/28/17 1458  NA 138  K 2.9*  CL 99*  CO2 24  GLUCOSE 152*  BUN 28*  CREATININE 0.98  CALCIUM 9.7  AST 35  ALT 28  ALKPHOS 63  BILITOT 0.8    ------------------------------------------------------------------------------------------------------------------ CrCl cannot be calculated (Unknown ideal weight.). ------------------------------------------------------------------------------------------------------------------ No results for input(s): TSH, T4TOTAL, T3FREE, THYROIDAB in the last 72 hours.  Invalid input(s): FREET3   Coagulation profile No results for input(s): INR, PROTIME in the last 168 hours. ------------------------------------------------------------------------------------------------------------------- No results for input(s): DDIMER in the last 72 hours. -------------------------------------------------------------------------------------------------------------------  Cardiac Enzymes Recent Labs  Lab 07/28/17 1508  TROPONINI <0.03   ------------------------------------------------------------------------------------------------------------------ Invalid input(s): POCBNP  ---------------------------------------------------------------------------------------------------------------  Urinalysis No results found for: COLORURINE, APPEARANCEUR, LABSPEC, PHURINE, GLUCOSEU, HGBUR, BILIRUBINUR, KETONESUR, PROTEINUR, UROBILINOGEN, NITRITE, LEUKOCYTESUR   RADIOLOGY: Ct Abdomen Pelvis W Contrast  Result Date: 07/28/2017 CLINICAL DATA:  Nausea and vomiting for 2 days EXAM: CT ABDOMEN AND PELVIS WITH CONTRAST TECHNIQUE: Multidetector CT imaging of the abdomen and pelvis was performed using the standard protocol following bolus administration of intravenous contrast. CONTRAST:  55mL ISOVUE-300 IOPAMIDOL (ISOVUE-300) INJECTION 61% COMPARISON:  None. FINDINGS: Lower chest: No acute abnormality. Hepatobiliary: No focal liver abnormality is seen. Status post cholecystectomy. No biliary dilatation. Pancreas: Unremarkable. No pancreatic ductal dilatation or surrounding inflammatory  changes. Spleen: Normal in size without  focal abnormality. Adrenals/Urinary Tract: Adrenal glands are unremarkable. Kidneys are normal, without renal calculi, focal lesion, or hydronephrosis. Bladder is unremarkable. Stomach/Bowel: Diverticular change of the colon is noted without evidence of diverticulitis. No obstructive or inflammatory changes of the bowel are noted. The appendix is within normal limits. Vascular/Lymphatic: Aortic atherosclerosis. No enlarged abdominal or pelvic lymph nodes. Reproductive: Uterus and bilateral adnexa are unremarkable. Other: No abdominal wall hernia or abnormality. No abdominopelvic ascites. Musculoskeletal: Mild degenerative changes of lumbar spine are noted. IMPRESSION: Chronic changes without acute abnormality. Electronically Signed   By: Inez Catalina M.D.   On: 07/28/2017 16:29    EKG: Orders placed or performed during the hospital encounter of 07/28/17  . EKG 12-Lead  . EKG 12-Lead    IMPRESSION AND PLAN: 1 acute melena With associated nausea/vomiting Noted history of chronic diarrhea, upper and lower endoscopy done at [facility] with biopsies on April 2018 were normal studies per patient/husband, stool studies at that time were unremarkable, noted history of diverticulosis, CT abdomen was a negative study Admit to regular nursing floor bed, Protonix drip, H&H every 6 hours, CBC daily, transfuse as needed, consult gastroenterology for expert opinion, avoid NSAIDs/antiplatelet agents/anticoagulants, n.p.o. after midnight, clear liquid diet for now  2 acute nausea/vomiting/diarrhea Cannot rule out possible acute gastroenteritis-patient denies any sick contacts Antiemetics PRN, check GI panel  3 acute hypokalemia/hypochloremia Replete with p.o. potassium/IV fluids, check BMP/magnesium in the morning  4 chronic benign essential hypertension Stable Continue home regiment  5 chronic GERD without esophagitis PPI daily   All the records are reviewed and case discussed with ED provider. Management  plans discussed with the patient, family and they are in agreement.  CODE STATUS: Code Status History    This patient does not have a recorded code status. Please follow your organizational policy for patients in this situation.       TOTAL TIME TAKING CARE OF THIS PATIENT: 45 minutes.    Avel Peace Jamell Laymon M.D on 07/28/2017   Between 7am to 6pm - Pager - [phone]  After 6pm go to [email or web address] - password [email or web address] [facility]  Office  [phone]  CC: Primary care physician; Venia Carbon, MD   Note: This dictation was prepared with Dragon dictation along with smaller phrase technology. Any transcriptional errors that result from this process are unintentional.

## 2017-07-28 NOTE — ED Triage Notes (Signed)
Pt in with c/o NVD and weakness. Pt reports started last pm.

## 2017-07-28 NOTE — ED Notes (Signed)
Pt c/o nausea since 930 last night. Pt reports 10-15 episodes of diarrhea today. Pt states she rarely has been able to make it to the bathroom before it "runs out." Pt currently denying pain but states she has constant nausea. Pt non-tender upon palpation. Pt reports black stools; denies taking iron supplements.

## 2017-07-28 NOTE — ED Notes (Signed)
Patient transported to CT 

## 2017-07-28 NOTE — ED Notes (Signed)
Pt report N/V/D since last night at 2130. Pt states that she is having watery stools. Pt denies blood in stools. Pt states that she was passing "black" stool all night then the color changed to yellow. Pt reports that she has had 10 episodes of vomiting and 10 or more episodes of diarrhea. Pt denies fever.   Patient in NAD at this time. Pt A & O x 4

## 2017-07-28 NOTE — ED Provider Notes (Signed)
[facility] Emergency Department Provider Note    First MD Initiated Contact with Patient 07/28/17 1505     (approximate)  I have reviewed the triage vital signs and the nursing notes.   HISTORY  Chief Complaint Emesis    HPI Angela [facility] is a 77 y.o. female with history of acid reflux presents to the ER with roughly 24 hours of nausea vomiting multiple episodes of diarrhea that started last night after eating.  Has not been able to keep down.  States that she has had more than 10 episodes of melanotic stool today.  Initially the stool was appearing female in particular last night but it is cleared to clear yellow stool.  She denies blood thinners.  Does not take NSAIDs.  No recent antibiotics.  Denies any pain or shortness of breath.  Past Medical History:  Diagnosis Date  . Cancer ([place])    stage II melanoma; right leg;face  . Diverticulosis of colon   . Gallstones   . GERD (gastroesophageal reflux disease)   . History of skin cancer   . Hypertension   . Normal stress echocardiogram 01/2006   echo 5/98, fairly normal  . Osteoarthritis   . Osteoporosis, unspecified   . S/P sclerotherapy of varicose veins 07/30/06   left leg, Dr. Eilleen Kempf   Family History  Problem Relation Age of Onset  . Alcohol abuse Mother   . Colon cancer Mother   . Osteoporosis Mother   . Alcohol abuse Father   . Diabetes Father   . Lung cancer Brother   . Ovarian cancer Maternal Grandmother   . Breast cancer Neg Hx    Past Surgical History:  Procedure Laterality Date  . CHOLECYSTECTOMY  2001  . FOOT SURGERY     right  . MELANOMA EXCISION  7/10   leg  . [place]   bilateral  . VARICOSE VEIN SURGERY  2004   right foot   Patient Active Problem List   Diagnosis Date Noted  . Diarrhea 09/08/2016  . Advance directive discussed with patient 11/14/2014  . Family history of malignant neoplasm of gastrointestinal tract 08/23/2012  . Osteoporosis   .  Routine general medical examination at a health care facility 12/31/2010  . GERD 10/17/2008  . Essential hypertension, benign 02/27/2007  . DIVERTICULOSIS, COLON 02/27/2007  . Osteoarthritis, generalized 02/27/2007  . SKIN CANCER, HX OF 02/27/2007      Prior to Admission medications   Medication Sig Start Date End Date Taking? Authorizing Provider  acetaminophen (TYLENOL) 500 MG tablet Take 500 mg by mouth every 6 (six) hours as needed (pain).    [provider]  calcium carbonate (OS-CAL) 600 MG TABS Take 600 mg by mouth daily.    [provider]  cholecalciferol (VITAMIN D) 1000 UNITS tablet Take 1,000 Units by mouth daily.     [provider]  EPINEPHrine 0.3 mg/0.3 mL IJ SOAJ injection Inject 0.3 mLs (0.3 mg total) into the muscle once. 04/25/15   Gareth Morgan, MD  lisinopril-hydrochlorothiazide (PRINZIDE,ZESTORETIC) 20-12.5 MG tablet Take 1 tablet by mouth daily. 07/06/17   Venia Carbon, MD  Multiple Vitamin (MULTIVITAMIN) tablet Take 1 tablet by mouth daily.      [provider]  Omega-3 Fatty Acids (FISH OIL PO) Take by mouth.    [provider]  omeprazole (PRILOSEC) 20 MG capsule TAKE 1 CAPSULE TWICE A DAY BEFORE MEALS 07/06/17   Venia Carbon, MD  Calcium-Magnesium (  CALCIUM MAGNESIUM 750) 300-300 MG TABS Take by mouth daily.   09/16/11  [provider]    Allergies Nsaids; Aspirin; and Codeine    Social History Social History   Tobacco Use  . Smoking status: Never Smoker  . Smokeless tobacco: Never Used  Substance Use Topics  . Alcohol use: No    Alcohol/week: 0.0 oz    Comment: Wine, rarely  . Drug use: No    Review of Systems Patient denies headaches, rhinorrhea, blurry vision, numbness, shortness of breath, chest pain, edema, cough, abdominal pain, nausea, vomiting, diarrhea, dysuria, fevers, rashes or hallucinations unless otherwise stated above in  HPI. ____________________________________________   PHYSICAL EXAM:  VITAL SIGNS: Vitals:   07/28/17 1503 07/28/17 1530  BP: 123/76 (!) 138/55  Pulse: (!) 101 81  Resp: 20 20  Temp: 98.4 F (36.9 C)   SpO2: 97% 95%    Constitutional: Alert and oriented. ill appearing but in no acute distress. Eyes: Conjunctivae are normal.  Head: Atraumatic. Nose: No congestion/rhinnorhea. Mouth/Throat: Mucous membranes are moist.   Neck: No stridor. Painless ROM.  Cardiovascular: Normal rate, regular rhythm. Grossly normal heart sounds.  Good peripheral circulation. Respiratory: Normal respiratory effort.  No retractions. Lungs CTAB. Gastrointestinal: Soft and nontender. No distention. No abdominal bruits. No CVA tenderness. Genitourinary:  Musculoskeletal: No lower extremity tenderness nor edema.  No joint effusions. Neurologic:  Normal speech and language. No gross focal neurologic deficits are appreciated. No facial droop Skin:  Skin is warm, dry and intact. No rash noted. Psychiatric: Mood and affect are normal. Speech and behavior are normal.  ____________________________________________   LABS (all labs ordered are listed, but only abnormal results are displayed)  Results for orders placed or performed during the hospital encounter of 07/28/17 (from the past 24 hour(s))  Lipase, blood     Status: None   Collection Time: 07/28/17  2:58 PM  Result Value Ref Range   Lipase 23 11 - 51 U/L  Comprehensive metabolic panel     Status: Abnormal   Collection Time: 07/28/17  2:58 PM  Result Value Ref Range   Sodium 138 135 - 145 mmol/L   Potassium 2.9 (L) 3.5 - 5.1 mmol/L   Chloride 99 (L) 101 - 111 mmol/L   CO2 24 22 - 32 mmol/L   Glucose, Bld 152 (H) 65 - 99 mg/dL   BUN 28 (H) 6 - 20 mg/dL   Creatinine, Ser 0.98 0.44 - 1.00 mg/dL   Calcium 9.7 8.9 - 10.3 mg/dL   Total Protein 7.9 6.5 - 8.1 g/dL   Albumin 4.5 3.5 - 5.0 g/dL   AST 35 15 - 41 U/L   ALT 28 14 - 54 U/L   Alkaline  Phosphatase 63 38 - 126 U/L   Total Bilirubin 0.8 0.3 - 1.2 mg/dL   GFR calc non Af Amer 55 (L) >60 mL/min   GFR calc Af Amer >60 >60 mL/min   Anion gap 15 5 - 15  CBC     Status: Abnormal   Collection Time: 07/28/17  2:58 PM  Result Value Ref Range   WBC 15.1 (H) 3.6 - 11.0 K/uL   RBC 5.29 (H) 3.80 - 5.20 MIL/uL   Hemoglobin 15.8 12.0 - 16.0 g/dL   HCT 47.4 (H) 35.0 - 47.0 %   MCV 89.6 80.0 - 100.0 fL   MCH 29.8 26.0 - 34.0 pg   MCHC 33.2 32.0 - 36.0 g/dL   RDW 12.2 11.5 - 14.5 %  Platelets 258 150 - 440 K/uL  Troponin I     Status: None   Collection Time: 07/28/17  3:08 PM  Result Value Ref Range   Troponin I <0.03 <0.03 ng/mL   ____________________________________________  EKG My review and personal interpretation at Time: 14:37   Indication: nausea  Rate: 85  Rhythm: sinus Axis: normal Other: anterolateral st and t wave changes that are more prominent than previous tracing in 2016 ____________________________________________  RADIOLOGY  I personally reviewed all radiographic images ordered to evaluate for the above acute complaints and reviewed radiology reports and findings.  These findings were personally discussed with the patient.  Please see medical record for radiology report.  ____________________________________________   PROCEDURES  Procedure(s) performed:  Procedures    Critical Care performed: no ____________________________________________   INITIAL IMPRESSION / ASSESSMENT AND PLAN / ED COURSE  Pertinent labs & imaging results that were available during my care of the patient were reviewed by me and considered in my medical decision making (see chart for details).  DDX: Enteritis, gastritis, reflux, GI bleed, ACS, diverticulitis, perforation  Angela [facility] is a 77 y.o. who presents to the ED with as described above.  Patient with mild tachycardia but normotensive.  No fever.  Abdominal exam with mild tenderness therefore CT imaging ordered to  evaluate for acute intra-abdominal process.  CT imaging shows diverticulosis but no acute inflammatory process and normal appendix.  No evidence of perforated ulcer.  Will check for C. difficile as well as stool PCR.  Certainly concern for component of upper GI bleed given her multiple episodes of melena but as it has turned to clear it seems less consistent with active bleed.  Will give dose of IV Protonix.  Based on her frailty and multiple episodes of diarrhea with concern for dehydration duration I do believe patient will benefit from admission to the hospital for further medical management will continue IV fluids for rehydration.  Have discussed with the patient and available family all diagnostics and treatments performed thus far and all questions were answered to the best of my ability. The patient demonstrates understanding and agreement with plan.       ____________________________________________   FINAL CLINICAL IMPRESSION(S) / ED DIAGNOSES  Final diagnoses:  Diarrhea of presumed infectious origin  Melena  Dehydration      NEW MEDICATIONS STARTED DURING THIS VISIT:  New Prescriptions   No medications on file     Note:  This document was prepared using Dragon voice recognition software and may include unintentional dictation errors.    Merlyn Lot, MD 07/28/17 2144

## 2017-07-29 DIAGNOSIS — R197 Diarrhea, unspecified: Secondary | ICD-10-CM

## 2017-07-29 LAB — CBC
HCT: 37.9 % (ref 35.0–47.0)
Hemoglobin: 13.2 g/dL (ref 12.0–16.0)
MCH: 30.8 pg (ref 26.0–34.0)
MCHC: 34.8 g/dL (ref 32.0–36.0)
MCV: 88.5 fL (ref 80.0–100.0)
Platelets: 179 10*3/uL (ref 150–440)
RBC: 4.28 MIL/uL (ref 3.80–5.20)
RDW: 12.4 % (ref 11.5–14.5)
WBC: 6.9 10*3/uL (ref 3.6–11.0)

## 2017-07-29 LAB — BASIC METABOLIC PANEL
Anion gap: 7 (ref 5–15)
BUN: 20 mg/dL (ref 6–20)
CO2: 23 mmol/L (ref 22–32)
Calcium: 8.1 mg/dL — ABNORMAL LOW (ref 8.9–10.3)
Chloride: 109 mmol/L (ref 101–111)
Creatinine, Ser: 0.81 mg/dL (ref 0.44–1.00)
GFR calc Af Amer: >60 mL/min (ref >60)
GFR calc non Af Amer: >60 mL/min (ref >60)
Glucose, Bld: 100 mg/dL — ABNORMAL HIGH (ref 65–99)
Potassium: 2.6 mmol/L — CL (ref 3.5–5.1)
Sodium: 139 mmol/L (ref 135–145)

## 2017-07-29 LAB — POTASSIUM: Potassium: 4.2 mmol/L (ref 3.5–5.1)

## 2017-07-29 LAB — MAGNESIUM: Magnesium: 1.7 mg/dL (ref 1.7–2.4)

## 2017-07-29 MED ORDER — POTASSIUM CHLORIDE 20 MEQ PO PACK
60.0000 meq | PACK | ORAL | Status: AC
  Administered 2017-07-29 (×2): 60 meq via ORAL
  Filled 2017-07-29 (×2): qty 3

## 2017-07-29 MED ORDER — POTASSIUM CHLORIDE 20 MEQ PO PACK: 40.0000 meq | PACK | Freq: Once | ORAL | Status: DC

## 2017-07-29 MED ORDER — LOPERAMIDE HCL 2 MG PO CAPS
4.0000 mg | ORAL_CAPSULE | ORAL | Status: DC
  Administered 2017-07-29 – 2017-07-30 (×5): 4 mg via ORAL
  Filled 2017-07-29 (×6): qty 2

## 2017-07-29 MED ORDER — POTASSIUM CHLORIDE IN NACL 40-0.9 MEQ/L-% IV SOLN
INTRAVENOUS | Status: DC
  Administered 2017-07-29 – 2017-07-30 (×2): 75 mL/h via INTRAVENOUS
  Filled 2017-07-29 (×4): qty 1000

## 2017-07-29 MED ORDER — DIPHENOXYLATE-ATROPINE 2.5-0.025 MG PO TABS
1.0000 | ORAL_TABLET | Freq: Four times a day (QID) | ORAL | Status: DC | PRN
  Administered 2017-07-30: 1 via ORAL
  Filled 2017-07-29: qty 1

## 2017-07-29 NOTE — Progress Notes (Signed)
Notified Dr. Estanislado Pandy patient potassium of 2.6. New order put in at this time.

## 2017-07-29 NOTE — Consult Note (Signed)
[name], MD [address]  [place]  [place], [place] [address]  Main: [phone]  Fax: [phone] Pager: [phone]   Consultation  Referring Provider:     No ref. provider found Primary Care Physician:  Venia Carbon, MD Primary Gastroenterologist:  Dr. Havery Moros        Reason for Consultation:  Acute Diarrhea and dark stools  Date of Admission:  07/28/2017 Date of Consultation:  07/29/2017         HPI:   Angela Bowen is a 77 y.o. female with history of chronic intermittent nonbloody diarrhea that started approximately in 07/2016. She had outpatient evaluation by GI at [facility] including stool studies for infectious etiology, upper endoscopy, colonoscopy with biopsies in 09/2016 which were unremarkable, no evidence of celiac disease. She stopped following GI since then because she felt she is not getting help. Patient reports that she had intermittent episodes of nonbloody diarrhea, several episodes per day that would last for couple of weeks and then resolve. She would have normal bowel movements in between. She denies any weight loss, loss of appetite. She reports that this episode started yesterday, symptoms including nausea vomiting followed by several episodes of watery diarrhea. She reports that her stools were initially darker but no longer today. She continues to have severe watery diarrhea. Her stool studies came back positive for norovirus. C. difficile was negative. GI consulted for dark stools. However, her hemoglobin is 13 which is at her baseline. She denies any upper GI symptoms, bloating, flatulence or steatorrhea. She was never a smoker in her life. She denies eating out or using antibiotics or recent travel  During this admission, she had CT A/P with contrast which did not reveal hepato pancreatico biliary pathology and GI tract appeared normal. She had hypokalemia and is being repleted. She had mild leukocytosis which is now normal. She reports that  her appetite has improved today and is asking if she can eat. She is not sure if she is lactose intolerant.   NSAIDs: None  Antiplts/Anticoagulants/Anti thrombotics: None  GI Procedures:  EGD and colonoscopy at [facility] 10/20/16 by Dr Malissa Hippo  A. Duodenal, rule out celiac, endoscopic biopsy:  Duodenal mucosa with no significant pathologic diagnosis. The villous architecture is preserved. No increased intraepithelial lymphocytes are identified.  B. Stomach, rule out Helicobacter pylori, endoscopic biopsy:  Gastric antral and oxyntic mucosa with chronic gastritis. No active gastritis is seen. No Helicobacter pylori organisms are seen on routine H&E stain.  C. Right colon, rule out microscopic colitis, endoscopic biopsy:  Colonic mucosa with no significant pathologic diagnosis. No active or chronic colitis is seen. No evidence of lymphocytic or collagenous colitis is seen.  D. Left colon, rule out microscopic colitis, endoscopic biopsy:  Colonic mucosa with no significant pathologic diagnosis. No active or chronic colitis is seen. No evidence of lymphocytic or collagenous colitis is seen.  Past Medical History:  Diagnosis Date  . Cancer ([place])    stage II melanoma; right leg;face  . Diverticulosis of colon   . Gallstones   . GERD (gastroesophageal reflux disease)   . History of skin cancer   . Hypertension   . Normal stress echocardiogram 01/2006   echo 5/98, fairly normal  . Osteoarthritis   . Osteoporosis, unspecified   . S/P sclerotherapy of varicose veins 07/30/06   left leg, Dr. Eilleen Kempf    Past Surgical History:  Procedure Laterality Date  . CHOLECYSTECTOMY  2001  . FOOT SURGERY  right  . MELANOMA EXCISION  7/10   leg  . [place]   bilateral  . VARICOSE VEIN SURGERY  2004   right foot    Prior to Admission medications   Medication Sig Start Date End Date Taking? Authorizing Provider  acetaminophen (TYLENOL) 500 MG tablet Take 500 mg by  mouth every 6 (six) hours as needed (pain).   Yes [provider]  calcium carbonate (OS-CAL) 600 MG TABS Take 600 mg by mouth daily.   Yes [provider]  cholecalciferol (VITAMIN D) 1000 UNITS tablet Take 1,000 Units by mouth daily.    Yes [provider]  EPINEPHrine 0.3 mg/0.3 mL IJ SOAJ injection Inject 0.3 mLs (0.3 mg total) into the muscle once. 04/25/15  Yes Gareth Morgan, MD  lisinopril-hydrochlorothiazide (PRINZIDE,ZESTORETIC) 20-12.5 MG tablet Take 1 tablet by mouth daily. 07/06/17  Yes Venia Carbon, MD  Multiple Vitamin (MULTIVITAMIN) tablet Take 1 tablet by mouth daily.     Yes [provider]  Omega-3 Fatty Acids (FISH OIL PO) Take by mouth.   Yes [provider]  omeprazole (PRILOSEC) 20 MG capsule TAKE 1 CAPSULE TWICE A DAY BEFORE MEALS 07/06/17  Yes Venia Carbon, MD  Calcium-Magnesium (CALCIUM MAGNESIUM 750) 300-300 MG TABS Take by mouth daily.   09/16/11  [provider]    Family History  Problem Relation Age of Onset  . Alcohol abuse Mother   . Colon cancer Mother   . Osteoporosis Mother   . Alcohol abuse Father   . Diabetes Father   . Lung cancer Brother   . Ovarian cancer Maternal Grandmother   . Breast cancer Neg Hx      Social History   Tobacco Use  . Smoking status: Never Smoker  . Smokeless tobacco: Never Used  Substance Use Topics  . Alcohol use: No    Alcohol/week: 0.0 oz    Comment: Wine, rarely  . Drug use: No    Allergies as of 07/28/2017 - Review Complete 07/28/2017  Allergen Reaction Noted  . Nsaids Anaphylaxis 11/17/2015  . Aspirin    . Codeine  01/19/2009    Review of Systems:    All systems reviewed and negative except where noted in HPI.   Physical Exam:  Vital signs in last 24 hours: Temp:  [98.8 F (37.1 C)-99.1 F (37.3 C)] 98.8 F (37.1 C) (02/02 1227) Pulse Rate:  [64-72] 64 (02/02 1227) Resp:  [20] 20 (02/02 0430) BP: (111-127)/(43-49) 111/43 (02/02  1227) SpO2:  [95 %-96 %] 96 % (02/02 1227) Last BM Date: 07/29/17 General:   Pleasant, cooperative in NAD Head:  Normocephalic and atraumatic. Eyes:   No icterus.   Conjunctiva pink. PERRLA. Ears:  Normal auditory acuity. Neck:  Supple; no masses or thyroidomegaly Lungs: Respirations even and unlabored. Lungs clear to auscultation bilaterally.   No wheezes, crackles, or rhonchi.  Heart:  Regular rate and rhythm;  Without murmur, clicks, rubs or gallops Abdomen:  Soft, nondistended, nontender. Normal bowel sounds. No appreciable masses or hepatomegaly.  No rebound or guarding.  Rectal:  Not performed. Msk:  Symmetrical without gross deformities.  Strength normal Extremities:  Without edema, cyanosis or clubbing. Neurologic:  Alert and oriented x3;  grossly normal neurologically. Skin:  Intact without significant lesions or rashes. Cervical Nodes:  No significant cervical adenopathy. Psych:  Alert and cooperative. Normal affect.  LAB RESULTS: CBC Latest Ref Rng & Units 07/29/2017 07/28/2017 08/31/2016  WBC 3.6 - 11.0 K/uL 6.9  15.1(H) 7.0  Hemoglobin 12.0 - 16.0 g/dL 13.2 15.8 13.7  Hematocrit 35.0 - 47.0 % 37.9 47.4(H) 39.9  Platelets 150 - 440 K/uL 179 258 263.0    BMET BMP Latest Ref Rng & Units 07/29/2017 07/29/2017 07/28/2017  Glucose 65 - 99 mg/dL - 100(H) 152(H)  BUN 6 - 20 mg/dL - 20 28(H)  Creatinine 0.44 - 1.00 mg/dL - 0.81 0.98  Sodium 135 - 145 mmol/L - 139 138  Potassium 3.5 - 5.1 mmol/L 4.2 2.6(LL) 2.9(L)  Chloride 101 - 111 mmol/L - 109 99(L)  CO2 22 - 32 mmol/L - 23 24  Calcium 8.9 - 10.3 mg/dL - 8.1(L) 9.7    LFT Hepatic Function Latest Ref Rng & Units 07/28/2017 08/31/2016 11/17/2015  Total Protein 6.5 - 8.1 g/dL 7.9 6.3 6.7  Albumin 3.5 - 5.0 g/dL 4.5 4.0 4.4  AST 15 - 41 U/L 35 22 30  ALT 14 - 54 U/L 28 23 39(H)  Alk Phosphatase 38 - 126 U/L 63 57 52  Total Bilirubin 0.3 - 1.2 mg/dL 0.8 0.4 0.4  Bilirubin, Direct 0.0 - 0.3 mg/dL - - -     STUDIES: Ct Abdomen Pelvis  W Contrast  Result Date: 07/28/2017 CLINICAL DATA:  Nausea and vomiting for 2 days EXAM: CT ABDOMEN AND PELVIS WITH CONTRAST TECHNIQUE: Multidetector CT imaging of the abdomen and pelvis was performed using the standard protocol following bolus administration of intravenous contrast. CONTRAST:  25m ISOVUE-300 IOPAMIDOL (ISOVUE-300) INJECTION 61% COMPARISON:  None. FINDINGS: Lower chest: No acute abnormality. Hepatobiliary: No focal liver abnormality is seen. Status post cholecystectomy. No biliary dilatation. Pancreas: Unremarkable. No pancreatic ductal dilatation or surrounding inflammatory changes. Spleen: Normal in size without focal abnormality. Adrenals/Urinary Tract: Adrenal glands are unremarkable. Kidneys are normal, without renal calculi, focal lesion, or hydronephrosis. Bladder is unremarkable. Stomach/Bowel: Diverticular change of the colon is noted without evidence of diverticulitis. No obstructive or inflammatory changes of the bowel are noted. The appendix is within normal limits. Vascular/Lymphatic: Aortic atherosclerosis. No enlarged abdominal or pelvic lymph nodes. Reproductive: Uterus and bilateral adnexa are unremarkable. Other: No abdominal wall hernia or abnormality. No abdominopelvic ascites. Musculoskeletal: Mild degenerative changes of lumbar spine are noted. IMPRESSION: Chronic changes without acute abnormality. Electronically Signed   By: MInez CatalinaM.D.   On: 07/28/2017 16:29      Impression / Plan:   Angela BENHAMis a 77y.o. female with no significant past medical history presents with acute nonbloody diarrhea secondary to norovirus enteritis. She does have history of chronic diarrhea which is consistent with diarrhea predominant IBS. She does not have any alarm signs or symptoms otherwise. Other differential is exocrine pancreatic insufficiency which can occur in advanced age population. There is no evidence of active GI bleed  - Continue supportive care for norovirus  enteritis - Monitor electrolytes and replete as needed - Start Imodium and Lomotil alternate every 4-6 hours - Lactose-free diet - Resume diet - Check pancreatic fecal elastase - I recommended her to start probiotics such as align or VSL# 3 as outpatient if her diarrhea persists - She can follow up with her primary GI or [place] [facility] in 1-2 weeks as outpatient  Thank you for involving me in the care of this patient. GI will Sign off for now and please call [email or web address] with questions    LOS: 1 day   RSherri Sear MD  07/29/2017, 7:36 PM   Note: This dictation was prepared with Dragon dictation along with  smaller phrase technology. Any transcriptional errors that result from this process are unintentional.

## 2017-07-29 NOTE — Progress Notes (Signed)
[place] [facility] [place] NAME: Angela Bowen    MR#:  [id number]  DATE OF BIRTH:  [birth year]  SUBJECTIVE:  CHIEF COMPLAINT:   Chief Complaint  Patient presents with  . Emesis  Patient continues to complain of watery diarrhea, potassium critically low at 2.6-addressed/being repleted with p.o./IV potassium, gastroenterology to see, no more lower GI bleeding per patient  REVIEW OF SYSTEMS:  CONSTITUTIONAL: No fever, fatigue or weakness.  EYES: No blurred or double vision.  EARS, NOSE, AND THROAT: No tinnitus or ear pain.  RESPIRATORY: No cough, shortness of breath, wheezing or hemoptysis.  CARDIOVASCULAR: No chest pain, orthopnea, edema.  GASTROINTESTINAL: No nausea, vomiting, diarrhea or abdominal pain.  GENITOURINARY: No dysuria, hematuria.  ENDOCRINE: No polyuria, nocturia,  HEMATOLOGY: No anemia, easy bruising or bleeding SKIN: No rash or lesion. MUSCULOSKELETAL: No joint pain or arthritis.   NEUROLOGIC: No tingling, numbness, weakness.  PSYCHIATRY: No anxiety or depression.   ROS  DRUG ALLERGIES:   Allergies  Allergen Reactions  . Nsaids Anaphylaxis  . Aspirin     High doses - intolerant  . Codeine     REACTION: rash    VITALS:  Blood pressure (!) 127/49, pulse 72, temperature 99.1 F (37.3 C), temperature source Oral, resp. rate 20, height 5\' 6"  (1.676 m), SpO2 95 %.  PHYSICAL EXAMINATION:  GENERAL:  77 y.o.-year-old patient lying in the bed with no acute distress.  EYES: Pupils equal, round, reactive to light and accommodation. No scleral icterus. Extraocular muscles intact.  HEENT: Head atraumatic, normocephalic. Oropharynx and nasopharynx clear.  NECK:  Supple, no jugular venous distention. No thyroid enlargement, no tenderness.  LUNGS: Normal breath sounds bilaterally, no wheezing, rales,rhonchi or crepitation. No use of accessory muscles of respiration.  CARDIOVASCULAR: S1, S2 normal. No murmurs, rubs, or gallops.   ABDOMEN: Soft, nontender, nondistended. Bowel sounds present. No organomegaly or mass.  EXTREMITIES: No pedal edema, cyanosis, or clubbing.  NEUROLOGIC: Cranial nerves II through XII are intact. Muscle strength 5/5 in all extremities. Sensation intact. Gait not checked.  PSYCHIATRIC: The patient is alert and oriented x 3.  SKIN: No obvious rash, lesion, or ulcer.   Physical Exam LABORATORY PANEL:   CBC Recent Labs  Lab 07/29/17 0437  WBC 6.9  HGB 13.2  HCT 37.9  PLT 179   ------------------------------------------------------------------------------------------------------------------  Chemistries  Recent Labs  Lab 07/28/17 1458 07/29/17 0437  NA 138 139  K 2.9* 2.6*  CL 99* 109  CO2 24 23  GLUCOSE 152* 100*  BUN 28* 20  CREATININE 0.98 0.81  CALCIUM 9.7 8.1*  MG  --  1.7  AST 35  --   ALT 28  --   ALKPHOS 63  --   BILITOT 0.8  --    ------------------------------------------------------------------------------------------------------------------  Cardiac Enzymes Recent Labs  Lab 07/28/17 1508  TROPONINI <0.03   ------------------------------------------------------------------------------------------------------------------  RADIOLOGY:  Ct Abdomen Pelvis W Contrast  Result Date: 07/28/2017 CLINICAL DATA:  Nausea and vomiting for 2 days EXAM: CT ABDOMEN AND PELVIS WITH CONTRAST TECHNIQUE: Multidetector CT imaging of the abdomen and pelvis was performed using the standard protocol following bolus administration of intravenous contrast. CONTRAST:  5mL ISOVUE-300 IOPAMIDOL (ISOVUE-300) INJECTION 61% COMPARISON:  None. FINDINGS: Lower chest: No acute abnormality. Hepatobiliary: No focal liver abnormality is seen. Status post cholecystectomy. No biliary dilatation. Pancreas: Unremarkable. No pancreatic ductal dilatation or surrounding inflammatory changes. Spleen: Normal in size without focal abnormality. Adrenals/Urinary Tract: Adrenal glands are unremarkable.  Kidneys are normal,  without renal calculi, focal lesion, or hydronephrosis. Bladder is unremarkable. Stomach/Bowel: Diverticular change of the colon is noted without evidence of diverticulitis. No obstructive or inflammatory changes of the bowel are noted. The appendix is within normal limits. Vascular/Lymphatic: Aortic atherosclerosis. No enlarged abdominal or pelvic lymph nodes. Reproductive: Uterus and bilateral adnexa are unremarkable. Other: No abdominal wall hernia or abnormality. No abdominopelvic ascites. Musculoskeletal: Mild degenerative changes of lumbar spine are noted. IMPRESSION: Chronic changes without acute abnormality. Electronically Signed   By: Inez Catalina M.D.   On: 07/28/2017 16:29    ASSESSMENT AND PLAN:  1 acute melena Resolving  On admission was associated nausea/vomiting Noted history of chronic diarrhea, upper and lower endoscopy done at [facility] with biopsies on April 2018 were normal studies per patient/husband, stool studies at that time were unremarkable, noted history of diverticulosis, CT abdomen was a negative study Continue  RNF bed, Protonix drip, CBC daily, transfuse as needed, gastroenterology to see, avoid NSAIDs/antiplatelet agents/anticoagulants, advance diet if okay with gastroenterology, and continue close medical monitoring   2 acute normal virus infection  GI panel noted for normal virus  Continue IV fluids for rehydration, antiemetics PRN   3 acute hypokalemia/hypochloremia Most likely secondary to diarrhea Worsening noted  Replete with p.o. potassium/IV fluids, check potassium level at noon, magnesium level was normal, BMP in the morning  4 chronic benign essential hypertension Stable Continue home regiment  5 chronic GERD without esophagitis PPI daily  All the records are reviewed and case discussed with Care Management/Social Workerr. Management plans discussed with the patient, family and they are in agreement.  CODE STATUS: full  TOTAL  TIME TAKING CARE OF THIS PATIENT: 40 minutes.     POSSIBLE D/C IN 2-3 DAYS, DEPENDING ON CLINICAL CONDITION.   Avel Peace Mazal Ebey M.D on 07/29/2017   Between 7am to 6pm - Pager - [phone]  After 6pm go to [email or web address] - password [email or web address] [facility]  Office  [phone]  CC: Primary care physician; Venia Carbon, MD  Note: This dictation was prepared with Dragon dictation along with smaller phrase technology. Any transcriptional errors that result from this process are unintentional.

## 2017-07-30 LAB — CBC
HCT: 38.3 % (ref 35.0–47.0)
Hemoglobin: 13.2 g/dL (ref 12.0–16.0)
MCH: 30.8 pg (ref 26.0–34.0)
MCHC: 34.4 g/dL (ref 32.0–36.0)
MCV: 89.5 fL (ref 80.0–100.0)
Platelets: 191 10*3/uL (ref 150–440)
RBC: 4.28 MIL/uL (ref 3.80–5.20)
RDW: 12.5 % (ref 11.5–14.5)
WBC: 6.3 10*3/uL (ref 3.6–11.0)

## 2017-07-30 MED ORDER — LOPERAMIDE HCL 2 MG PO CAPS: 4.0000 mg | ORAL_CAPSULE | ORAL | 0 refills | Status: DC

## 2017-07-30 MED ORDER — DIPHENOXYLATE-ATROPINE 2.5-0.025 MG PO TABS: 1.0000 | ORAL_TABLET | Freq: Four times a day (QID) | ORAL | 0 refills | Status: DC | PRN

## 2017-07-30 MED ORDER — ALIGN 4 MG PO CAPS: 1.0000 | ORAL_CAPSULE | Freq: Four times a day (QID) | ORAL | 0 refills | Status: DC

## 2017-07-30 NOTE — Discharge Summary (Signed)
[facility] [place] NAME: Angela Bowen    MR#:  [id number]  DATE OF BIRTH:  05-Feb-1941  DATE OF ADMISSION:  07/28/2017 ADMITTING PHYSICIAN: Avel Peace Kailee Essman, MD  DATE OF DISCHARGE: No discharge date for patient encounter.  PRIMARY CARE PHYSICIAN: Venia Carbon, MD    ADMISSION DIAGNOSIS:  Diarrhea of presumed infectious origin [R19.7] Dehydration [E86.0] Melena [K92.1]  DISCHARGE DIAGNOSIS:  Active Problems:   GIB (gastrointestinal bleeding)   SECONDARY DIAGNOSIS:   Past Medical History:  Diagnosis Date  . Cancer ([place])    stage II melanoma; right leg;face  . Diverticulosis of colon   . Gallstones   . GERD (gastroesophageal reflux disease)   . History of skin cancer   . Hypertension   . Normal stress echocardiogram 01/2006   echo 5/98, fairly normal  . Osteoarthritis   . Osteoporosis, unspecified   . S/P sclerotherapy of varicose veins 07/30/06   left leg, Dr. Eilleen Kempf    HOSPITAL COURSE:  1acute melena Resolved Etiology was unknown On admission was associated nausea/vomiting Noted history of chronic diarrhea, upper and lower endoscopy done at [facility] with biopsies on April 2018 were normal studies per patient/husband, stool studies at that time were unremarkable, noted history of diverticulosis, CT abdomen was a negative study Admitted to RNF bed, weaned off Protonix drip, gastroenterology did see patient while in house-recommended Lomotil/probiotics with outpatient follow-up per below   2acute Norovirus infection  GI panel noted for Norovirus  Treated with IV fluids and antiemetics while in house   3acute hypokalemia/hypochloremia Repleted  4chronic benign essential hypertension Stable Continued home regiment  5chronic GERD without esophagitis PPI daily  DISCHARGE CONDITIONS:  On day of discharge patient is afebrile, hemogram stable, tolerating diet, ready for discharge home in care of family, for  more specific details please see chart, patient to follow-up with gastroenterology in 1-2 weeks for reevaluation of acute on chronic diarrhea, follow with primary care provider in 2-3 days for reevaluation of all other chronic maladies, for more specific details please see chart  CONSULTS OBTAINED:  Treatment Team:  Lin Landsman, MD  DRUG ALLERGIES:   Allergies  Allergen Reactions  . Nsaids Anaphylaxis  . Aspirin     High doses - intolerant  . Codeine     REACTION: rash    DISCHARGE MEDICATIONS:   Allergies as of 07/30/2017      Reactions   Nsaids Anaphylaxis   Aspirin    High doses - intolerant   Codeine    REACTION: rash      Medication List    TAKE these medications   acetaminophen 500 MG tablet Commonly known as:  TYLENOL Take 500 mg by mouth every 6 (six) hours as needed (pain).   ALIGN 4 MG Caps Take 1 capsule (4 mg total) by mouth 4 (four) times daily.   calcium carbonate 600 MG Tabs tablet Commonly known as:  OS-CAL Take 600 mg by mouth daily.   cholecalciferol 1000 units tablet Commonly known as:  VITAMIN D Take 1,000 Units by mouth daily.   diphenoxylate-atropine 2.5-0.025 MG tablet Commonly known as:  LOMOTIL Take 1 tablet by mouth 4 (four) times daily as needed for diarrhea or loose stools.   EPINEPHrine 0.3 mg/0.3 mL Soaj injection Commonly known as:  EPI-PEN Inject 0.3 mLs (0.3 mg total) into the muscle once.   FISH OIL PO Take by mouth.   lisinopril-hydrochlorothiazide 20-12.5 MG tablet Commonly known as:  PRINZIDE,ZESTORETIC Take 1 tablet by mouth daily.   loperamide 2 MG capsule Commonly known as:  IMODIUM Take 2 capsules (4 mg total) by mouth every 4 (four) hours.   multivitamin tablet Take 1 tablet by mouth daily.   omeprazole 20 MG capsule Commonly known as:  PRILOSEC TAKE 1 CAPSULE TWICE A DAY BEFORE MEALS        DISCHARGE INSTRUCTIONS:    If you experience worsening of your admission symptoms, develop shortness  of breath, life threatening emergency, suicidal or homicidal thoughts you must seek medical attention immediately by calling 911 or calling your MD immediately  if symptoms less severe.  You Must read complete instructions/literature along with all the possible adverse reactions/side effects for all the Medicines you take and that have been prescribed to you. Take any new Medicines after you have completely understood and accept all the possible adverse reactions/side effects.   Please note  You were cared for by a hospitalist during your hospital stay. If you have any questions about your discharge medications or the care you received while you were in the hospital after you are discharged, you can call the unit and asked to speak with the hospitalist on call if the hospitalist that took care of you is not available. Once you are discharged, your primary care physician will handle any further medical issues. Please note that NO REFILLS for any discharge medications will be authorized once you are discharged, as it is imperative that you return to your primary care physician (or establish a relationship with a primary care physician if you do not have one) for your aftercare needs so that they can reassess your need for medications and monitor your lab values.    Today   CHIEF COMPLAINT:   Chief Complaint  Patient presents with  . Emesis    HISTORY OF PRESENT ILLNESS:  77 y.o. female with a known history of chronic diarrhea, history of diverticulosis, most recent upper and lower endoscopy with biopsy done at [facility] in April 2018 which was a normal study per patient/husband, followed by Dr. Brainards/gastroenterology locally, presents today with acute nausea/vomiting and diarrhea since 2100 hrs. yesterday, initially started with black/dark stools-changing to yellow, in the emergency room hemoglobin 5.8, white count 15, potassium 2.9, chloride 99, CT abdomen negative, patient evaluated emergency room,  husband at the bedside, patient is now been admitted for acute melena with associated nausea/vomiting, hypokalemia, and history of chronic diarrhea.  VITAL SIGNS:  Blood pressure (!) 138/47, pulse 65, temperature 98.5 F (36.9 C), temperature source Oral, resp. rate 18, height 5\' 6"  (1.676 m), SpO2 97 %.  I/O:    Intake/Output Summary (Last 24 hours) at 07/30/2017 1040 Last data filed at 07/30/2017 0517 Gross per 24 hour  Intake 543 ml  Output -  Net 543 ml    PHYSICAL EXAMINATION:  GENERAL:  77 y.o.-year-old patient lying in the bed with no acute distress.  EYES: Pupils equal, round, reactive to light and accommodation. No scleral icterus. Extraocular muscles intact.  HEENT: Head atraumatic, normocephalic. Oropharynx and nasopharynx clear.  NECK:  Supple, no jugular venous distention. No thyroid enlargement, no tenderness.  LUNGS: Normal breath sounds bilaterally, no wheezing, rales,rhonchi or crepitation. No use of accessory muscles of respiration.  CARDIOVASCULAR: S1, S2 normal. No murmurs, rubs, or gallops.  ABDOMEN: Soft, non-tender, non-distended. Bowel sounds present. No organomegaly or mass.  EXTREMITIES: No pedal edema, cyanosis, or clubbing.  NEUROLOGIC: Cranial nerves II through XII are intact. Muscle strength 5/5  in all extremities. Sensation intact. Gait not checked.  PSYCHIATRIC: The patient is alert and oriented x 3.  SKIN: No obvious rash, lesion, or ulcer.   DATA REVIEW:   CBC Recent Labs  Lab 07/30/17 0757  WBC 6.3  HGB 13.2  HCT 38.3  PLT 191    Chemistries  Recent Labs  Lab 07/28/17 1458 07/29/17 0437 07/29/17 1228  NA 138 139  --   K 2.9* 2.6* 4.2  CL 99* 109  --   CO2 24 23  --   GLUCOSE 152* 100*  --   BUN 28* 20  --   CREATININE 0.98 0.81  --   CALCIUM 9.7 8.1*  --   MG  --  1.7  --   AST 35  --   --   ALT 28  --   --   ALKPHOS 63  --   --   BILITOT 0.8  --   --     Cardiac Enzymes Recent Labs  Lab 07/28/17 1508  TROPONINI <0.03     Microbiology Results  Results for orders placed or performed during the hospital encounter of 07/28/17  C difficile quick scan w PCR reflex     Status: None   Collection Time: 07/28/17  3:29 PM  Result Value Ref Range Status   C Diff antigen NEGATIVE NEGATIVE Final   C Diff toxin NEGATIVE NEGATIVE Final   C Diff interpretation No C. difficile detected.  Final    Comment: Performed at [facility], [address] [place], [place] [address]  Gastrointestinal Panel by PCR , Stool     Status: Abnormal   Collection Time: 07/28/17  3:29 PM  Result Value Ref Range Status   Campylobacter species NOT DETECTED NOT DETECTED Final   Plesimonas shigelloides NOT DETECTED NOT DETECTED Final   Salmonella species NOT DETECTED NOT DETECTED Final   Yersinia enterocolitica NOT DETECTED NOT DETECTED Final   Vibrio species NOT DETECTED NOT DETECTED Final   Vibrio cholerae NOT DETECTED NOT DETECTED Final   Enteroaggregative E coli (EAEC) NOT DETECTED NOT DETECTED Final   Enteropathogenic E coli (EPEC) NOT DETECTED NOT DETECTED Final   Enterotoxigenic E coli (ETEC) NOT DETECTED NOT DETECTED Final   Shiga like toxin producing E coli (STEC) NOT DETECTED NOT DETECTED Final   Shigella/Enteroinvasive E coli (EIEC) NOT DETECTED NOT DETECTED Final   Cryptosporidium NOT DETECTED NOT DETECTED Final   Cyclospora cayetanensis NOT DETECTED NOT DETECTED Final   Entamoeba histolytica NOT DETECTED NOT DETECTED Final   Giardia lamblia NOT DETECTED NOT DETECTED Final   Adenovirus F40/41 NOT DETECTED NOT DETECTED Final   Astrovirus NOT DETECTED NOT DETECTED Final   Norovirus GI/GII DETECTED (A) NOT DETECTED Final    Comment: RESULT CALLED TO, READ BACK BY AND VERIFIED WITH: LYDIA BOATENG AT 2312 07/28/17.PMH    Rotavirus A NOT DETECTED NOT DETECTED Final   Sapovirus (I, II, IV, and V) NOT DETECTED NOT DETECTED Final    Comment: Performed at [facility], [address] [place], [place] [address]     RADIOLOGY:  Ct Abdomen Pelvis W Contrast  Result Date: 07/28/2017 CLINICAL DATA:  Nausea and vomiting for 2 days EXAM: CT ABDOMEN AND PELVIS WITH CONTRAST TECHNIQUE: Multidetector CT imaging of the abdomen and pelvis was performed using the standard protocol following bolus administration of intravenous contrast. CONTRAST:  17mL ISOVUE-300 IOPAMIDOL (ISOVUE-300) INJECTION 61% COMPARISON:  None. FINDINGS: Lower chest: No acute abnormality. Hepatobiliary: No focal liver abnormality  is seen. Status post cholecystectomy. No biliary dilatation. Pancreas: Unremarkable. No pancreatic ductal dilatation or surrounding inflammatory changes. Spleen: Normal in size without focal abnormality. Adrenals/Urinary Tract: Adrenal glands are unremarkable. Kidneys are normal, without renal calculi, focal lesion, or hydronephrosis. Bladder is unremarkable. Stomach/Bowel: Diverticular change of the colon is noted without evidence of diverticulitis. No obstructive or inflammatory changes of the bowel are noted. The appendix is within normal limits. Vascular/Lymphatic: Aortic atherosclerosis. No enlarged abdominal or pelvic lymph nodes. Reproductive: Uterus and bilateral adnexa are unremarkable. Other: No abdominal wall hernia or abnormality. No abdominopelvic ascites. Musculoskeletal: Mild degenerative changes of lumbar spine are noted. IMPRESSION: Chronic changes without acute abnormality. Electronically Signed   By: Inez Catalina M.D.   On: 07/28/2017 16:29    EKG:   Orders placed or performed during the hospital encounter of 07/28/17  . EKG 12-Lead  . EKG 12-Lead      Management plans discussed with the patient, family and they are in agreement.  CODE STATUS:     Code Status Orders  (From admission, onward)        Start     Ordered   07/28/17 1840  Full code  Continuous     07/28/17 1839    Code Status History    Date Active Date Inactive Code Status Order ID Comments User Context   This patient has a  current code status but no historical code status.    Advance Directive Documentation     Most Recent Value  Type of Advance Directive  Living will  Pre-existing out of facility DNR order (yellow form or pink MOST form)  No data  "MOST" Form in Place?  No data      TOTAL TIME TAKING CARE OF THIS PATIENT: 45 minutes.    Avel Peace Senon Nixon M.D on 07/30/2017 at 10:40 AM  Between 7am to 6pm - Pager - [phone]  After 6pm go to [email or web address] - password [email or web address]  Office  [phone]  CC: Primary care physician; Venia Carbon, MD   Note: This dictation was prepared with Dragon dictation along with smaller phrase technology. Any transcriptional errors that result from this process are unintentional.

## 2017-07-30 NOTE — Progress Notes (Signed)
Home in next few hours, number of stools had degreased a lot since yesterday-after immodium, etc started.

## 2017-07-31 ENCOUNTER — Ambulatory Visit: Payer: Medicare Other | Admitting: Primary Care

## 2017-08-01 ENCOUNTER — Telehealth: Payer: Self-pay | Admitting: *Deleted

## 2017-08-01 LAB — PANCREATIC ELASTASE, FECAL

## 2017-08-01 NOTE — Telephone Encounter (Signed)
Transition Care Management Follow-up Telephone Call   Date discharged? 07/30/2017   How have you been since you were released from the hospital? "ok but weak"   Do you understand why you were in the hospital? yes   Do you understand the discharge instructions? yes   Where were you discharged to? home     Functional Questionnaire:   Activities of Daily Living (ADLs):   She states they are independent in the following: independent in all areas    Any transportation issues/concerns?: no   Any patient concerns? no   Confirmed importance and date/time of follow-up visits scheduled yes  Provider Appointment booked with [facility] 08/14/16 @1600   Confirmed with patient if condition begins to worsen call PCP or go to the ER.  Patient was given the office number and encouraged to call back with question or concerns.  : yes

## 2017-08-01 NOTE — Telephone Encounter (Signed)
Lm requesting return call to complete TCM and confirm hosp f/u appt  

## 2017-08-01 NOTE — Telephone Encounter (Signed)
Pt returning [facility] call.

## 2017-08-14 ENCOUNTER — Ambulatory Visit (INDEPENDENT_AMBULATORY_CARE_PROVIDER_SITE_OTHER): Payer: Medicare Other | Admitting: Internal Medicine

## 2017-08-14 ENCOUNTER — Encounter: Payer: Self-pay | Admitting: Internal Medicine

## 2017-08-14 DIAGNOSIS — A0811 Acute gastroenteropathy due to Norwalk agent: Secondary | ICD-10-CM | POA: Diagnosis not present

## 2017-08-14 NOTE — Patient Instructions (Signed)
You can try 2-3 lactaid pills before food with milk (like yogurt or cheese).

## 2017-08-14 NOTE — Assessment & Plan Note (Signed)
With dehydration and electrolyte abnormalities Was rehydrated Now feels better No more diarrhea Reviewed all records---will hold off on further blood tests unless recurs

## 2017-08-14 NOTE — Progress Notes (Signed)
Subjective:    Patient ID: Angela Bowen, female    DOB: [birth year], 77 y.o.   MRN: [id number]  HPI Here for hospital follow up  Her chronic bowel problems had improved Trying to eliminate certain foods----improved off the dairy  Got sick suddenly Did feel right---then suddenly vomiting and diarrhea Quickly just watery---multiple times the first night In trouble the next day--couldn't tolerate fluids  Brought to ER Admitted---dehydration and electrolyte disturbances IV fluids/lomotil Culture showed norovirus Improved with fluids, etc Able to go home on the 3rd day  Reviewed the hospital records Stool was black but no blood I am unsure if this was truly melena  Doing well now No more diarrhea No regular meds--but will use the imodium if she is getting urgency  Current Outpatient Medications on File Prior to Visit  Medication Sig Dispense Refill  . acetaminophen (TYLENOL) 500 MG tablet Take 500 mg by mouth every 6 (six) hours as needed (pain).    . calcium carbonate (OS-CAL) 600 MG TABS Take 600 mg by mouth daily.    . cholecalciferol (VITAMIN D) 1000 UNITS tablet Take 1,000 Units by mouth daily.     . diphenoxylate-atropine (LOMOTIL) 2.5-0.025 MG tablet Take 1 tablet by mouth 4 (four) times daily as needed for diarrhea or loose stools. 30 tablet 0  . EPINEPHrine 0.3 mg/0.3 mL IJ SOAJ injection Inject 0.3 mLs (0.3 mg total) into the muscle once. 2 Device 0  . lisinopril-hydrochlorothiazide (PRINZIDE,ZESTORETIC) 20-12.5 MG tablet Take 1 tablet by mouth daily. 90 tablet 1  . loperamide (IMODIUM) 2 MG capsule Take 2 capsules (4 mg total) by mouth every 4 (four) hours. 30 capsule 0  . Multiple Vitamin (MULTIVITAMIN) tablet Take 1 tablet by mouth daily.      . Omega-3 Fatty Acids (FISH OIL PO) Take by mouth.    Marland Kitchen omeprazole (PRILOSEC) 20 MG capsule TAKE 1 CAPSULE TWICE A DAY BEFORE MEALS 180 capsule 3  . Probiotic Product (ALIGN) 4 MG CAPS Take 1 capsule (4 mg total) by mouth  4 (four) times daily. 30 capsule 0  . [DISCONTINUED] Calcium-Magnesium (CALCIUM MAGNESIUM 750) 300-300 MG TABS Take by mouth daily.      No current facility-administered medications on file prior to visit.     Allergies  Allergen Reactions  . Nsaids Anaphylaxis  . Aspirin     High doses - intolerant  . Codeine     REACTION: rash    Past Medical History:  Diagnosis Date  . Cancer ([place])    stage II melanoma; right leg;face  . Diverticulosis of colon   . Gallstones   . GERD (gastroesophageal reflux disease)   . History of skin cancer   . Hypertension   . Normal stress echocardiogram 01/2006   echo 5/98, fairly normal  . Osteoarthritis   . Osteoporosis, unspecified   . S/P sclerotherapy of varicose veins 07/30/06   left leg, Dr. Eilleen Kempf    Past Surgical History:  Procedure Laterality Date  . CHOLECYSTECTOMY  2001  . FOOT SURGERY     right  . MELANOMA EXCISION  7/10   leg  . VARICOSE VEIN SURGERY  1984   bilateral  . VARICOSE VEIN SURGERY  2004   right foot    Family History  Problem Relation Age of Onset  . Alcohol abuse Mother   . Colon cancer Mother   . Osteoporosis Mother   . Alcohol abuse Father   . Diabetes Father   . Lung cancer Brother   .  Ovarian cancer Maternal Grandmother   . Breast cancer Neg Hx     Social History   Socioeconomic History  . Marital status: Married    Spouse name: Not on file  . Number of children: 2  . Years of education: Not on file  . Highest education level: Not on file  Social Needs  . Financial resource strain: Not on file  . Food insecurity - worry: Not on file  . Food insecurity - inability: Not on file  . Transportation needs - medical: Not on file  . Transportation needs - non-medical: Not on file  Occupational History  . Occupation: homemaker  Tobacco Use  . Smoking status: Never Smoker  . Smokeless tobacco: Never Used  Substance and Sexual Activity  . Alcohol use: No    Alcohol/week: 0.0 oz    Comment: Wine,  rarely  . Drug use: No  . Sexual activity: No  Other Topics Concern  . Not on file  Social History Narrative   Has living will   No designated health care POA--requests husband   Would accept resuscitation attempts   No tube feeds if cognitively unaware   Review of Systems No fever No cough or SOB Appetite is fairly good now    Objective:   Physical Exam  Constitutional: She appears well-developed. No distress.  Neck: No thyromegaly present.  Cardiovascular: Normal rate, regular rhythm and normal heart sounds. Exam reveals no gallop.  No murmur heard. Pulmonary/Chest: Effort normal and breath sounds normal. No respiratory distress. She has no wheezes. She has no rales.  Abdominal: Soft. Bowel sounds are normal. She exhibits no distension. There is no tenderness. There is no rebound and no guarding.  Musculoskeletal: She exhibits no edema.  Lymphadenopathy:    She has no cervical adenopathy.  Psychiatric: She has a normal mood and affect. Her behavior is normal.          Assessment & Plan:

## 2017-10-10 ENCOUNTER — Encounter: Payer: Self-pay | Admitting: Gastroenterology

## 2017-11-22 ENCOUNTER — Ambulatory Visit: Payer: Medicare Other

## 2017-12-11 ENCOUNTER — Encounter: Payer: Medicare Other | Admitting: Internal Medicine

## 2017-12-15 ENCOUNTER — Ambulatory Visit (INDEPENDENT_AMBULATORY_CARE_PROVIDER_SITE_OTHER): Payer: Medicare Other | Admitting: Internal Medicine

## 2017-12-15 ENCOUNTER — Encounter: Payer: Self-pay | Admitting: Internal Medicine

## 2017-12-15 VITALS — BP 136/80 | HR 65 | Temp 98.0°F | Ht 60.25 in | Wt 114.0 lb

## 2017-12-15 DIAGNOSIS — I10 Essential (primary) hypertension: Secondary | ICD-10-CM | POA: Diagnosis not present

## 2017-12-15 DIAGNOSIS — Z0000 Encounter for general adult medical examination without abnormal findings: Secondary | ICD-10-CM

## 2017-12-15 DIAGNOSIS — Z7189 Other specified counseling: Secondary | ICD-10-CM

## 2017-12-15 DIAGNOSIS — K219 Gastro-esophageal reflux disease without esophagitis: Secondary | ICD-10-CM | POA: Diagnosis not present

## 2017-12-15 DIAGNOSIS — Z23 Encounter for immunization: Secondary | ICD-10-CM | POA: Diagnosis not present

## 2017-12-15 DIAGNOSIS — M159 Polyosteoarthritis, unspecified: Secondary | ICD-10-CM | POA: Diagnosis not present

## 2017-12-15 DIAGNOSIS — Z1211 Encounter for screening for malignant neoplasm of colon: Secondary | ICD-10-CM

## 2017-12-15 LAB — CBC
HCT: 41.3 % (ref 36.0–46.0)
Hemoglobin: 14.1 g/dL (ref 12.0–15.0)
MCHC: 34.2 g/dL (ref 30.0–36.0)
MCV: 90.2 fl (ref 78.0–100.0)
Platelets: 257 10*3/uL (ref 150.0–400.0)
RBC: 4.58 Mil/uL (ref 3.87–5.11)
RDW: 12.6 % (ref 11.5–15.5)
WBC: 8 10*3/uL (ref 4.0–10.5)

## 2017-12-15 LAB — COMPREHENSIVE METABOLIC PANEL
ALT: 20 U/L (ref 0–35)
AST: 19 U/L (ref 0–37)
Albumin: 4.3 g/dL (ref 3.5–5.2)
Alkaline Phosphatase: 61 U/L (ref 39–117)
BUN: 14 mg/dL (ref 6–23)
CO2: 29 mEq/L (ref 19–32)
Calcium: 9.4 mg/dL (ref 8.4–10.5)
Chloride: 102 mEq/L (ref 96–112)
Creatinine, Ser: 0.89 mg/dL (ref 0.40–1.20)
GFR: 65.32 mL/min (ref >60.00)
Glucose, Bld: 108 mg/dL — ABNORMAL HIGH (ref 70–99)
Potassium: 4.3 mEq/L (ref 3.5–5.1)
Sodium: 138 mEq/L (ref 135–145)
Total Bilirubin: 0.5 mg/dL (ref 0.2–1.2)
Total Protein: 6.8 g/dL (ref 6.0–8.3)

## 2017-12-15 LAB — T4, FREE: Free T4: 0.94 ng/dL (ref 0.60–1.60)

## 2017-12-15 MED ORDER — LISINOPRIL-HYDROCHLOROTHIAZIDE 20-12.5 MG PO TABS: 1.0000 | ORAL_TABLET | Freq: Every day | ORAL | 3 refills | Status: DC

## 2017-12-15 NOTE — Assessment & Plan Note (Signed)
Right foot and hands Occasional aleve or ibuprofen

## 2017-12-15 NOTE — Addendum Note (Signed)
Addended by: Pilar Grammes on: 12/15/2017 12:57 PM   Modules accepted: Orders

## 2017-12-15 NOTE — Assessment & Plan Note (Signed)
Quiet on the PPI 

## 2017-12-15 NOTE — Assessment & Plan Note (Signed)
BP Readings from Last 3 Encounters:  12/15/17 136/80  08/14/17 118/74  07/30/17 138/61   Good control Due for labs

## 2017-12-15 NOTE — Progress Notes (Signed)
Hearing Screening   Method: Audiometry   125Hz  250Hz  500Hz  1000Hz  2000Hz  3000Hz  4000Hz  6000Hz  8000Hz   Right ear:   40 40 40  0    Left ear:   40 0 40  0    Vision Screening Comments: November 2018

## 2017-12-15 NOTE — Assessment & Plan Note (Signed)
See social history 

## 2017-12-15 NOTE — Assessment & Plan Note (Signed)
I have personally reviewed the Medicare Annual Wellness questionnaire and have noted 1. The patient's medical and social history 2. Their use of alcohol, tobacco or illicit drugs 3. Their current medications and supplements 4. The patient's functional ability including ADL's, fall risks, home safety risks and hearing or visual             impairment. 5. Diet and physical activities 6. Evidence for depression or mood disorders  The patients weight, height, BMI and visual acuity have been recorded in the chart I have made referrals, counseling and provided education to the patient based review of the above and I have provided the pt with a written personalized care plan for preventive services.  I have provided you with a copy of your personalized plan for preventive services. Please take the time to review along with your updated medication list.  Pneumovax booster today Yearly flu vaccine Will consider shingrix Mammogram due 9/20 Will do FIT--- doesn't want colon now (unless positive) Discussed exercise

## 2017-12-15 NOTE — Progress Notes (Signed)
Subjective:    Patient ID: Angela Bowen, female    DOB: Apr 02, 1941, 77 y.o.   MRN: [id number]  HPI Here for Medicare wellness visit and follow up of chronic health conditions Reviewed form and advanced directives Reviewed other doctors Vision is okay Hearing is not great---needs some things repeated No exercise--- discussed. Got burning in feet when walking No falls No depression or anhedonia No memory problems that are worrisome Independent with instrumental ADLs  Still on BP med No chest pain or SOB No dizziness or syncope No edema No palpitations No headaches  Takes omeprazole daily No heartburn on this Occasionally gets choked when swallowing--non specific  Arthritis in right foot Limited her activity Got expensive brace---can't wear it Some pain in hands as well  Current Outpatient Medications on File Prior to Visit  Medication Sig Dispense Refill  . acetaminophen (TYLENOL) 500 MG tablet Take 500 mg by mouth every 6 (six) hours as needed (pain).    . calcium carbonate (OS-CAL) 600 MG TABS Take 600 mg by mouth daily.    . cholecalciferol (VITAMIN D) 1000 UNITS tablet Take 1,000 Units by mouth daily.     Marland Kitchen EPINEPHrine 0.3 mg/0.3 mL IJ SOAJ injection Inject 0.3 mLs (0.3 mg total) into the muscle once. 2 Device 0  . lisinopril-hydrochlorothiazide (PRINZIDE,ZESTORETIC) 20-12.5 MG tablet Take 1 tablet by mouth daily. 90 tablet 1  . Multiple Vitamin (MULTIVITAMIN) tablet Take 1 tablet by mouth daily.      . Omega-3 Fatty Acids (FISH OIL PO) Take by mouth.    Marland Kitchen omeprazole (PRILOSEC) 20 MG capsule TAKE 1 CAPSULE TWICE A DAY BEFORE MEALS 180 capsule 3  . [DISCONTINUED] Calcium-Magnesium (CALCIUM MAGNESIUM 750) 300-300 MG TABS Take by mouth daily.      No current facility-administered medications on file prior to visit.     Allergies  Allergen Reactions  . Nsaids Anaphylaxis  . Aspirin     High doses - intolerant  . Codeine     REACTION: rash    Past Medical  History:  Diagnosis Date  . Cancer ([place])    stage II melanoma; right leg;face  . Diverticulosis of colon   . Gallstones   . GERD (gastroesophageal reflux disease)   . History of skin cancer   . Hypertension   . Normal stress echocardiogram 01/2006   echo 5/98, fairly normal  . Osteoarthritis   . Osteoporosis, unspecified   . S/P sclerotherapy of varicose veins 07/30/06   left leg, Dr. Eilleen Kempf    Past Surgical History:  Procedure Laterality Date  . CHOLECYSTECTOMY  2001  . FOOT SURGERY     right  . MELANOMA EXCISION  7/10   leg  . VARICOSE VEIN SURGERY  1984   bilateral  . VARICOSE VEIN SURGERY  2004   right foot    Family History  Problem Relation Age of Onset  . Alcohol abuse Mother   . Colon cancer Mother   . Osteoporosis Mother   . Alcohol abuse Father   . Diabetes Father   . Lung cancer Brother   . Ovarian cancer Maternal Grandmother   . Breast cancer Neg Hx     Social History   Socioeconomic History  . Marital status: Married    Spouse name: Not on file  . Number of children: 2  . Years of education: Not on file  . Highest education level: Not on file  Occupational History  . Occupation: homemaker  Social Needs  . Financial  resource strain: Not on file  . Food insecurity:    Worry: Not on file    Inability: Not on file  . Transportation needs:    Medical: Not on file    Non-medical: Not on file  Tobacco Use  . Smoking status: Never Smoker  . Smokeless tobacco: Never Used  Substance and Sexual Activity  . Alcohol use: No    Alcohol/week: 0.0 oz    Comment: Wine, rarely  . Drug use: No  . Sexual activity: Never  Lifestyle  . Physical activity:    Days per week: Not on file    Minutes per session: Not on file  . Stress: Not on file  Relationships  . Social connections:    Talks on phone: Not on file    Gets together: Not on file    Attends religious service: Not on file    Active member of club or organization: Not on file    Attends  meetings of clubs or organizations: Not on file    Relationship status: Not on file  . Intimate partner violence:    Fear of current or ex partner: Not on file    Emotionally abused: Not on file    Physically abused: Not on file    Forced sexual activity: Not on file  Other Topics Concern  . Not on file  Social History Narrative   Has living will   No designated health care POA--requests husband. Alternate would be son [name]   Would accept resuscitation attempts   No tube feeds if cognitively unaware   Review of Systems Appetite is okay Weight stable Sleeps well Wears seat belt Teeth okay---sees dentist (recent bridge) No active skin issues--sees derm Bowels are not quite back to normal---diarrhea finally better Voids okay. No incontinence No other joint problems    Objective:   Physical Exam  Constitutional: She is oriented to person, place, and time. She appears well-developed. No distress.  HENT:  Mouth/Throat: Oropharynx is clear and moist. No oropharyngeal exudate.  Neck: No thyromegaly present.  Cardiovascular: Normal rate, regular rhythm, normal heart sounds and intact distal pulses. Exam reveals no gallop.  No murmur heard. Respiratory: Effort normal and breath sounds normal. No respiratory distress. She has no wheezes. She has no rales.  GI: Soft. There is no tenderness.  Musculoskeletal: She exhibits no edema or tenderness.  Lymphadenopathy:    She has no cervical adenopathy.  Neurological: She is alert and oriented to person, place, and time.  President--- "Peri [place]" [phone] D-l-r-o-w Recall 3/3  Skin: No rash noted. No erythema.  Psychiatric: She has a normal mood and affect. Her behavior is normal.           Assessment & Plan:

## 2017-12-21 ENCOUNTER — Other Ambulatory Visit (INDEPENDENT_AMBULATORY_CARE_PROVIDER_SITE_OTHER): Payer: Medicare Other

## 2017-12-21 DIAGNOSIS — Z1211 Encounter for screening for malignant neoplasm of colon: Secondary | ICD-10-CM | POA: Diagnosis not present

## 2017-12-21 LAB — FECAL OCCULT BLOOD, IMMUNOCHEMICAL: Fecal Occult Bld: NEGATIVE

## 2018-02-02 ENCOUNTER — Other Ambulatory Visit: Payer: Self-pay | Admitting: Internal Medicine

## 2018-02-02 DIAGNOSIS — I10 Essential (primary) hypertension: Secondary | ICD-10-CM

## 2018-02-03 IMAGING — MR MR FOOT*R* W/O CM
4 of 5 series · 23 of 40 positions shown · non-contrast
Comparison: Radiographs 03/30/2015

CLINICAL DATA: Worsening right foot pain for the past year. History
of prior surgeries.

EXAM:
MRI OF THE RIGHT FOREFOOT WITHOUT CONTRAST
TECHNIQUE: Multiplanar, multisequence MR imaging was performed. No intravenous
contrast was administered.

[Series 3: PD fat-sat · axial · 3.5mm · 0.31mm/px · z∈[-56,+45]mm · 8 of 24 slices shown]
[im 1/24]
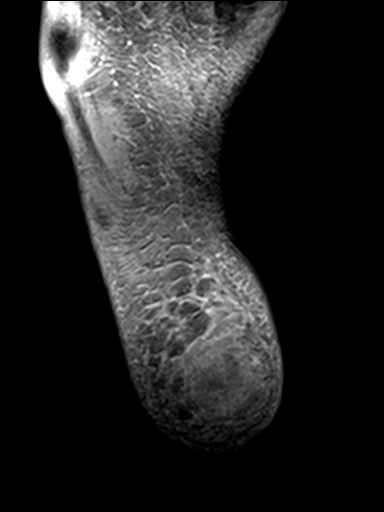
[im 4/24]
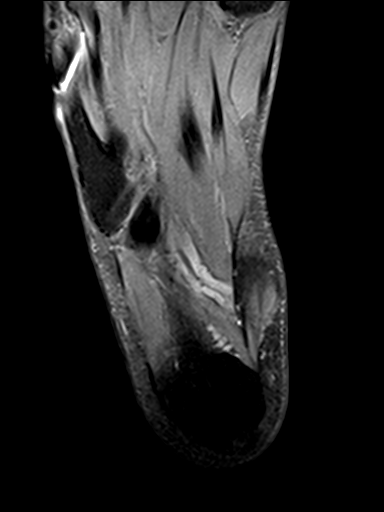
[im 7/24]
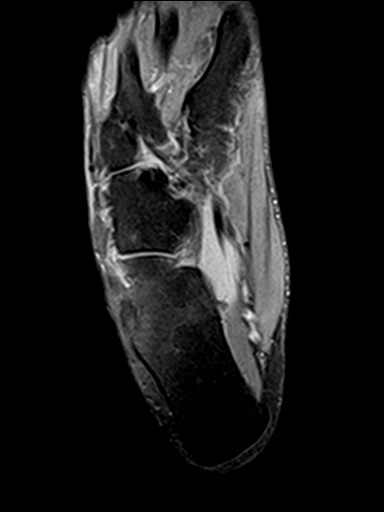
[im 10/24]
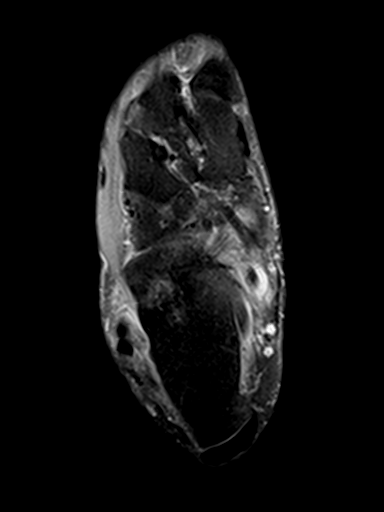
[im 14/24]
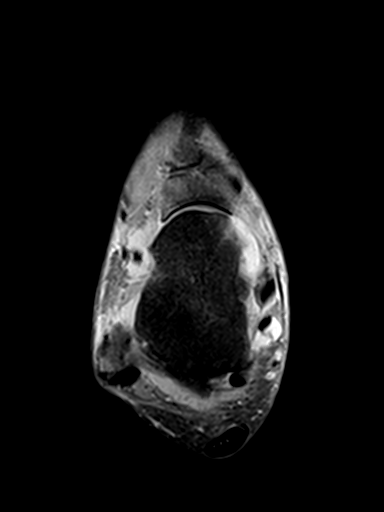
[im 17/24]
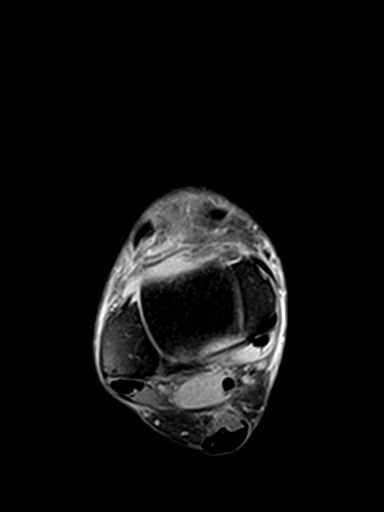
[im 20/24]
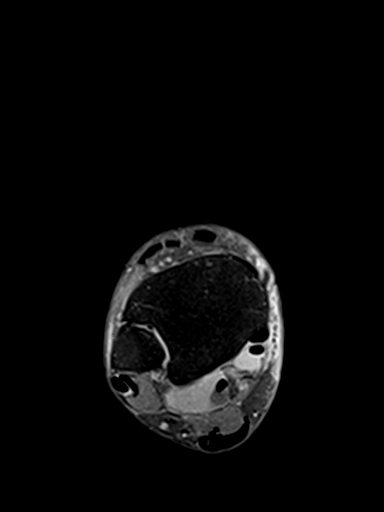
[im 24/24]
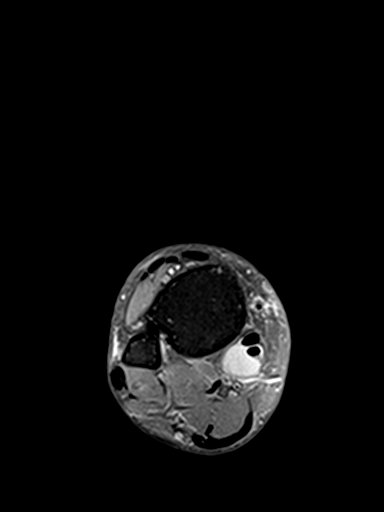

[Series 4: T2 fat-sat · axial · 3.5mm · 0.31mm/px · z∈[-56,+45]mm · 8 of 24 slices shown (1 of 3)]
[im 1/24]
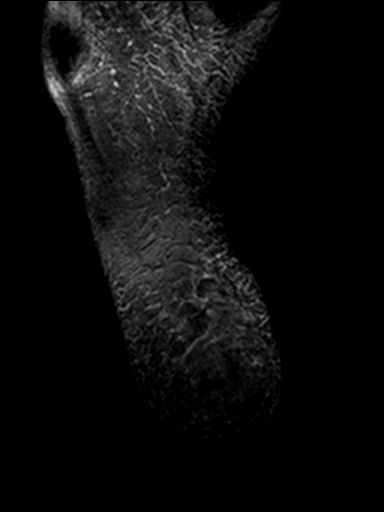
[im 4/24]
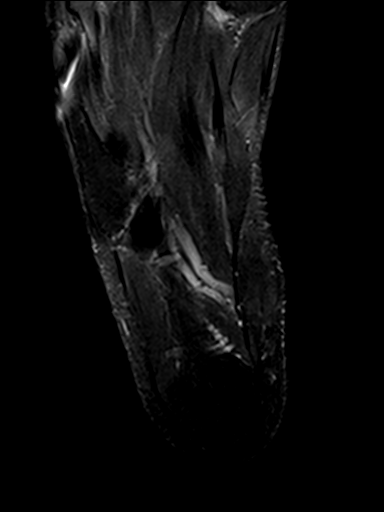
[im 7/24]
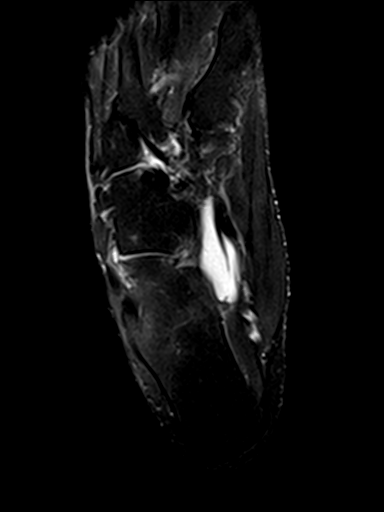
[im 10/24]
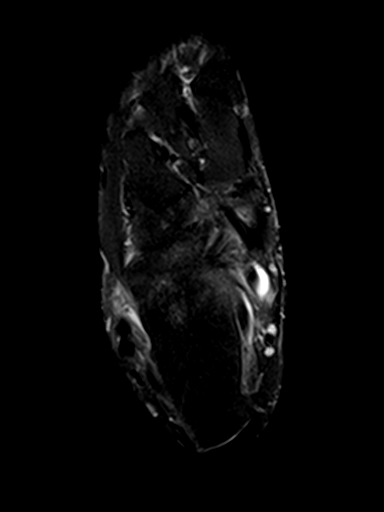
[im 14/24]
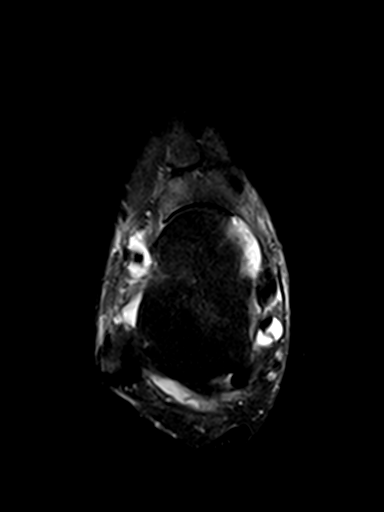
[im 17/24]
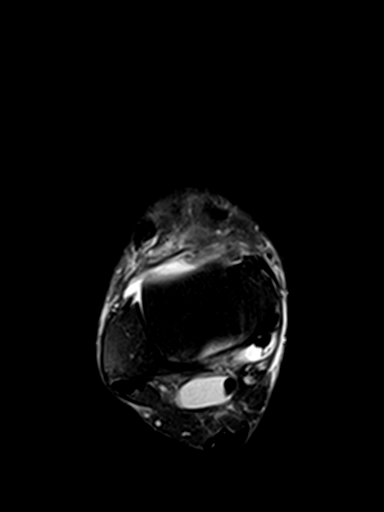
[im 20/24]
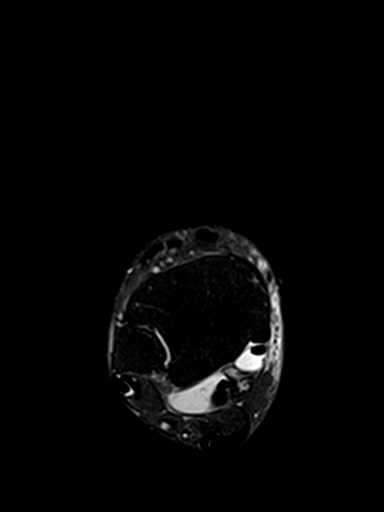
[im 24/24]
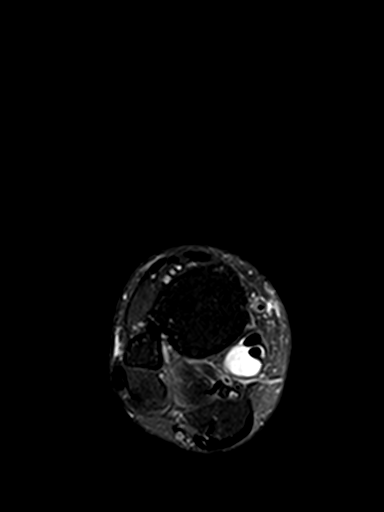

[Series 6: T2 fat-sat · sagittal · 3.0mm · 0.31mm/px · 4 of 22 slices shown (2 of 3)]
[im 1/22]
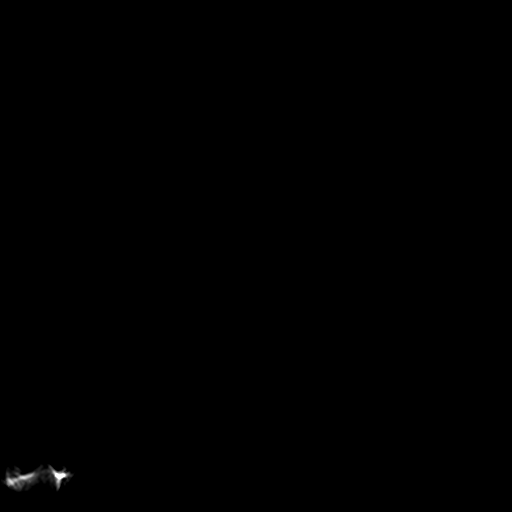
[im 4/22]
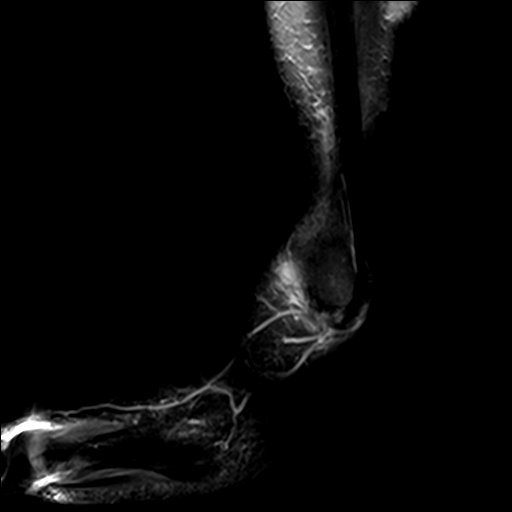
[im 11/22]
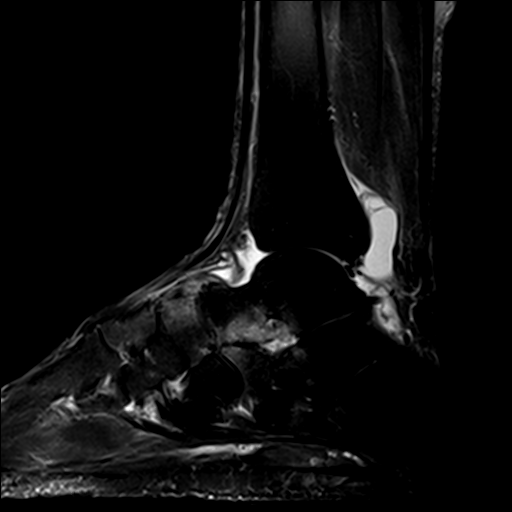
[im 18/22]
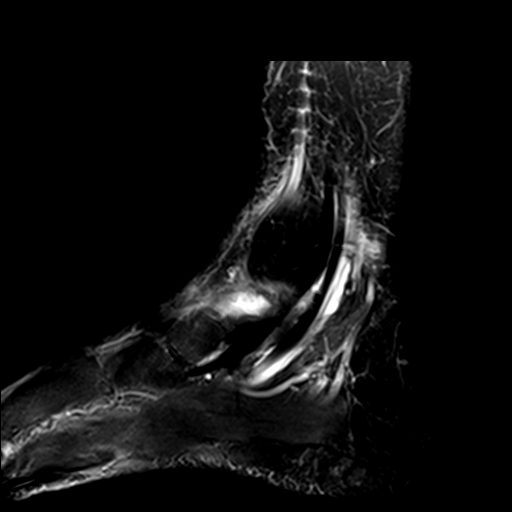

[Series 7: T2 fat-sat · coronal · 4.0mm · 0.31mm/px · 3 of 30 slices shown (3 of 3)]
[im 4/30]
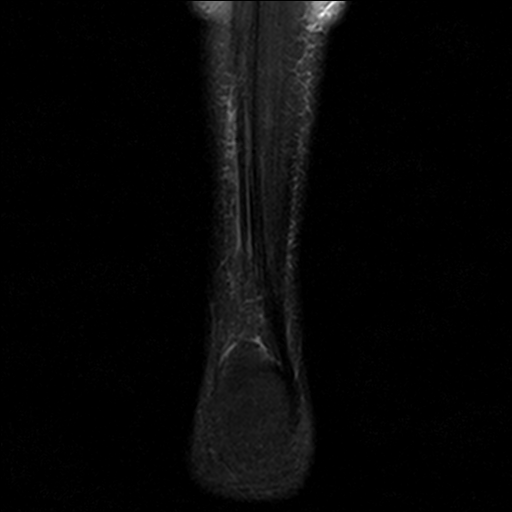
[im 17/30]
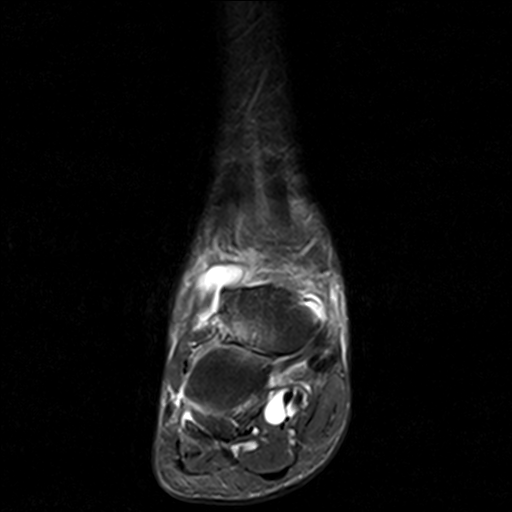
[im 26/30]
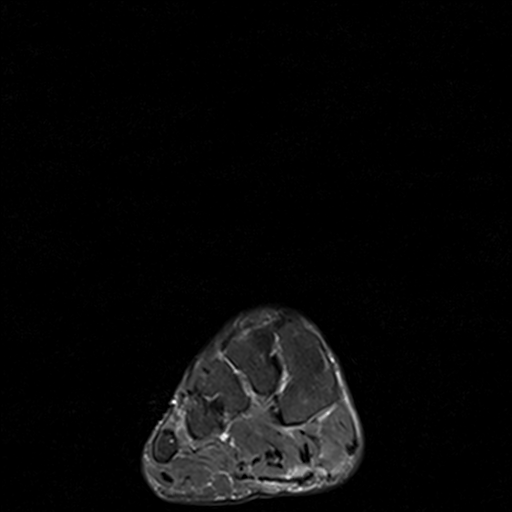

[23 of 40 positions shown; findings below may reference images not displayed]

FINDINGS: Tibiotalar degenerative changes and moderate-sized joint effusion.
No discrete full-thickness cartilage defect or osteochondral lesion.
There also more advanced subtalar degenerative changes with joint
space narrowing, joint effusions, spurring and stress edema. No
obvious erosions. Midfoot degenerative changes most notably at the
talonavicular joint. Scattered joint effusions and subchondral
cystic change. No obvious stress fracture.

Fairly extensive tenosynovitis involving the medial ankle tendons
and moderate fluid in the sinus tarsi E. the cervical and
interosseous ligaments are intact.

The medial and lateral ankle ligaments are intact. The anterior
talofibular ligament is moderately attenuated and may have been
previously torn. The medial and lateral ankle tendons are intact.
There is moderate Achilles tendinopathy. The plantar fascia is
intact.

Moderate artifact related to a fixating screw in the distal fifth
metatarsal.
IMPRESSION: Tibiotalar, subtalar and midfoot degenerative changes and joint
effusions. This in combination with fairly significant tenosynovitis
could suggest an inflammatory arthropathy such as rheumatoid
arthritis.

No stress fracture or osteochondral lesion.

Moderate Achilles tendinopathy.

The medial and lateral ankle ligaments and tendons are intact.

## 2018-03-19 ENCOUNTER — Other Ambulatory Visit: Payer: Self-pay | Admitting: Internal Medicine

## 2018-03-19 MED ORDER — EPINEPHRINE 0.3 MG/0.3ML IJ SOAJ: 0.3000 mg | Freq: Once | INTRAMUSCULAR | 1 refills | Status: AC

## 2018-03-19 NOTE — Telephone Encounter (Signed)
Copied from [place] [id number]. Topic: Quick Communication - Rx Refill/Question >> Mar 19, 2018  8:36 AM Oliver Pila B wrote: Medication: EPINEPHrine 0.3 mg/0.3 mL IJ SOAJ injection [[id number]]   Has the patient contacted their pharmacy? Yes.   (Agent: If no, request that the patient contact the pharmacy for the refill.) (Agent: If yes, when and what did the pharmacy advise?)  Preferred Pharmacy (with phone number or street name): [facility]  Agent: Please be advised that RX refills may take up to 3 business days. We ask that you follow-up with your pharmacy.

## 2018-03-19 NOTE — Telephone Encounter (Signed)
Epinephrine injection refill Last Refill: 04/25/15 # 2 Last OV: \12/15/17 PCP: Dr Silvio Pate Pharmacy:[facility] [address]

## 2018-04-04 ENCOUNTER — Encounter: Payer: Self-pay | Admitting: Internal Medicine

## 2018-04-04 ENCOUNTER — Ambulatory Visit (INDEPENDENT_AMBULATORY_CARE_PROVIDER_SITE_OTHER): Payer: Medicare Other | Admitting: Internal Medicine

## 2018-04-04 VITALS — BP 114/70 | HR 58 | Temp 98.0°F | Ht 60.25 in | Wt 119.0 lb

## 2018-04-04 DIAGNOSIS — L299 Pruritus, unspecified: Secondary | ICD-10-CM | POA: Insufficient documentation

## 2018-04-04 MED ORDER — METHYLPREDNISOLONE ACETATE 80 MG/ML IJ SUSP
80.0000 mg | Freq: Once | INTRAMUSCULAR | Status: AC
  Administered 2018-04-04: 80 mg via INTRAMUSCULAR

## 2018-04-04 MED ORDER — PREDNISONE 20 MG PO TABS: 40.0000 mg | ORAL_TABLET | Freq: Every day | ORAL | 0 refills | Status: DC

## 2018-04-04 NOTE — Patient Instructions (Signed)
Start the prednisone tomorrow. You can also add cetirizine 10mg  or fexofenadine 180mg  daily for itching. You can continue the benedryl at night.

## 2018-04-04 NOTE — Addendum Note (Signed)
Addended by: Pilar Grammes on: 04/04/2018 12:23 PM   Modules accepted: Orders

## 2018-04-04 NOTE — Assessment & Plan Note (Signed)
Looks like some type of insect bites but unclear what Distribution argues against ants---likely flying insect but she doesn't remember anything Pruritis is terrible Will give depomedrol and prednisone Try cetirzine or fexofenadine

## 2018-04-04 NOTE — Progress Notes (Signed)
Subjective:    Patient ID: Angela Bowen, female    DOB: Apr 21, 1941, 77 y.o.   MRN: [id number]  HPI Here due to rash  Has had terrible itching all over for 4 days Head and feet spared Has bites ---infected and oozing  Did walk in woods looking at house--- day before itching started Oral benedryl, anti itch cream--- not really helping much  Current Outpatient Medications on File Prior to Visit  Medication Sig Dispense Refill  . calcium carbonate (OS-CAL) 600 MG TABS Take 600 mg by mouth daily.    . cholecalciferol (VITAMIN D) 1000 UNITS tablet Take 1,000 Units by mouth daily.     Marland Kitchen lisinopril-hydrochlorothiazide (PRINZIDE,ZESTORETIC) 20-12.5 MG tablet Take 1 tablet by mouth daily. 90 tablet 3  . Multiple Vitamin (MULTIVITAMIN) tablet Take 1 tablet by mouth daily.      . Omega-3 Fatty Acids (FISH OIL PO) Take by mouth.    Marland Kitchen omeprazole (PRILOSEC) 20 MG capsule TAKE 1 CAPSULE TWICE A DAY BEFORE MEALS 180 capsule 3  . acetaminophen (TYLENOL) 500 MG tablet Take 500 mg by mouth every 6 (six) hours as needed (pain).    . [DISCONTINUED] Calcium-Magnesium (CALCIUM MAGNESIUM 750) 300-300 MG TABS Take by mouth daily.      No current facility-administered medications on file prior to visit.     Allergies  Allergen Reactions  . Nsaids Anaphylaxis  . Aspirin     High doses - intolerant  . Codeine     REACTION: rash    Past Medical History:  Diagnosis Date  . Cancer ([place])    stage II melanoma; right leg;face  . Diverticulosis of colon   . Gallstones   . GERD (gastroesophageal reflux disease)   . History of skin cancer   . Hypertension   . Normal stress echocardiogram 01/2006   echo 5/98, fairly normal  . Osteoarthritis   . Osteoporosis, unspecified   . S/P sclerotherapy of varicose veins 07/30/06   left leg, Dr. Eilleen Kempf    Past Surgical History:  Procedure Laterality Date  . CHOLECYSTECTOMY  2001  . FOOT SURGERY     right  . MELANOMA EXCISION  7/10   leg  . VARICOSE VEIN  SURGERY  1984   bilateral  . VARICOSE VEIN SURGERY  2004   right foot    Family History  Problem Relation Age of Onset  . Alcohol abuse Mother   . Colon cancer Mother   . Osteoporosis Mother   . Alcohol abuse Father   . Diabetes Father   . Lung cancer Brother   . Ovarian cancer Maternal Grandmother   . Breast cancer Neg Hx     Social History   Socioeconomic History  . Marital status: Married    Spouse name: Not on file  . Number of children: 2  . Years of education: Not on file  . Highest education level: Not on file  Occupational History  . Occupation: homemaker  Social Needs  . Financial resource strain: Not on file  . Food insecurity:    Worry: Not on file    Inability: Not on file  . Transportation needs:    Medical: Not on file    Non-medical: Not on file  Tobacco Use  . Smoking status: Never Smoker  . Smokeless tobacco: Never Used  Substance and Sexual Activity  . Alcohol use: No    Alcohol/week: 0.0 standard drinks    Comment: Wine, rarely  . Drug use: No  .  Sexual activity: Never  Lifestyle  . Physical activity:    Days per week: Not on file    Minutes per session: Not on file  . Stress: Not on file  Relationships  . Social connections:    Talks on phone: Not on file    Gets together: Not on file    Attends religious service: Not on file    Active member of club or organization: Not on file    Attends meetings of clubs or organizations: Not on file    Relationship status: Not on file  . Intimate partner violence:    Fear of current or ex partner: Not on file    Emotionally abused: Not on file    Physically abused: Not on file    Forced sexual activity: Not on file  Other Topics Concern  . Not on file  Social History Narrative   Has living will   No designated health care POA--requests husband. Alternate would be son Angela Bowen   Would accept resuscitation attempts   No tube feeds if cognitively unaware   Review of Systems  No  fever Didn't see any insects     Objective:   Physical Exam  Constitutional: No distress.  Skin:  Scattered red papules mostly on legs Some on upper back and arms           Assessment & Plan:

## 2018-04-11 ENCOUNTER — Telehealth: Payer: Self-pay | Admitting: Internal Medicine

## 2018-04-11 MED ORDER — EPINEPHRINE 0.3 MG/0.3ML IJ SOAJ: 0.3000 mg | INTRAMUSCULAR | 1 refills | Status: DC | PRN

## 2018-04-11 NOTE — Telephone Encounter (Signed)
Spoke to pt. She expressed understanding. Rx sent.

## 2018-04-11 NOTE — Telephone Encounter (Signed)
The epipen might have been overkill from her symptoms. I would definitely recommend starting with OTC antihistamine like benedryl &/or cetirizine. The epipen would be appropriate if worsening, wheezing or SOB, or lip/mouth symptoms  Okay to refill though #2 x 1

## 2018-04-11 NOTE — Telephone Encounter (Signed)
The epi-pen was not on her med list for some reason. I spoke to her and added it on. She said she had to use one last night because she was broken out all over and itching really bad. She was not having issues with breathing. I told her I was not sure if she needed to use it for the itching but I would clarify with Dr Silvio Pate.

## 2018-04-11 NOTE — Telephone Encounter (Signed)
Copied from [place] [id number]. Topic: Quick Communication - See Telephone Encounter >> Apr 11, 2018 10:12 AM Conception Chancy, NT wrote: CRM for notification. See Telephone encounter for: 04/11/18.  Patient is calling and needing a prescription for EpiPen. She states she had a reaction lastnight and needs more on hand. Please advise.  [id number] - [place], [place] - [place] [address] [place] [address] [place] [address] Phone: [phone] Fax: [phone]

## 2018-04-17 ENCOUNTER — Other Ambulatory Visit: Payer: Self-pay | Admitting: Internal Medicine

## 2018-04-17 DIAGNOSIS — Z1231 Encounter for screening mammogram for malignant neoplasm of breast: Secondary | ICD-10-CM

## 2018-04-18 DIAGNOSIS — D485 Neoplasm of uncertain behavior of skin: Secondary | ICD-10-CM | POA: Diagnosis not present

## 2018-04-18 DIAGNOSIS — S80869S Insect bite (nonvenomous), unspecified lower leg, sequela: Secondary | ICD-10-CM | POA: Diagnosis not present

## 2018-04-18 DIAGNOSIS — L814 Other melanin hyperpigmentation: Secondary | ICD-10-CM | POA: Diagnosis not present

## 2018-04-18 DIAGNOSIS — Z85820 Personal history of malignant melanoma of skin: Secondary | ICD-10-CM | POA: Diagnosis not present

## 2018-04-18 DIAGNOSIS — Z1283 Encounter for screening for malignant neoplasm of skin: Secondary | ICD-10-CM | POA: Diagnosis not present

## 2018-04-18 DIAGNOSIS — Z85828 Personal history of other malignant neoplasm of skin: Secondary | ICD-10-CM | POA: Diagnosis not present

## 2018-04-18 DIAGNOSIS — L570 Actinic keratosis: Secondary | ICD-10-CM | POA: Diagnosis not present

## 2018-04-18 DIAGNOSIS — D1800 Hemangioma unspecified site: Secondary | ICD-10-CM | POA: Diagnosis not present

## 2018-04-18 DIAGNOSIS — L821 Other seborrheic keratosis: Secondary | ICD-10-CM | POA: Diagnosis not present

## 2018-04-30 ENCOUNTER — Other Ambulatory Visit: Payer: Self-pay | Admitting: Internal Medicine

## 2018-04-30 DIAGNOSIS — I10 Essential (primary) hypertension: Secondary | ICD-10-CM

## 2018-04-30 NOTE — Telephone Encounter (Signed)
Pt need a prescription sent to [facility] for Lisinopril htz 10mg .

## 2018-04-30 NOTE — Telephone Encounter (Signed)
Patient did call TEAM health at 8am to discuss medication.   Team health communication not completed and forward to the office (see message string below).  Rx concerns addressed per this communication, team health communication closed.

## 2018-04-30 NOTE — Telephone Encounter (Signed)
Left message to call office. No DPR  Rx was sent to [facility] 12-15-17 #90 and 3 refills. Has she called them to see if they have that on file?

## 2018-05-02 ENCOUNTER — Ambulatory Visit
Admission: RE | Admit: 2018-05-02 | Discharge: 2018-05-02 | Disposition: A | Discharge status: Home / Self Care | Payer: Medicare Other | Source: Ambulatory Visit | Attending: Internal Medicine | Admitting: Internal Medicine

## 2018-05-02 DIAGNOSIS — Z1231 Encounter for screening mammogram for malignant neoplasm of breast: Secondary | ICD-10-CM

## 2018-05-09 ENCOUNTER — Telehealth: Payer: Self-pay | Admitting: Internal Medicine

## 2018-05-09 DIAGNOSIS — I10 Essential (primary) hypertension: Secondary | ICD-10-CM

## 2018-05-09 MED ORDER — LISINOPRIL-HYDROCHLOROTHIAZIDE 20-12.5 MG PO TABS: 1.0000 | ORAL_TABLET | Freq: Every day | ORAL | 0 refills | Status: DC

## 2018-05-09 MED ORDER — LISINOPRIL-HYDROCHLOROTHIAZIDE 20-12.5 MG PO TABS: 1.0000 | ORAL_TABLET | Freq: Every day | ORAL | 2 refills | Status: DC

## 2018-05-09 NOTE — Telephone Encounter (Addendum)
I have sent it to [facility] and a 30 day [facility].

## 2018-05-09 NOTE — Telephone Encounter (Signed)
Pt called office stating that [facility] told her that her Lisinopril was cancelled by Dr.Letvak on 02-02-18. Pt said she is not suppose to run out of refills until 02-02-19

## 2018-05-09 NOTE — Telephone Encounter (Signed)
I spoke to the pt and advised her that we sent a refill 12-15-17 #90/3. So if we cancelled a request in August, it was because we already sent in refills in June. She will reach out to [facility].

## 2018-05-09 NOTE — Addendum Note (Signed)
Addended by: Pilar Grammes on: 05/09/2018 08:58 AM   Modules accepted: Orders

## 2018-05-28 ENCOUNTER — Telehealth: Payer: Self-pay | Admitting: Internal Medicine

## 2018-05-28 MED ORDER — BENAZEPRIL-HYDROCHLOROTHIAZIDE 10-12.5 MG PO TABS: 1.0000 | ORAL_TABLET | Freq: Every day | ORAL | 3 refills | Status: DC

## 2018-05-28 NOTE — Telephone Encounter (Signed)
Okay to switch to benazepril/HCTZ 10/12.5 1 year Rx if they have that

## 2018-05-28 NOTE — Telephone Encounter (Signed)
Pt stated [facility] don't have Lisinopril in stock and she need a replacement. Please advise pt.

## 2018-05-28 NOTE — Telephone Encounter (Signed)
New rx sent to [facility]. Spoke to pt

## 2018-07-02 ENCOUNTER — Other Ambulatory Visit: Payer: Self-pay | Admitting: Internal Medicine

## 2018-12-25 ENCOUNTER — Ambulatory Visit (INDEPENDENT_AMBULATORY_CARE_PROVIDER_SITE_OTHER): Payer: Medicare Other | Admitting: Internal Medicine

## 2018-12-25 ENCOUNTER — Encounter: Payer: Self-pay | Admitting: Internal Medicine

## 2018-12-25 ENCOUNTER — Other Ambulatory Visit: Payer: Self-pay

## 2018-12-25 VITALS — BP 118/76 | HR 78 | Temp 98.1°F | Ht 60.5 in | Wt 118.0 lb

## 2018-12-25 DIAGNOSIS — M159 Polyosteoarthritis, unspecified: Secondary | ICD-10-CM | POA: Diagnosis not present

## 2018-12-25 DIAGNOSIS — K219 Gastro-esophageal reflux disease without esophagitis: Secondary | ICD-10-CM

## 2018-12-25 DIAGNOSIS — I10 Essential (primary) hypertension: Secondary | ICD-10-CM | POA: Diagnosis not present

## 2018-12-25 DIAGNOSIS — Z1211 Encounter for screening for malignant neoplasm of colon: Secondary | ICD-10-CM

## 2018-12-25 DIAGNOSIS — Z0000 Encounter for general adult medical examination without abnormal findings: Secondary | ICD-10-CM | POA: Diagnosis not present

## 2018-12-25 DIAGNOSIS — Z7189 Other specified counseling: Secondary | ICD-10-CM | POA: Diagnosis not present

## 2018-12-25 DIAGNOSIS — Z91018 Allergy to other foods: Secondary | ICD-10-CM | POA: Insufficient documentation

## 2018-12-25 LAB — COMPREHENSIVE METABOLIC PANEL
ALT: 18 U/L (ref 0–35)
AST: 17 U/L (ref 0–37)
Albumin: 4.4 g/dL (ref 3.5–5.2)
Alkaline Phosphatase: 64 U/L (ref 39–117)
BUN: 9 mg/dL (ref 6–23)
CO2: 30 mEq/L (ref 19–32)
Calcium: 9.3 mg/dL (ref 8.4–10.5)
Chloride: 102 mEq/L (ref 96–112)
Creatinine, Ser: 0.88 mg/dL (ref 0.40–1.20)
GFR: 62.1 mL/min (ref >60.00)
Glucose, Bld: 102 mg/dL — ABNORMAL HIGH (ref 70–99)
Potassium: 4.3 mEq/L (ref 3.5–5.1)
Sodium: 140 mEq/L (ref 135–145)
Total Bilirubin: 0.5 mg/dL (ref 0.2–1.2)
Total Protein: 6.7 g/dL (ref 6.0–8.3)

## 2018-12-25 LAB — CBC
HCT: 42.7 % (ref 36.0–46.0)
Hemoglobin: 14.5 g/dL (ref 12.0–15.0)
MCHC: 33.9 g/dL (ref 30.0–36.0)
MCV: 89.8 fl (ref 78.0–100.0)
Platelets: 269 10*3/uL (ref 150.0–400.0)
RBC: 4.76 Mil/uL (ref 3.87–5.11)
RDW: 12.5 % (ref 11.5–15.5)
WBC: 8.3 10*3/uL (ref 4.0–10.5)

## 2018-12-25 NOTE — Progress Notes (Signed)
Subjective:    Patient ID: Angela Bowen, female    DOB: 06-Nov-1940, 78 y.o.   MRN: [id number]  HPI Visit for Medicare wellness and follow up of chronic health conditions Reviewed form and advanced directives Reviewed other doctors--[facility], changing dentist, [facility] Not really exercising due to pain Vision fine Hearing is off somewhat--no major problems No hospitalizations or surgery Rare glass of wine.  No tobacco No falls No depression or anhedonia---but is bored Independent with instrumental ADLs Memory is fine  She has developed alpha gal syndrome again Had it years ago in [place] then it resolved Feels that it has come back since a tick bite Had to use her epipen 4-5 hours after a steak---severe rash, etc  Having more problems with arthritis in right ankle and heel Usually wears good tennis shoes This is limiting her now Right > left knee increasing pain. Especially bad on steps Has given up yard work Uses tylenol and aleve  She monitors BP---usually 140/80 No chest pain or SOB No dizziness or syncope No edema---other than some swelling from arthritis No palpitations  Continues on omeprazole Didn't tolerate reduced dose--- occasionally gets by with just one daily Still occasional reflux No dysphagia  Current Outpatient Medications on File Prior to Visit  Medication Sig Dispense Refill  . benazepril-hydrochlorthiazide (LOTENSIN HCT) 10-12.5 MG tablet Take 1 tablet by mouth daily. 90 tablet 3  . calcium carbonate (OS-CAL) 600 MG TABS Take 600 mg by mouth daily.    . cholecalciferol (VITAMIN D) 1000 UNITS tablet Take 1,000 Units by mouth daily.     Marland Kitchen EPINEPHrine 0.3 mg/0.3 mL IJ SOAJ injection Inject 0.3 mLs (0.3 mg total) into the skin as needed. 2 Device 1  . Multiple Vitamin (MULTIVITAMIN) tablet Take 1 tablet by mouth daily.      . Omega-3 Fatty Acids (FISH OIL PO) Take by mouth.    Marland Kitchen omeprazole (PRILOSEC) 20 MG capsule TAKE 1 CAPSULE  TWICE A DAY BEFORE MEALS 180 capsule 3  . [DISCONTINUED] Calcium-Magnesium (CALCIUM MAGNESIUM 750) 300-300 MG TABS Take by mouth daily.      No current facility-administered medications on file prior to visit.     Allergies  Allergen Reactions  . Nsaids Anaphylaxis  . Aspirin     High doses - intolerant  . Codeine     REACTION: rash    Past Medical History:  Diagnosis Date  . Cancer ([place])    stage II melanoma; right leg;face  . Diverticulosis of colon   . Gallstones   . GERD (gastroesophageal reflux disease)   . History of skin cancer   . Hypertension   . Normal stress echocardiogram 01/2006   echo 5/98, fairly normal  . Osteoarthritis   . Osteoporosis, unspecified   . S/P sclerotherapy of varicose veins 07/30/06   left leg, Dr. Eilleen Kempf    Past Surgical History:  Procedure Laterality Date  . CHOLECYSTECTOMY  2001  . FOOT SURGERY     right  . MELANOMA EXCISION  7/10   leg  . VARICOSE VEIN SURGERY  1984   bilateral  . VARICOSE VEIN SURGERY  2004   right foot    Family History  Problem Relation Age of Onset  . Alcohol abuse Mother   . Colon cancer Mother   . Osteoporosis Mother   . Alcohol abuse Father   . Diabetes Father   . Lung cancer Brother   . Ovarian cancer Maternal Grandmother   . Breast cancer Neg  Hx     Social History   Socioeconomic History  . Marital status: Married    Spouse name: Not on file  . Number of children: 2  . Years of education: Not on file  . Highest education level: Not on file  Occupational History  . Occupation: homemaker  Social Needs  . Financial resource strain: Not on file  . Food insecurity    Worry: Not on file    Inability: Not on file  . Transportation needs    Medical: Not on file    Non-medical: Not on file  Tobacco Use  . Smoking status: Never Smoker  . Smokeless tobacco: Never Used  Substance and Sexual Activity  . Alcohol use: No    Alcohol/week: 0.0 standard drinks    Comment: Wine, rarely  . Drug  use: No  . Sexual activity: Never  Lifestyle  . Physical activity    Days per week: Not on file    Minutes per session: Not on file  . Stress: Not on file  Relationships  . Social Herbalist on phone: Not on file    Gets together: Not on file    Attends religious service: Not on file    Active member of club or organization: Not on file    Attends meetings of clubs or organizations: Not on file    Relationship status: Not on file  . Intimate partner violence    Fear of current or ex partner: Not on file    Emotionally abused: Not on file    Physically abused: Not on file    Forced sexual activity: Not on file  Other Topics Concern  . Not on file  Social History Narrative   Has living will   No designated health care POA--requests husband. Alternate would be son [name]   Would accept resuscitation attempts   No tube feeds if cognitively unaware   Review of Systems Occasional headaches Appetite is fine Weight is stable Sleeps well mostly Wears seat belt Bowels are better---but occasionally has some loose stools. No blood No urinary problems other than frequency. No incontinence No suspicious skin areas---has derm visit for routine coming up     Objective:   Physical Exam  Constitutional: She is oriented to person, place, and time. She appears well-developed. No distress.  HENT:  Mouth/Throat: Oropharynx is clear and moist. No oropharyngeal exudate.  Neck: No thyromegaly present.  Cardiovascular: Normal rate, regular rhythm, normal heart sounds and intact distal pulses. Exam reveals no gallop.  No murmur heard. Respiratory: Effort normal and breath sounds normal. No respiratory distress. She has no wheezes. She has no rales.  GI: Soft. There is no abdominal tenderness.  Musculoskeletal:        General: No edema.     Comments: Tenderness at right ankle--trouble with passive ROM (pain)  Lymphadenopathy:    She has no cervical adenopathy.  Neurological:  She is alert and oriented to person, place, and time.  President--- "Dwaine Deter, [place]" [phone] D-l-r-o-w Recall 3/3  Skin: No rash noted. No erythema.  Psychiatric: She has a normal mood and affect. Her behavior is normal.           Assessment & Plan:

## 2018-12-25 NOTE — Assessment & Plan Note (Signed)
See social history 

## 2018-12-25 NOTE — Assessment & Plan Note (Signed)
Doesn't do well with PPI weaning

## 2018-12-25 NOTE — Progress Notes (Signed)
Angela Messenger T. Mckinley Adelstein, MD Primary Care and [place] at [facility] [place], [address] Phone: [phone]  FAX: [place] - 78 y.o. female  MRN [id number]  Date of Birth: Jul 14, 1940  Visit Date: 12/26/2018  PCP: Venia Carbon, MD  Referred by: Venia Carbon, MD  Chief Complaint  Patient presents with  . Foot Pain    Right   Subjective:   Angela Bowen is a 78 y.o. very pleasant female patient who presents with the following:  This is a pleasant elderly female, 78 years old who presents with right-sided ankle pain, courtesy of Dr. Silvio Pate.  She has multiple ongoing pain issues and complaints regarding her feet and ankles, some of which is been ongoing for multiple years.  She is seen several podiatrists in the past.  Dr. Stephenie Acres foot x-rays were independently reviewed by myself today.  These are from 2016, and the right foot series.  There is evidence of moderate to severe osteoarthritis throughout the entirety of the midfoot, as well as some degenerative change in the forefoot and toes. Electronically Signed  By: Owens Loffler, MD On: 12/26/2018  9:20 AM EDT   She has complaints in the metatarsal region throughout.  She also has pain in the midfoot throughout this is on both sides.  She has had decrease in her functional capacity over time.  Metatarsalgia Multiple other complaints.   Decrease in activity  F/u for orthotics MT bar, sport orthotics  Past Medical History, Surgical History, Social History, Family History, Problem List, Medications, and Allergies have been reviewed and updated if relevant.  Patient Active Problem List   Diagnosis Date Noted  . Allergy to alpha-gal 12/25/2018  . Advance directive discussed with patient 11/14/2014  . Family history of malignant neoplasm of gastrointestinal tract 08/23/2012  . Osteoporosis   . Routine general medical examination at a  health care facility 12/31/2010  . GERD 10/17/2008  . Essential hypertension, benign 02/27/2007  . DIVERTICULOSIS, COLON 02/27/2007  . Osteoarthritis, generalized 02/27/2007  . SKIN CANCER, HX OF 02/27/2007    Past Medical History:  Diagnosis Date  . Cancer ([place])    stage II melanoma; right leg;face  . Diverticulosis of colon   . Gallstones   . GERD (gastroesophageal reflux disease)   . History of skin cancer   . Hypertension   . Normal stress echocardiogram 01/2006   echo 5/98, fairly normal  . Osteoarthritis   . Osteoporosis, unspecified   . S/P sclerotherapy of varicose veins 07/30/06   left leg, Dr. Eilleen Kempf    Past Surgical History:  Procedure Laterality Date  . CHOLECYSTECTOMY  2001  . FOOT SURGERY     right  . MELANOMA EXCISION  7/10   leg  . [place]   bilateral  . VARICOSE VEIN SURGERY  2004   right foot    Social History   Socioeconomic History  . Marital status: Married    Spouse name: Not on file  . Number of children: 2  . Years of education: Not on file  . Highest education level: Not on file  Occupational History  . Occupation: homemaker  Social Needs  . Financial resource strain: Not on file  . Food insecurity    Worry: Not on file    Inability: Not on file  . Transportation needs    Medical: Not on file    Non-medical: Not on  file  Tobacco Use  . Smoking status: Never Smoker  . Smokeless tobacco: Never Used  Substance and Sexual Activity  . Alcohol use: No    Alcohol/week: 0.0 standard drinks    Comment: Wine, rarely  . Drug use: No  . Sexual activity: Never  Lifestyle  . Physical activity    Days per week: Not on file    Minutes per session: Not on file  . Stress: Not on file  Relationships  . Social Herbalist on phone: Not on file    Gets together: Not on file    Attends religious service: Not on file    Active member of club or organization: Not on file    Attends meetings of clubs or  organizations: Not on file    Relationship status: Not on file  . Intimate partner violence    Fear of current or ex partner: Not on file    Emotionally abused: Not on file    Physically abused: Not on file    Forced sexual activity: Not on file  Other Topics Concern  . Not on file  Social History Narrative   Has living will   No designated health care POA--requests husband. Alternate would be son [name]   Would accept resuscitation attempts   No tube feeds if cognitively unaware    Family History  Problem Relation Age of Onset  . Alcohol abuse Mother   . Colon cancer Mother   . Osteoporosis Mother   . Alcohol abuse Father   . Diabetes Father   . Lung cancer Brother   . Ovarian cancer Maternal Grandmother   . Breast cancer Neg Hx     Allergies  Allergen Reactions  . Nsaids Anaphylaxis  . Aspirin     High doses - intolerant  . Codeine     REACTION: rash    Medication list reviewed and updated in full in [place].  GEN: No fevers, chills. Nontoxic. Primarily MSK c/o today. MSK: Detailed in the HPI GI: tolerating PO intake without difficulty Neuro: No numbness, parasthesias, or tingling associated. Otherwise the pertinent positives of the ROS are noted above.   Objective:   BP 140/78   Pulse 64   Temp 99 F (37.2 C) (Oral)   Ht 5' 0.5" (1.537 m)   Wt 118 lb 8 oz (53.8 kg)   BMI 22.76 kg/m    GEN: WDWN, NAD, Non-toxic, Alert & Oriented x 3 HEENT: Atraumatic, Normocephalic.  Ears and Nose: No external deformity. EXTR: No clubbing/cyanosis/edema NEURO: Normal gait.  PSYCH: Normally interactive. Conversant. Not depressed or anxious appearing.  Calm demeanor.    Bilateral ankles are stable.  Negative anterior drawer and subtalar tilt testing.  Ankle strength is 5/5 throughout.  Nontender at the tibia and fibula.  The midfoot bilaterally is grossly stable and grossly nontender to direct bony palpation, but there is some pain with manipulation in  this region.  Posterior tib and peroneal tendons are nontender, as well as the Achilles.  Calcaneus is nontender.  Longitudinal arch is a notable cavus foot.  Transverse arch appears to be almost entirely gone.  Fat pads are gone in the metatarsal region as well as the calcaneus.  Bunionette formation bilaterally.  Toe splaying as well as remodeling in the forefoot with dropped metatarsal heads, and on the right, the first is overlapping the second, and in both feet the fifth is overlapping the fourth.  Assessment and Plan:  ICD-10-CM   1. Chronic foot pain, unspecified laterality  M79.673    G89.29   2. Metatarsalgia of both feet  M77.41    M77.42   3. Chronic ankle pain, bilateral  M25.571    G89.29    M25.572   4. Localized osteoarthrosis of ankle and foot, unspecified laterality  M19.079    >25 minutes spent in face to face time with patient, >50% spent in counselling or coordination of care   Reasonably complicated foot and ankle case in a patient with moderate to severe osteoarthritic changes throughout the foot and ankle.  She also has transverse arch collapse and pain in the metatarsal region.  The osteoarthritic changes will not go away.  She does have a cavus foot with transverse arch collapse, and I think which she would do better with some support in this region.  Unfortunately she did not bring any shoes that she typically wears with her, so I did my best making a metatarsal bar on some sports insoles.  I think she would do quite well some with some custom orthotics to support her cavus foot as well as metatarsal pain and transverse arch collapse.  Follow-up: Return in about 1 month (around 01/26/2019) for Orthotics, 40 minute appointment 8 AM or 2 PM appointment.  No orders of the defined types were placed in this encounter.  No orders of the defined types were placed in this encounter.   Signed,  Maud Deed. Charmain Diosdado, MD   Outpatient Encounter Medications as of  12/26/2018  Medication Sig  . benazepril-hydrochlorthiazide (LOTENSIN HCT) 10-12.5 MG tablet Take 1 tablet by mouth daily.  . calcium carbonate (OS-CAL) 600 MG TABS Take 600 mg by mouth daily.  . cholecalciferol (VITAMIN D) 1000 UNITS tablet Take 1,000 Units by mouth daily.   Marland Kitchen EPINEPHrine 0.3 mg/0.3 mL IJ SOAJ injection Inject 0.3 mLs (0.3 mg total) into the skin as needed.  . Multiple Vitamin (MULTIVITAMIN) tablet Take 1 tablet by mouth daily.    . Omega-3 Fatty Acids (FISH OIL PO) Take by mouth.  Marland Kitchen omeprazole (PRILOSEC) 20 MG capsule TAKE 1 CAPSULE TWICE A DAY BEFORE MEALS  . [DISCONTINUED] Calcium-Magnesium (CALCIUM MAGNESIUM 750) 300-300 MG TABS Take by mouth daily.    No facility-administered encounter medications on file as of 12/26/2018.

## 2018-12-25 NOTE — Assessment & Plan Note (Signed)
I have personally reviewed the Medicare Annual Wellness questionnaire and have noted  1. The patient's medical and social history  2. Their use of alcohol, tobacco or illicit drugs  3. Their current medications and supplements  4. The patient's functional ability including ADL's, fall risks, home safety risks and hearing or visual              impairment.  5. Diet and physical activities  6. Evidence for depression or mood disorders  The patients weight, height, BMI and visual acuity have been recorded in the chart  I have made referrals, counseling and provided education to the patient based review of the above and I have provided the pt with a written personalized care plan for preventive services.   I have provided you with a copy of your personalized plan for preventive services. Please take the time to review along with your updated medication list.  Discussed muscle strengthening Flu vaccine in the fall Mammogram due in 1-1.5 years Will do FIT again

## 2018-12-25 NOTE — Assessment & Plan Note (Signed)
Avoidance is needed Has epipen just in case

## 2018-12-25 NOTE — Assessment & Plan Note (Signed)
Biggest problem is right ankle--this may be ligament related also Uses tylenol/aleve Sports medicine evaluation

## 2018-12-25 NOTE — Assessment & Plan Note (Signed)
BP Readings from Last 3 Encounters:  12/25/18 118/76  04/04/18 114/70  12/15/17 136/80   Good control

## 2018-12-26 ENCOUNTER — Encounter: Payer: Self-pay | Admitting: Family Medicine

## 2018-12-26 ENCOUNTER — Ambulatory Visit (INDEPENDENT_AMBULATORY_CARE_PROVIDER_SITE_OTHER): Payer: Medicare Other | Admitting: Family Medicine

## 2018-12-26 VITALS — BP 140/78 | HR 64 | Temp 99.0°F | Ht 60.5 in | Wt 118.5 lb

## 2018-12-26 DIAGNOSIS — G8929 Other chronic pain: Secondary | ICD-10-CM

## 2018-12-26 DIAGNOSIS — M25571 Pain in right ankle and joints of right foot: Secondary | ICD-10-CM

## 2018-12-26 DIAGNOSIS — M7741 Metatarsalgia, right foot: Secondary | ICD-10-CM | POA: Diagnosis not present

## 2018-12-26 DIAGNOSIS — M7742 Metatarsalgia, left foot: Secondary | ICD-10-CM | POA: Diagnosis not present

## 2018-12-26 DIAGNOSIS — M19079 Primary osteoarthritis, unspecified ankle and foot: Secondary | ICD-10-CM | POA: Diagnosis not present

## 2018-12-26 DIAGNOSIS — M79673 Pain in unspecified foot: Secondary | ICD-10-CM | POA: Diagnosis not present

## 2018-12-26 DIAGNOSIS — M25572 Pain in left ankle and joints of left foot: Secondary | ICD-10-CM | POA: Diagnosis not present

## 2019-01-03 ENCOUNTER — Other Ambulatory Visit (INDEPENDENT_AMBULATORY_CARE_PROVIDER_SITE_OTHER): Payer: Medicare Other

## 2019-01-03 ENCOUNTER — Telehealth: Payer: Self-pay | Admitting: Radiology

## 2019-01-03 DIAGNOSIS — Z1211 Encounter for screening for malignant neoplasm of colon: Secondary | ICD-10-CM

## 2019-01-03 DIAGNOSIS — R195 Other fecal abnormalities: Secondary | ICD-10-CM

## 2019-01-03 LAB — FECAL OCCULT BLOOD, IMMUNOCHEMICAL: Fecal Occult Bld: POSITIVE — AB

## 2019-01-03 NOTE — Telephone Encounter (Signed)
Discussed with her Will go ahead with GI referral--she requests [facility] in [place]

## 2019-01-03 NOTE — Telephone Encounter (Signed)
[facility] called a positive ifob, results given to Dr Letvak 

## 2019-01-04 ENCOUNTER — Encounter: Payer: Self-pay | Admitting: Gastroenterology

## 2019-01-18 NOTE — Progress Notes (Signed)
Angela Ridings T. Dewayne Severe, Angela Bowen Primary Care and [place] at [facility] [place], [address] Phone: [phone]  FAX: [place] - 78 y.o. female  MRN [id number]  Date of Birth: 03-01-1941  Visit Date: 01/21/2019  PCP: Venia Carbon, Angela Bowen  Referred by: Venia Carbon, Angela Bowen  Chief Complaint  Patient presents with  . Foot Orthotics   Subjective:   Angela Bowen is a 78 y.o. very pleasant female patient who presents with the following:  She is a pleasant lady with multiple foot and ankle problems including extensive midfoot osteoarthritis.  She has seen multiple providers in the past including several podiatrists.   I reviewed her 2016 x-rays of her foot, which showed moderate midfoot OA.  Last time she was here in the office I gave her some sports orthotics with a metatarsal bar.  She is here today for follow-up.  She continues to have foot pain  12/26/2018 Last OV with Owens Loffler, Angela Bowen  This is a pleasant elderly female, 78 years old who presents with right-sided ankle pain, courtesy of Dr. Silvio Pate.  She has multiple ongoing pain issues and complaints regarding her feet and ankles, some of which is been ongoing for multiple years.  She is seen several podiatrists in the past.  Dr. Stephenie Acres foot x-rays were independently reviewed by myself today.  These are from 2016, and the right foot series.  There is evidence of moderate to Bowen osteoarthritis throughout the entirety of the midfoot, as well as some degenerative change in the forefoot and toes. Electronically Signed  By: Owens Loffler, Angela Bowen On: 12/26/2018  9:20 AM EDT   She has complaints in the metatarsal region throughout.  She also has pain in the midfoot throughout this is on both sides.  She has had decrease in her functional capacity over time.  Metatarsalgia Multiple other complaints.   Decrease in activity  F/u for orthotics   Past Medical History, Surgical History, Social History, Family History, Problem List, Medications, and Allergies have been reviewed and updated if relevant.  Patient Active Problem List   Diagnosis Date Noted  . Allergy to alpha-gal 12/25/2018  . Advance directive discussed with patient 11/14/2014  . Family history of malignant neoplasm of gastrointestinal tract 08/23/2012  . Osteoporosis   . Routine general medical examination at a health care facility 12/31/2010  . GERD 10/17/2008  . Essential hypertension, benign 02/27/2007  . DIVERTICULOSIS, COLON 02/27/2007  . Osteoarthritis, generalized 02/27/2007  . SKIN CANCER, HX OF 02/27/2007    Past Medical History:  Diagnosis Date  . Cancer ([place])    stage II melanoma; right leg;face  . Diverticulosis of colon   . Gallstones   . GERD (gastroesophageal reflux disease)   . History of skin cancer   . Hypertension   . Normal stress echocardiogram 01/2006   echo 5/98, fairly normal  . Osteoarthritis   . Osteoporosis, unspecified   . S/P sclerotherapy of varicose veins 07/30/06   left leg, Dr. Eilleen Kempf    Past Surgical History:  Procedure Laterality Date  . CHOLECYSTECTOMY  2001  . FOOT SURGERY     right  . MELANOMA EXCISION  7/10   leg  . [place]   bilateral  . VARICOSE VEIN SURGERY  2004   right foot    Social History   Socioeconomic History  . Marital status: Married    Spouse name: Not on  file  . Number of children: 2  . Years of education: Not on file  . Highest education level: Not on file  Occupational History  . Occupation: homemaker  Social Needs  . Financial resource strain: Not on file  . Food insecurity    Worry: Not on file    Inability: Not on file  . Transportation needs    Medical: Not on file    Non-medical: Not on file  Tobacco Use  . Smoking status: Never Smoker  . Smokeless tobacco: Never Used  Substance and Sexual Activity  . Alcohol use: No    Alcohol/week: 0.0 standard  drinks    Comment: Wine, rarely  . Drug use: No  . Sexual activity: Never  Lifestyle  . Physical activity    Days per week: Not on file    Minutes per session: Not on file  . Stress: Not on file  Relationships  . Social Herbalist on phone: Not on file    Gets together: Not on file    Attends religious service: Not on file    Active member of club or organization: Not on file    Attends meetings of clubs or organizations: Not on file    Relationship status: Not on file  . Intimate partner violence    Fear of current or ex partner: Not on file    Emotionally abused: Not on file    Physically abused: Not on file    Forced sexual activity: Not on file  Other Topics Concern  . Not on file  Social History Narrative   Has living will   No designated health care POA--requests husband. Alternate would be son [name]   Would accept resuscitation attempts   No tube feeds if cognitively unaware    Family History  Problem Relation Age of Onset  . Alcohol abuse Mother   . Colon cancer Mother   . Osteoporosis Mother   . Alcohol abuse Father   . Diabetes Father   . Lung cancer Brother   . Ovarian cancer Maternal Grandmother   . Breast cancer Neg Hx     Allergies  Allergen Reactions  . Nsaids Anaphylaxis  . Aspirin     High doses - intolerant  . Codeine     REACTION: rash    Medication list reviewed and updated in full in [place].  GEN: No fevers, chills. Nontoxic. Primarily MSK c/o today. MSK: Detailed in the HPI GI: tolerating PO intake without difficulty Neuro: No numbness, parasthesias, or tingling associated. Otherwise the pertinent positives of the ROS are noted above.   Objective:   BP 130/60   Pulse 70   Temp 98 F (36.7 C) (Oral)   Ht 5' 0.5" (1.537 m)   Wt 120 lb 8 oz (54.7 kg)   BMI 23.15 kg/m    GEN: WDWN, NAD, Non-toxic, Alert & Oriented x 3 HEENT: Atraumatic, Normocephalic.  Ears and Nose: No external deformity. EXTR: No  clubbing/cyanosis/edema NEURO: Normal gait.  PSYCH: Normally interactive. Conversant. Not depressed or anxious appearing.  Calm demeanor.   FEET: B Echymosis: no Edema: no ROM: full LE B Gait: heel toe, non-antalgic MT pain: y Callus pattern: none Lateral Mall: NT Medial Mall: NT Talus: NT Navicular: NT Cuboid: NT Calcaneous: NT Metatarsals: NT 5th MT: NT Phalanges: NT Achilles: NT Plantar Fascia: NT Fat Pad: minimal Peroneals: NT Post Tib: NT Great Toe: Nml motion Ant Drawer: neg ATFL: NT CFL: NT Deltoid:  NT Other foot breakdown: none Long arch: preserved - natural cavus  Transverse arch: completely collapsed Hindfoot breakdown: none Sensation: intact   Radiology: No results found.  Assessment and Plan:     ICD-10-CM   1. Chronic foot pain, unspecified laterality  M79.673    G89.29   2. Metatarsalgia of both feet  M77.41    M77.42   3. Localized osteoarthrosis of ankle and foot, unspecified laterality  M19.079   4. Chronic ankle pain, bilateral  M25.571    G89.29    M25.572    Excellent orthotics candidate  >40 minutes spent in face to face time with patient, >50% spent in counselling or coordination of care: gait analysis, foot analysis and manufacturing  Patient was fitted for a standard, cushioned, semi-rigid orthotic.  The orthotic was heated and the patient stood on the orthotic blank positioned on the orthotic stand. The patient was positioned in subtalar neutral position and 10 degrees of ankle dorsiflexion in a weight bearing stance. After molding, a stable Fast-Tech EVA base was applied to the orthotic blank.   The blank was ground to a stable position for weight bearing. Size: 6 base: Black F2 posting: none additional orthotic padding: none   Follow-up: No follow-ups on file.  No orders of the defined types were placed in this encounter.  No orders of the defined types were placed in this encounter.   Signed,  Maud Deed. Fey Coghill, Angela Bowen    Outpatient Encounter Medications as of 01/21/2019  Medication Sig  . benazepril-hydrochlorthiazide (LOTENSIN HCT) 10-12.5 MG tablet Take 1 tablet by mouth daily.  . calcium carbonate (OS-CAL) 600 MG TABS Take 600 mg by mouth daily.  . cholecalciferol (VITAMIN D) 1000 UNITS tablet Take 1,000 Units by mouth daily.   Marland Kitchen EPINEPHrine 0.3 mg/0.3 mL IJ SOAJ injection Inject 0.3 mLs (0.3 mg total) into the skin as needed.  . Multiple Vitamin (MULTIVITAMIN) tablet Take 1 tablet by mouth daily.    . Omega-3 Fatty Acids (FISH OIL PO) Take by mouth.  Marland Kitchen omeprazole (PRILOSEC) 20 MG capsule TAKE 1 CAPSULE TWICE A DAY BEFORE MEALS  . [DISCONTINUED] Calcium-Magnesium (CALCIUM MAGNESIUM 750) 300-300 MG TABS Take by mouth daily.    No facility-administered encounter medications on file as of 01/21/2019.

## 2019-01-21 ENCOUNTER — Encounter: Payer: Self-pay | Admitting: Family Medicine

## 2019-01-21 ENCOUNTER — Ambulatory Visit (INDEPENDENT_AMBULATORY_CARE_PROVIDER_SITE_OTHER): Payer: Medicare Other | Admitting: Family Medicine

## 2019-01-21 ENCOUNTER — Other Ambulatory Visit: Payer: Self-pay

## 2019-01-21 VITALS — BP 130/60 | HR 70 | Temp 98.0°F | Ht 60.5 in | Wt 120.5 lb

## 2019-01-21 DIAGNOSIS — M7741 Metatarsalgia, right foot: Secondary | ICD-10-CM

## 2019-01-21 DIAGNOSIS — M25572 Pain in left ankle and joints of left foot: Secondary | ICD-10-CM

## 2019-01-21 DIAGNOSIS — M25571 Pain in right ankle and joints of right foot: Secondary | ICD-10-CM

## 2019-01-21 DIAGNOSIS — M19079 Primary osteoarthritis, unspecified ankle and foot: Secondary | ICD-10-CM | POA: Diagnosis not present

## 2019-01-21 DIAGNOSIS — M7742 Metatarsalgia, left foot: Secondary | ICD-10-CM | POA: Diagnosis not present

## 2019-01-21 DIAGNOSIS — G8929 Other chronic pain: Secondary | ICD-10-CM

## 2019-01-21 DIAGNOSIS — M79673 Pain in unspecified foot: Secondary | ICD-10-CM | POA: Diagnosis not present

## 2019-02-06 ENCOUNTER — Ambulatory Visit: Payer: Medicare Other

## 2019-02-06 ENCOUNTER — Other Ambulatory Visit: Payer: Self-pay

## 2019-02-06 VITALS — Ht 60.75 in | Wt 118.0 lb

## 2019-02-06 DIAGNOSIS — R195 Other fecal abnormalities: Secondary | ICD-10-CM

## 2019-02-06 DIAGNOSIS — Z800 Family history of malignant neoplasm of digestive organs: Secondary | ICD-10-CM

## 2019-02-06 MED ORDER — SUPREP BOWEL PREP KIT 17.5-3.13-1.6 GM/177ML PO SOLN: 1.0000 | Freq: Once | ORAL | 0 refills | Status: AC

## 2019-02-06 NOTE — Progress Notes (Signed)
Per pt, no allergies to soy or egg products.Pt not taking any weight loss meds or using  O2 at home.  Pt denies sedation problems. Pt refused emmi video.  The PV was done over the phone due to COVID-19. Verified pt's address and insurance. Reviewed pt's medical hx and prep instructions and will mail  instructions to pt today. Informed pt to call with any changes or questions prior to her procedure. Pt understood

## 2019-02-12 ENCOUNTER — Telehealth: Payer: Self-pay | Admitting: Internal Medicine

## 2019-02-12 NOTE — Telephone Encounter (Signed)
Angela Bowen DOB 2040-12-12 MRN# [id number] Best number [id number]  Pt called stating she was here to see dr copland on 12/26/2018 regarding her foot.  When she received the eob from insurance it states it from [facility].  Pt would like a call explaining why the bill was from cardiology.  Sent email to dawn herrington and erin

## 2019-02-13 NOTE — Telephone Encounter (Signed)
Patient called back to check status of request.

## 2019-02-15 ENCOUNTER — Encounter: Payer: Self-pay | Admitting: Gastroenterology

## 2019-02-19 ENCOUNTER — Telehealth: Payer: Self-pay | Admitting: Gastroenterology

## 2019-02-19 ENCOUNTER — Telehealth: Payer: Self-pay | Admitting: Internal Medicine

## 2019-02-19 NOTE — Telephone Encounter (Signed)

## 2019-02-19 NOTE — Telephone Encounter (Signed)
Left message with nickole asking her to call pt to help with this

## 2019-02-19 NOTE — Telephone Encounter (Signed)
Opened in error

## 2019-02-20 ENCOUNTER — Other Ambulatory Visit: Payer: Self-pay

## 2019-02-20 ENCOUNTER — Ambulatory Visit (AMBULATORY_SURGERY_CENTER): Payer: Medicare Other | Admitting: Gastroenterology

## 2019-02-20 ENCOUNTER — Encounter: Payer: Self-pay | Admitting: Gastroenterology

## 2019-02-20 VITALS — BP 116/53 | HR 60 | Temp 97.8°F | Resp 14 | Ht 60.0 in | Wt 118.0 lb

## 2019-02-20 DIAGNOSIS — Z86010 Personal history of colonic polyps: Secondary | ICD-10-CM | POA: Diagnosis not present

## 2019-02-20 DIAGNOSIS — R195 Other fecal abnormalities: Secondary | ICD-10-CM

## 2019-02-20 DIAGNOSIS — Z800 Family history of malignant neoplasm of digestive organs: Secondary | ICD-10-CM | POA: Diagnosis not present

## 2019-02-20 DIAGNOSIS — D125 Benign neoplasm of sigmoid colon: Secondary | ICD-10-CM | POA: Diagnosis not present

## 2019-02-20 DIAGNOSIS — I10 Essential (primary) hypertension: Secondary | ICD-10-CM | POA: Diagnosis not present

## 2019-02-20 DIAGNOSIS — D122 Benign neoplasm of ascending colon: Secondary | ICD-10-CM | POA: Diagnosis not present

## 2019-02-20 MED ORDER — SODIUM CHLORIDE 0.9 % IV SOLN: 500.0000 mL | Freq: Once | INTRAVENOUS | Status: DC

## 2019-02-20 NOTE — Progress Notes (Signed)
Report given to PACU, vss 

## 2019-02-20 NOTE — Patient Instructions (Signed)
YOU HAD AN ENDOSCOPIC PROCEDURE TODAY AT THE [facility]:   Refer to the procedure report that was given to you for any specific questions about what was found during the examination.  If the procedure report does not answer your questions, please call your gastroenterologist to clarify.  If you requested that your care partner not be given the details of your procedure findings, then the procedure report has been included in a sealed envelope for you to review at your convenience later.  YOU SHOULD EXPECT: Some feelings of bloating in the abdomen. Passage of more gas than usual.  Walking can help get rid of the air that was put into your GI tract during the procedure and reduce the bloating. If you had a lower endoscopy (such as a colonoscopy or flexible sigmoidoscopy) you may notice spotting of blood in your stool or on the toilet paper. If you underwent a bowel prep for your procedure, you may not have a normal bowel movement for a few days.  Please Note:  You might notice some irritation and congestion in your nose or some drainage.  This is from the oxygen used during your procedure.  There is no need for concern and it should clear up in a day or so.  SYMPTOMS TO REPORT IMMEDIATELY:   Following lower endoscopy (colonoscopy or flexible sigmoidoscopy):  Excessive amounts of blood in the stool  Significant tenderness or worsening of abdominal pains  Swelling of the abdomen that is new, acute  Fever of 100F or higher   For urgent or emergent issues, a gastroenterologist can be reached at any hour by calling [phone].   DIET:  We do recommend a small meal at first, but then you may proceed to your regular diet.  Drink plenty of fluids but you should avoid alcoholic beverages for 24 hours.  MEDICATIONS: Continue present medications.  Please see handouts given to you by your recovery nurse.  ACTIVITY:  You should plan to take it easy for the rest of today and you should  NOT DRIVE or use heavy machinery until tomorrow (because of the sedation medicines used during the test).    FOLLOW UP: Our staff will call the number listed on your records 48-72 hours following your procedure to check on you and address any questions or concerns that you may have regarding the information given to you following your procedure. If we do not reach you, we will leave a message.  We will attempt to reach you two times.  During this call, we will ask if you have developed any symptoms of COVID 19. If you develop any symptoms (ie: fever, flu-like symptoms, shortness of breath, cough etc.) before then, please call [phone].  If you test positive for Covid 19 in the 2 weeks post procedure, please call and report this information to us.    If any biopsies were taken you will be contacted by phone or by letter within the next 1-3 weeks.  Please call us at [phone] if you have not heard about the biopsies in 3 weeks.   Thank you for allowing us to provide for your healthcare needs today.   SIGNATURES/CONFIDENTIALITY: You and/or your care partner have signed paperwork which will be entered into your electronic medical record.  These signatures attest to the fact that that the information above on your After Visit Summary has been reviewed and is understood.  Full responsibility of the confidentiality of this discharge information lies with you and/or   your care-partner. 

## 2019-02-20 NOTE — Op Note (Signed)
[facility] Patient Name: Angela Bowen Procedure Date: 02/20/2019 7:30 AM MRN: [id number] Endoscopist: Remo Lipps P. Havery Moros , MD Age: 78 Referring MD:  Date of Birth: 1941/03/09 Gender: Female Account #: [id number] Procedure:                Colonoscopy Indications:              Heme positive stool, mother with colon cancer                            diagnosed age 65s Medicines:                Monitored Anesthesia Care Procedure:                Pre-Anesthesia Assessment:                           - Prior to the procedure, a History and Physical                            was performed, and patient medications and                            allergies were reviewed. The patient's tolerance of                            previous anesthesia was also reviewed. The risks                            and benefits of the procedure and the sedation                            options and risks were discussed with the patient.                            All questions were answered, and informed consent                            was obtained. Prior Anticoagulants: The patient has                            taken no previous anticoagulant or antiplatelet                            agents. ASA Grade Assessment: II - A patient with                            mild systemic disease. After reviewing the risks                            and benefits, the patient was deemed in                            satisfactory condition to undergo the procedure.  After obtaining informed consent, the colonoscope                            was passed under direct vision. Throughout the                            procedure, the patient's blood pressure, pulse, and                            oxygen saturations were monitored continuously. The                            Colonoscope was introduced through the anus and                            advanced to the the terminal ileum, with                             identification of the appendiceal orifice and IC                            valve. The colonoscopy was performed without                            difficulty. The patient tolerated the procedure                            well. The quality of the bowel preparation was                            good. The terminal ileum, ileocecal valve,                            appendiceal orifice, and rectum were photographed. Scope In: 7:36:32 AM Scope Out: 7:59:28 AM Scope Withdrawal Time: 0 hours 17 minutes 2 seconds  Total Procedure Duration: 0 hours 22 minutes 56 seconds  Findings:                 The perianal and digital rectal examinations were                            normal.                           The terminal ileum appeared normal.                           Three sessile polyps were found in the ascending                            colon. The polyps were 3 mm in size. These polyps                            were removed with a cold snare. Resection and  retrieval were complete.                           A 3 mm polyp was found in the sigmoid colon. The                            polyp was sessile. The polyp was removed with a                            cold snare. Resection and retrieval were complete.                           Many medium-mouthed diverticula were found in the                            left colon, severe in the sigmoid colon.                           Internal hemorrhoids were found during                            retroflexion. The hemorrhoids were small.                           The exam was otherwise without abnormality. Complications:            No immediate complications. Estimated blood loss:                            Minimal. Estimated Blood Loss:     Estimated blood loss was minimal. Impression:               - The examined portion of the ileum was normal.                           - Three 3 mm polyps in  the ascending colon, removed                            with a cold snare. Resected and retrieved.                           - One 3 mm polyp in the sigmoid colon, removed with                            a cold snare. Resected and retrieved.                           - Diverticulosis in the left colon.                           - Internal hemorrhoids.                           - The examination was otherwise normal.  Would not recommend any further stool based testing                            for screening purposes for this patient with a                            family history of colon cancer, history of polyps,                            and age. Recommendation:           - Patient has a contact number available for                            emergencies. The signs and symptoms of potential                            delayed complications were discussed with the                            patient. Return to normal activities tomorrow.                            Written discharge instructions were provided to the                            patient.                           - Resume previous diet.                           - Continue present medications.                           - Await pathology results. Remo Lipps P. Havery Moros, MD 02/20/2019 8:04:45 AM This report has been signed electronically.

## 2019-02-20 NOTE — Progress Notes (Signed)
Pt's states no medical or surgical changes since previsit or office visit. 

## 2019-02-20 NOTE — Progress Notes (Signed)
Called to room to assist during endoscopic procedure.  Patient ID and intended procedure confirmed with present staff. Received instructions for my participation in the procedure from the performing physician.  

## 2019-02-20 NOTE — Progress Notes (Signed)
[name]

## 2019-02-22 ENCOUNTER — Telehealth: Payer: Self-pay

## 2019-02-22 NOTE — Telephone Encounter (Signed)
  Follow up Call-  Call back number 02/20/2019  Post procedure Call Back phone  # [phone]  Permission to leave phone message Yes  comments talk over message per pt  Some recent data might be hidden     Patient questions:  Do you have a fever, pain , or abdominal swelling? No. Pain Score  0 *  Have you tolerated food without any problems? Yes.    Have you been able to return to your normal activities? Yes.    Do you have any questions about your discharge instructions: Diet   No. Medications  No. Follow up visit  No.  Do you have questions or concerns about your Care? No.  Actions: * If pain score is 4 or above: No action needed, pain <4.  1. Have you developed a fever since your procedure? no  2.   Have you had an respiratory symptoms (SOB or cough) since your procedure? no  3.   Have you tested positive for COVID 19 since your procedure no  4.   Have you had any family members/close contacts diagnosed with the COVID 19 since your procedure?  no   If yes to any of these questions please route to Joylene John, RN and Alphonsa Gin, Therapist, sports.

## 2019-02-22 NOTE — Telephone Encounter (Signed)
NO ANSWER, MESSAGE LEFT FOR PATIENT. 

## 2019-03-13 ENCOUNTER — Telehealth: Payer: Self-pay | Admitting: Internal Medicine

## 2019-03-13 NOTE — Telephone Encounter (Signed)
Spoke to pt. She has an appt with Dr Remus Blake in a few weeks.

## 2019-03-13 NOTE — Telephone Encounter (Signed)
Patient is calling to find out if you have a recommendation for a good allergy doctor within [facility] that she could be seen at.  She stated she preferred the [place] area if possible.   She is wanting to have an antibody test done for alpha gal syndrome

## 2019-03-13 NOTE — Telephone Encounter (Signed)
I would recommend  allergy in [place] (not actually affiliated with [place] though) Dr Oris Drone, et al  Does she want to get the blood test there? Probably a good idea as they may want to check other things as well

## 2019-03-21 DIAGNOSIS — Z91018 Allergy to other foods: Secondary | ICD-10-CM | POA: Diagnosis not present

## 2019-03-21 DIAGNOSIS — R21 Rash and other nonspecific skin eruption: Secondary | ICD-10-CM | POA: Diagnosis not present

## 2019-03-22 ENCOUNTER — Other Ambulatory Visit: Payer: Self-pay | Admitting: Internal Medicine

## 2019-03-22 DIAGNOSIS — Z1231 Encounter for screening mammogram for malignant neoplasm of breast: Secondary | ICD-10-CM

## 2019-04-24 DIAGNOSIS — D229 Melanocytic nevi, unspecified: Secondary | ICD-10-CM | POA: Diagnosis not present

## 2019-04-24 DIAGNOSIS — L814 Other melanin hyperpigmentation: Secondary | ICD-10-CM | POA: Diagnosis not present

## 2019-04-24 DIAGNOSIS — D485 Neoplasm of uncertain behavior of skin: Secondary | ICD-10-CM | POA: Diagnosis not present

## 2019-04-24 DIAGNOSIS — Z1283 Encounter for screening for malignant neoplasm of skin: Secondary | ICD-10-CM | POA: Diagnosis not present

## 2019-04-24 DIAGNOSIS — D1800 Hemangioma unspecified site: Secondary | ICD-10-CM | POA: Diagnosis not present

## 2019-04-24 DIAGNOSIS — D2339 Other benign neoplasm of skin of other parts of face: Secondary | ICD-10-CM | POA: Diagnosis not present

## 2019-04-24 DIAGNOSIS — L820 Inflamed seborrheic keratosis: Secondary | ICD-10-CM | POA: Diagnosis not present

## 2019-05-06 ENCOUNTER — Other Ambulatory Visit: Payer: Self-pay | Admitting: Internal Medicine

## 2019-05-09 ENCOUNTER — Other Ambulatory Visit: Payer: Self-pay

## 2019-05-09 ENCOUNTER — Ambulatory Visit
Admission: RE | Admit: 2019-05-09 | Discharge: 2019-05-09 | Disposition: A | Discharge status: Home / Self Care | Payer: Medicare Other | Source: Ambulatory Visit | Attending: Internal Medicine | Admitting: Internal Medicine

## 2019-05-09 DIAGNOSIS — Z1231 Encounter for screening mammogram for malignant neoplasm of breast: Secondary | ICD-10-CM

## 2019-05-10 DIAGNOSIS — M19032 Primary osteoarthritis, left wrist: Secondary | ICD-10-CM | POA: Diagnosis not present

## 2019-05-10 DIAGNOSIS — M654 Radial styloid tenosynovitis [de Quervain]: Secondary | ICD-10-CM | POA: Diagnosis not present

## 2019-07-08 ENCOUNTER — Other Ambulatory Visit: Payer: Self-pay | Admitting: Internal Medicine

## 2019-07-30 ENCOUNTER — Other Ambulatory Visit: Payer: Self-pay

## 2019-07-30 ENCOUNTER — Ambulatory Visit (INDEPENDENT_AMBULATORY_CARE_PROVIDER_SITE_OTHER): Payer: Medicare Other | Admitting: Internal Medicine

## 2019-07-30 ENCOUNTER — Encounter: Payer: Self-pay | Admitting: Internal Medicine

## 2019-07-30 DIAGNOSIS — M1990 Unspecified osteoarthritis, unspecified site: Secondary | ICD-10-CM | POA: Insufficient documentation

## 2019-07-30 DIAGNOSIS — M159 Polyosteoarthritis, unspecified: Secondary | ICD-10-CM

## 2019-07-30 DIAGNOSIS — K219 Gastro-esophageal reflux disease without esophagitis: Secondary | ICD-10-CM | POA: Diagnosis not present

## 2019-07-30 MED ORDER — OMEPRAZOLE 20 MG PO CPDR: 20.0000 mg | DELAYED_RELEASE_CAPSULE | Freq: Two times a day (BID) | ORAL | 3 refills | Status: DC

## 2019-07-30 NOTE — Progress Notes (Signed)
Subjective:    Patient ID: Angela Bowen, female    DOB: 09/22/1940, 79 y.o.   MRN: [id number]  HPI Here due to hand pain and right foot pain Mostly she needs a refill on omeprazole and was told she needed to be seen  Having more trouble with the right foot---almost fell down the stairs the other day Ongoing bone deterioration Excruciating pain if she twists her foot Ortho told her she needed podiatrist At first had heel pain--but now the forefoot  Seen by PA at [facility] for [facility] pain bilaterally No control--will drop things when picking them up Has tried advil--helps a little (200mg  as single dose--not every day) No topical Rx  Current Outpatient Medications on File Prior to Visit  Medication Sig Dispense Refill  . benazepril-hydrochlorthiazide (LOTENSIN HCT) 10-12.5 MG tablet TAKE 1 TABLET DAILY 90 tablet 3  . calcium carbonate (OS-CAL) 600 MG TABS Take 600 mg by mouth daily.    . cholecalciferol (VITAMIN D) 1000 UNITS tablet Take 1,000 Units by mouth daily.     Marland Kitchen EPINEPHrine 0.3 mg/0.3 mL IJ SOAJ injection Inject 0.3 mLs (0.3 mg total) into the skin as needed. 2 Device 1  . Multiple Vitamin (MULTIVITAMIN) tablet Take 1 tablet by mouth daily.      . Omega-3 Fatty Acids (FISH OIL PO) Take by mouth.    Marland Kitchen omeprazole (PRILOSEC) 20 MG capsule TAKE 1 CAPSULE TWICE A DAY BEFORE MEALS 180 capsule 3  . [DISCONTINUED] Calcium-Magnesium (CALCIUM MAGNESIUM 750) 300-300 MG TABS Take by mouth daily.      Current Facility-Administered Medications on File Prior to Visit  Medication Dose Route Frequency Provider Last Rate Last Admin  . 0.9 %  sodium chloride infusion  500 mL Intravenous Once Armbruster, Carlota Raspberry, MD        Allergies  Allergen Reactions  . Meat [Alpha-Gal]     RED MEAT-CAUSES SEVERE HIVES,SOB  . Nsaids Anaphylaxis  . Aspirin     High doses - intolerant/causes hives  . Codeine     REACTION: rash    Past Medical History:  Diagnosis Date  . Cancer ([place])    stage II  melanoma; right leg;face  . Diverticulosis of colon   . Gallstones   . GERD (gastroesophageal reflux disease)   . History of skin cancer   . Hypertension   . Normal stress echocardiogram 01/2006   echo 5/98, fairly normal  . Osteoarthritis   . Osteoporosis, unspecified   . S/P sclerotherapy of varicose veins 07/30/06   left leg, Dr. Eilleen Kempf  . Skin cancer    STAGE II ON RIGHT LEG    Past Surgical History:  Procedure Laterality Date  . CHOLECYSTECTOMY  2001  . FOOT SURGERY     right  . MELANOMA EXCISION  7/10   leg  . MOHS SURGERY    . VARICOSE VEIN SURGERY  1984   bilateral  . VARICOSE VEIN SURGERY  2004   right foot    Family History  Problem Relation Age of Onset  . Alcohol abuse Mother   . Colon cancer Mother   . Osteoporosis Mother   . Alcohol abuse Father   . Diabetes Father   . Lung cancer Brother   . Ovarian cancer Maternal Grandmother   . Breast cancer Neg Hx     Social History   Socioeconomic History  . Marital status: Married    Spouse name: Not on file  . Number of children: 2  . Years  of education: Not on file  . Highest education level: Not on file  Occupational History  . Occupation: homemaker  Tobacco Use  . Smoking status: Never Smoker  . Smokeless tobacco: Never Used  Substance and Sexual Activity  . Alcohol use: Yes    Alcohol/week: 0.0 standard drinks    Comment: Wine, rarely  . Drug use: Never  . Sexual activity: Never  Other Topics Concern  . Not on file  Social History Narrative   Has living will   No designated health care POA--requests husband. Alternate would be son [name]   Would accept resuscitation attempts   No tube feeds if cognitively unaware   Social Determinants of Health   Financial Resource Strain:   . Difficulty of Paying Living Expenses: Not on file  Food Insecurity:   . Worried About Charity fundraiser in the Last Year: Not on file  . Ran Out of Food in the Last Year: Not on file  Transportation Needs:    . Lack of Transportation (Medical): Not on file  . Lack of Transportation (Non-Medical): Not on file  Physical Activity:   . Days of Exercise per Week: Not on file  . Minutes of Exercise per Session: Not on file  Stress:   . Feeling of Stress : Not on file  Social Connections:   . Frequency of Communication with Friends and Family: Not on file  . Frequency of Social Gatherings with Friends and Family: Not on file  . Attends Religious Services: Not on file  . Active Member of Clubs or Organizations: Not on file  . Attends Archivist Meetings: Not on file  . Marital Status: Not on file  Intimate Partner Violence:   . Fear of Current or Ex-Partner: Not on file  . Emotionally Abused: Not on file  . Physically Abused: Not on file  . Sexually Abused: Not on file   Review of Systems     Objective:   Physical Exam  Musculoskeletal:     Comments: Deformities and tenderness at both [facility] joints. Can't make fist  Right foot appears normal---tenderness in forefoot by great toe           Assessment & Plan:

## 2019-07-30 NOTE — Patient Instructions (Signed)
Try over the counter diclofenac gel on the painful areas of your hands three to four times a day. If you are having a particularly bad day, you can try ibuprofen (advil) 2 tabs (400mg ).  For you foot, get new shoes----at least stability if not motion control. Walk in the store and find a shoe that seems to support you without pain. ([employer] is in the [employer] shopping center)

## 2019-07-30 NOTE — Assessment & Plan Note (Signed)
Takes omeprazole daily---sometimes needs the second one

## 2019-07-30 NOTE — Assessment & Plan Note (Signed)
Hands but mostly [place] Also in right foot  Suggested trying diclofenac gel for her hands-----and using ibuprofen 400mg  for severe pain. Will renew the omeprazole  For her foot, try new shoes may help. Can consider reevaluation by podiatrist

## 2019-10-17 ENCOUNTER — Encounter: Payer: Medicare Other | Admitting: Dermatology

## 2019-11-07 ENCOUNTER — Telehealth: Payer: Self-pay | Admitting: Podiatry

## 2019-11-07 NOTE — Telephone Encounter (Signed)
Left message letting pt know she would need to fill out and sign a medical records release form authorizing [place] to release her records to Dr. Doran Durand at [facility]. Told her that we could e-mail it to her, fax it, or she could come by the [place] office to fill it out. Asked pt to call me back to let me know how she wanted to obtain her records.

## 2019-11-07 NOTE — Telephone Encounter (Signed)
I need to talk to someone about sending records to Dr. Wylene Simmer in [place]. Please call me on my home number. Thank you very much.

## 2019-12-04 DIAGNOSIS — M19071 Primary osteoarthritis, right ankle and foot: Secondary | ICD-10-CM | POA: Diagnosis not present

## 2019-12-04 DIAGNOSIS — R2689 Other abnormalities of gait and mobility: Secondary | ICD-10-CM | POA: Diagnosis not present

## 2019-12-04 DIAGNOSIS — M2041 Other hammer toe(s) (acquired), right foot: Secondary | ICD-10-CM | POA: Diagnosis not present

## 2019-12-04 DIAGNOSIS — M79671 Pain in right foot: Secondary | ICD-10-CM | POA: Diagnosis not present

## 2019-12-04 DIAGNOSIS — M19079 Primary osteoarthritis, unspecified ankle and foot: Secondary | ICD-10-CM | POA: Diagnosis not present

## 2019-12-27 ENCOUNTER — Ambulatory Visit (INDEPENDENT_AMBULATORY_CARE_PROVIDER_SITE_OTHER): Payer: Medicare Other | Admitting: Internal Medicine

## 2019-12-27 ENCOUNTER — Other Ambulatory Visit: Payer: Self-pay

## 2019-12-27 ENCOUNTER — Ambulatory Visit (INDEPENDENT_AMBULATORY_CARE_PROVIDER_SITE_OTHER)
Admission: RE | Admit: 2019-12-27 | Discharge: 2019-12-27 | Disposition: A | Discharge status: Home / Self Care | Payer: Medicare Other | Source: Ambulatory Visit | Attending: Internal Medicine | Admitting: Internal Medicine

## 2019-12-27 ENCOUNTER — Encounter: Payer: Self-pay | Admitting: Internal Medicine

## 2019-12-27 VITALS — BP 128/82 | HR 65 | Temp 98.1°F | Ht 60.25 in | Wt 114.0 lb

## 2019-12-27 DIAGNOSIS — Z91018 Allergy to other foods: Secondary | ICD-10-CM

## 2019-12-27 DIAGNOSIS — I10 Essential (primary) hypertension: Secondary | ICD-10-CM | POA: Diagnosis not present

## 2019-12-27 DIAGNOSIS — R079 Chest pain, unspecified: Secondary | ICD-10-CM

## 2019-12-27 DIAGNOSIS — K219 Gastro-esophageal reflux disease without esophagitis: Secondary | ICD-10-CM

## 2019-12-27 DIAGNOSIS — M159 Polyosteoarthritis, unspecified: Secondary | ICD-10-CM

## 2019-12-27 DIAGNOSIS — Z0001 Encounter for general adult medical examination with abnormal findings: Secondary | ICD-10-CM

## 2019-12-27 DIAGNOSIS — Z7189 Other specified counseling: Secondary | ICD-10-CM

## 2019-12-27 DIAGNOSIS — Z0000 Encounter for general adult medical examination without abnormal findings: Secondary | ICD-10-CM

## 2019-12-27 LAB — CBC
HCT: 42.7 % (ref 36.0–46.0)
Hemoglobin: 14.6 g/dL (ref 12.0–15.0)
MCHC: 34 g/dL (ref 30.0–36.0)
MCV: 90.1 fl (ref 78.0–100.0)
Platelets: 281 10*3/uL (ref 150.0–400.0)
RBC: 4.74 Mil/uL (ref 3.87–5.11)
RDW: 12.3 % (ref 11.5–15.5)
WBC: 9.3 10*3/uL (ref 4.0–10.5)

## 2019-12-27 LAB — T4, FREE: Free T4: 0.9 ng/dL (ref 0.60–1.60)

## 2019-12-27 LAB — COMPREHENSIVE METABOLIC PANEL
ALT: 21 U/L (ref 0–35)
AST: 19 U/L (ref 0–37)
Albumin: 4.5 g/dL (ref 3.5–5.2)
Alkaline Phosphatase: 63 U/L (ref 39–117)
BUN: 14 mg/dL (ref 6–23)
CO2: 30 mEq/L (ref 19–32)
Calcium: 9.8 mg/dL (ref 8.4–10.5)
Chloride: 102 mEq/L (ref 96–112)
Creatinine, Ser: 0.87 mg/dL (ref 0.40–1.20)
GFR: 62.76 mL/min (ref >60.00)
Glucose, Bld: 99 mg/dL (ref 70–99)
Potassium: 4 mEq/L (ref 3.5–5.1)
Sodium: 140 mEq/L (ref 135–145)
Total Bilirubin: 0.6 mg/dL (ref 0.2–1.2)
Total Protein: 7 g/dL (ref 6.0–8.3)

## 2019-12-27 LAB — LIPID PANEL
Cholesterol: 188 mg/dL (ref 0–200)
HDL: 38.2 mg/dL — ABNORMAL LOW (ref >39.00)
LDL Cholesterol: 116 mg/dL — ABNORMAL HIGH (ref 0–99)
NonHDL: 149.6
Total CHOL/HDL Ratio: 5
Triglycerides: 170 mg/dL — ABNORMAL HIGH (ref 0.0–149.0)
VLDL: 34 mg/dL (ref 0.0–40.0)

## 2019-12-27 MED ORDER — EPINEPHRINE 0.3 MG/0.3ML IJ SOAJ: 0.3000 mg | INTRAMUSCULAR | 1 refills | Status: DC | PRN

## 2019-12-27 NOTE — Assessment & Plan Note (Addendum)
I have personally reviewed the Medicare Annual Wellness questionnaire and have noted 1. The patient's medical and social history 2. Their use of alcohol, tobacco or illicit drugs 3. Their current medications and supplements 4. The patient's functional ability including ADL's, fall risks, home safety risks and hearing or visual             impairment. 5. Diet and physical activities 6. Evidence for depression or mood disorders  The patients weight, height, BMI and visual acuity have been recorded in the chart I have made referrals, counseling and provided education to the patient based review of the above and I have provided the pt with a written personalized care plan for preventive services.  I have provided you with a copy of your personalized plan for preventive services. Please take the time to review along with your updated medication list.  Done with cancer screening Discussed trying something in the gym Flu vaccine in the fall Needs to go to audiologist and consider aides

## 2019-12-27 NOTE — Assessment & Plan Note (Signed)
Hands and right foot are worst Will try PT

## 2019-12-27 NOTE — Assessment & Plan Note (Addendum)
Non specific but could be GERD vs coronary ischemia EKG-- sinus bradycardia at 51, LAD, diffuse anterolateral T wave inversions. No hypertrophy. No real change since 07/28/17---except rate is lower CXR normal (await overread) Will refer to cardiology

## 2019-12-27 NOTE — Assessment & Plan Note (Signed)
Keeps the epipen

## 2019-12-27 NOTE — Progress Notes (Signed)
Subjective:    Patient ID: Angela Bowen, female    DOB: 26-Jun-1941, 79 y.o.   MRN: [id number]  HPI Here with husband for Medicare wellness visit and follow up of chronic health conditions This visit occurred during the SARS-CoV-2 public health emergency.  Safety protocols were in place, including screening questions prior to the visit, additional usage of staff PPE, and extensive cleaning of exam room while observing appropriate contact time as indicated for disinfecting solutions.   Reviewed form and advanced directives Reviewed other doctors No alcohol or tobacco Not really exercising--discussed Vision is okay Worried about hearing---has decreased. Discussed hearing aides No falls No depression or anhedonia--just frustrated trying to find new house/down size Independent with instrumental ADLs Mild memory issues--nothing worrisome  Having some chest pain at times Comes and goes---"doesn't feel right" No radiation Not related to eating or activity No diaphoresis or nausea No palpitations No dizziness or syncope No edema  Also gets back pain in middle of shoulder blades Mostly if sweeping or other activities (or standing at stove)---constant aching Better with rest No SOB No meds---it just goes away  Takes omeprazole daily on empty stomach Some heartburn still---with spicy foods or eating later No dysphagia  Ongoing right foot problems Saw podiatrist and [facility] Stability shoes have helped Wants to try PT Uses aleve/advil  Current Outpatient Medications on File Prior to Visit  Medication Sig Dispense Refill  . benazepril-hydrochlorthiazide (LOTENSIN HCT) 10-12.5 MG tablet TAKE 1 TABLET DAILY 90 tablet 3  . EPINEPHrine 0.3 mg/0.3 mL IJ SOAJ injection Inject 0.3 mLs (0.3 mg total) into the skin as needed. 2 Device 1  . Multiple Vitamin (MULTIVITAMIN) tablet Take 1 tablet by mouth daily.      Marland Kitchen omeprazole (PRILOSEC) 20 MG capsule Take 1 capsule (20 mg  total) by mouth 2 (two) times daily before a meal. 180 capsule 3  . [DISCONTINUED] Calcium-Magnesium (CALCIUM MAGNESIUM 750) 300-300 MG TABS Take by mouth daily.      No current facility-administered medications on file prior to visit.    Allergies  Allergen Reactions  . Meat [Alpha-Gal]     RED MEAT-CAUSES SEVERE HIVES,SOB  . Nsaids Anaphylaxis  . Aspirin     High doses - intolerant/causes hives  . Codeine     REACTION: rash    Past Medical History:  Diagnosis Date  . Cancer ([place])    stage II melanoma; right leg;face  . Diverticulosis of colon   . Gallstones   . GERD (gastroesophageal reflux disease)   . History of skin cancer   . Hypertension   . Normal stress echocardiogram 01/2006   echo 5/98, fairly normal  . Osteoarthritis   . Osteoporosis, unspecified   . S/P sclerotherapy of varicose veins 07/30/06   left leg, Dr. Eilleen Kempf  . Skin cancer    STAGE II ON RIGHT LEG    Past Surgical History:  Procedure Laterality Date  . CHOLECYSTECTOMY  2001  . FOOT SURGERY     right  . MELANOMA EXCISION  7/10   leg  . MOHS SURGERY    . VARICOSE VEIN SURGERY  1984   bilateral  . VARICOSE VEIN SURGERY  2004   right foot    Family History  Problem Relation Age of Onset  . Alcohol abuse Mother   . Colon cancer Mother   . Osteoporosis Mother   . Alcohol abuse Father   . Diabetes Father   . Lung cancer Brother   . Ovarian cancer  Maternal Grandmother   . Breast cancer Neg Hx     Social History   Socioeconomic History  . Marital status: Married    Spouse name: Not on file  . Number of children: 2  . Years of education: Not on file  . Highest education level: Not on file  Occupational History  . Occupation: homemaker  Tobacco Use  . Smoking status: Never Smoker  . Smokeless tobacco: Never Used  Substance and Sexual Activity  . Alcohol use: Yes    Alcohol/week: 0.0 standard drinks    Comment: Wine, rarely  . Drug use: Never  . Sexual activity: Never  Other  Topics Concern  . Not on file  Social History Narrative   Has living will   No designated health care POA--requests husband. Alternate would be son [name]   Would accept resuscitation attempts   No tube feeds if cognitively unaware   Social Determinants of Health   Financial Resource Strain:   . Difficulty of Paying Living Expenses:   Food Insecurity:   . Worried About Charity fundraiser in the Last Year:   . Arboriculturist in the Last Year:   Transportation Needs:   . Film/video editor (Medical):   Marland Kitchen Lack of Transportation (Non-Medical):   Physical Activity:   . Days of Exercise per Week:   . Minutes of Exercise per Session:   Stress:   . Feeling of Stress :   Social Connections:   . Frequency of Communication with Friends and Family:   . Frequency of Social Gatherings with Friends and Family:   . Attends Religious Services:   . Active Member of Clubs or Organizations:   . Attends Archivist Meetings:   Marland Kitchen Marital Status:   Intimate Partner Violence:   . Fear of Current or Ex-Partner:   . Emotionally Abused:   Marland Kitchen Physically Abused:   . Sexually Abused:    Review of Systems Appetite is okay Weight is down Continues to avoid meat---alpha gal Sleeps fair---uses aleve pm prn (less than weekly). Husband concerned about her breathing--some apnea in the past but not recently. No sig daytime somnolence Wears seat belt Needs to find dentist---teeth okay Bowels are fine-no blood Urinary frequency--no incontinence    Objective:   Physical Exam Constitutional:      General: She is not in acute distress.    Appearance: She is well-developed.  HENT:     Head: Normocephalic and atraumatic.  Cardiovascular:     Rate and Rhythm: Normal rate and regular rhythm.     Heart sounds: Normal heart sounds. No murmur heard.  No gallop.   Pulmonary:     Effort: Pulmonary effort is normal. No respiratory distress.     Breath sounds: Normal breath sounds.    Abdominal:     Palpations: Abdomen is soft. There is no mass.  Musculoskeletal:     Cervical back: Normal range of motion.     Comments: Sig hand arthritis Right foot is worst  Lymphadenopathy:     Cervical: No cervical adenopathy.  Skin:    General: Skin is warm.     Findings: No rash.  Neurological:     General: No focal deficit present.     Mental Status: She is alert.     Comments: President--- "Edmon Crape, Trump Obama" [phone] something [id number] Recall 3/3  Psychiatric:        Mood and Affect: Mood normal.  Behavior: Behavior normal.            Assessment & Plan:

## 2019-12-27 NOTE — Progress Notes (Signed)
Hearing Screening   125Hz  250Hz  500Hz  1000Hz  2000Hz  3000Hz  4000Hz  6000Hz  8000Hz   Right ear:           Left ear:           Comments: Inadequate test Last Year. Pt needs to see audiology   Visual Acuity Screening   Right eye Left eye Both eyes  Without correction: 20/30 20/25 20/30   With correction:

## 2019-12-27 NOTE — Assessment & Plan Note (Signed)
BP Readings from Last 3 Encounters:  12/27/19 128/82  07/30/19 118/70  02/20/19 (!) 116/53   Good control  Will check labs

## 2019-12-27 NOTE — Assessment & Plan Note (Signed)
Ongoing symptoms despite daily omeprazole Increase to bid

## 2019-12-27 NOTE — Assessment & Plan Note (Signed)
See social history 

## 2020-01-03 ENCOUNTER — Telehealth: Payer: Self-pay | Admitting: Internal Medicine

## 2020-01-03 DIAGNOSIS — M79671 Pain in right foot: Secondary | ICD-10-CM

## 2020-01-03 NOTE — Telephone Encounter (Signed)
[facility] says they need a New PT order with the specific body part that needs the PT. Your order just says Osteoarthritis. Please place New PT order.

## 2020-01-05 DIAGNOSIS — M79671 Pain in right foot: Secondary | ICD-10-CM | POA: Insufficient documentation

## 2020-01-09 DIAGNOSIS — G8929 Other chronic pain: Secondary | ICD-10-CM | POA: Diagnosis not present

## 2020-01-09 DIAGNOSIS — M6281 Muscle weakness (generalized): Secondary | ICD-10-CM | POA: Diagnosis not present

## 2020-01-09 DIAGNOSIS — R262 Difficulty in walking, not elsewhere classified: Secondary | ICD-10-CM | POA: Diagnosis not present

## 2020-01-09 DIAGNOSIS — M79671 Pain in right foot: Secondary | ICD-10-CM | POA: Diagnosis not present

## 2020-01-09 DIAGNOSIS — M2560 Stiffness of unspecified joint, not elsewhere classified: Secondary | ICD-10-CM | POA: Diagnosis not present

## 2020-01-15 DIAGNOSIS — M79671 Pain in right foot: Secondary | ICD-10-CM | POA: Diagnosis not present

## 2020-01-15 DIAGNOSIS — G8929 Other chronic pain: Secondary | ICD-10-CM | POA: Diagnosis not present

## 2020-01-20 ENCOUNTER — Other Ambulatory Visit: Payer: Self-pay

## 2020-01-20 ENCOUNTER — Encounter: Payer: Self-pay | Admitting: Cardiology

## 2020-01-20 ENCOUNTER — Ambulatory Visit (INDEPENDENT_AMBULATORY_CARE_PROVIDER_SITE_OTHER): Payer: Medicare Other | Admitting: Cardiology

## 2020-01-20 VITALS — BP 150/70 | HR 72 | Ht 60.0 in | Wt 115.2 lb

## 2020-01-20 DIAGNOSIS — I10 Essential (primary) hypertension: Secondary | ICD-10-CM

## 2020-01-20 DIAGNOSIS — R9431 Abnormal electrocardiogram [ECG] [EKG]: Secondary | ICD-10-CM

## 2020-01-20 DIAGNOSIS — R079 Chest pain, unspecified: Secondary | ICD-10-CM

## 2020-01-20 NOTE — Patient Instructions (Signed)
Medication Instructions:  Your physician recommends that you continue on your current medications as directed. Please refer to the Current Medication list given to you today.  *If you need a refill on your cardiac medications before your next appointment, please call your pharmacy*   Lab Work: None ordered If you have labs (blood work) drawn today and your tests are completely normal, you will receive your results only by: . MyChart Message (if you have MyChart) OR . A paper copy in the mail If you have any lab test that is abnormal or we need to change your treatment, we will call you to review the results.   Testing/Procedures: None ordered   Follow-Up: At [facility], you and your health needs are our priority.  As part of our continuing mission to provide you with exceptional heart care, we have created designated Provider Care Teams.  These Care Teams include your primary Cardiologist (physician) and Advanced Practice Providers (APPs -  Physician Assistants and Nurse Practitioners) who all work together to provide you with the care you need, when you need it.  We recommend signing up for the patient portal called "MyChart".  Sign up information is provided on this After Visit Summary.  MyChart is used to connect with patients for Virtual Visits (Telemedicine).  Patients are able to view lab/test results, encounter notes, upcoming appointments, etc.  Non-urgent messages can be sent to your provider as well.   To learn more about what you can do with MyChart, go to [email or web address].    Your next appointment:   As needed   The format for your next appointment:   In Person  Provider:    You may see Brian Agbor-Etang, MD   or one of the following Advanced Practice Providers on your designated Care Team:    Christopher Berge, NP  Ryan Dunn, PA-C  Jacquelyn Visser, PA-C    Other Instructions N/A  

## 2020-01-20 NOTE — Progress Notes (Signed)
Cardiology Office Note:    Date:  01/20/2020   ID:  Angela Bowen, DOB [birth year], MRN [id number]  PCP:  Venia Carbon, MD  [facility] Cardiologist:  Kate Sable, MD  [facility] Electrophysiologist:  None   Referring MD: Venia Carbon, MD   Chief Complaint  Patient presents with  . OTHER    C/o back shoulder pain when doing something repeative and abn EKG. Meds reviewed verbally with pt.    History of Present Illness:    Angela Bowen is a 79 y.o. female with a hx of hypertension, GERD who presents due to back pain and abnormal ECG.  Patient states having symptoms of back pain usually after performing activities such as sweeping, mopping the floor, cooking.  She denies chest pain, shortness of breath, edema, palpitations.  She has 2 stairs at home when she walks on without any symptoms.  She denies any history of heart disease, denies smoking.  She is able to perform all activities of daily living without any chest pain or shortness of breath.  Walking or exertion does not reproduce back pain, it only occurs whenever she does activities with her hands as mentioned above.  She has a history of reflux, controlled with omeprazole.  Followed up with primary care provider who obtained an ECG and anterolateral T wave inversions were noted.  Patient otherwise feels fine.  Patient was seen in 2013 due to chest pain.  She underwent exercise tolerance test via treadmill.  No significant EKG changes or concerns for ischemia noted good exercise tolerance.  Past Medical History:  Diagnosis Date  . Cancer ([place])    stage II melanoma; right leg;face  . Diverticulosis of colon   . Gallstones   . GERD (gastroesophageal reflux disease)   . History of skin cancer   . Hyperlipidemia   . Hypertension   . Normal stress echocardiogram 01/2006   echo 5/98, fairly normal  . Osteoarthritis   . Osteoporosis, unspecified   . S/P sclerotherapy of varicose veins 07/30/06   left leg,  Dr. Eilleen Kempf  . Skin cancer    STAGE II ON RIGHT LEG    Past Surgical History:  Procedure Laterality Date  . CHOLECYSTECTOMY  2001  . FOOT SURGERY     right  . MELANOMA EXCISION  7/10   leg  . MOHS SURGERY    . [place]   bilateral  . VARICOSE VEIN SURGERY  2004   right foot    Current Medications: Current Meds  Medication Sig  . benazepril-hydrochlorthiazide (LOTENSIN HCT) 10-12.5 MG tablet TAKE 1 TABLET DAILY  . EPINEPHrine 0.3 mg/0.3 mL IJ SOAJ injection Inject 0.3 mLs (0.3 mg total) into the skin as needed.  . Multiple Vitamin (MULTIVITAMIN) tablet Take 1 tablet by mouth daily.    Marland Kitchen omeprazole (PRILOSEC) 20 MG capsule Take 1 capsule (20 mg total) by mouth 2 (two) times daily before a meal.     Allergies:   Meat [alpha-gal], Nsaids, Aspirin, and Codeine   Social History   Socioeconomic History  . Marital status: Married    Spouse name: Not on file  . Number of children: 2  . Years of education: Not on file  . Highest education level: Not on file  Occupational History  . Occupation: homemaker  Tobacco Use  . Smoking status: Never Smoker  . Smokeless tobacco: Never Used  Substance and Sexual Activity  . Alcohol use: Yes    Alcohol/week: 0.0  standard drinks    Comment: Wine, rarely  . Drug use: Never  . Sexual activity: Never  Other Topics Concern  . Not on file  Social History Narrative   Has living will   No designated health care POA--requests husband. Alternate would be son [name]   Would accept resuscitation attempts   No tube feeds if cognitively unaware   Social Determinants of Health   Financial Resource Strain:   . Difficulty of Paying Living Expenses:   Food Insecurity:   . Worried About Charity fundraiser in the Last Year:   . Arboriculturist in the Last Year:   Transportation Needs:   . Film/video editor (Medical):   Marland Kitchen Lack of Transportation (Non-Medical):   Physical Activity:   . Days of Exercise per Week:     . Minutes of Exercise per Session:   Stress:   . Feeling of Stress :   Social Connections:   . Frequency of Communication with Friends and Family:   . Frequency of Social Gatherings with Friends and Family:   . Attends Religious Services:   . Active Member of Clubs or Organizations:   . Attends Archivist Meetings:   Marland Kitchen Marital Status:      Family History: The patient's family history includes Alcohol abuse in her father and mother; Colon cancer in her mother; Diabetes in her father; Lung cancer in her brother; Osteoporosis in her mother; Ovarian cancer in her maternal grandmother. There is no history of Breast cancer.  ROS:   Please see the history of present illness.     All other systems reviewed and are negative.  EKGs/Labs/Other Studies Reviewed:    The following studies were reviewed today:   EKG:  EKG is  ordered today.  The ekg ordered today demonstrates sinus rhythm, anterolateral T wave elevations.  Recent Labs: 12/27/2019: ALT 21; BUN 14; Creatinine, Ser 0.87; Hemoglobin 14.6; Platelets 281.0; Potassium 4.0; Sodium 140  Recent Lipid Panel    Component Value Date/Time   CHOL 188 12/27/2019 0941   TRIG 170.0 (H) 12/27/2019 0941   HDL 38.20 (L) 12/27/2019 0941   CHOLHDL 5 12/27/2019 0941   VLDL 34.0 12/27/2019 0941   LDLCALC 116 (H) 12/27/2019 0941   LDLDIRECT 121.8 01/30/2012 1240    Physical Exam:    VS:  BP (!) 150/70 (BP Location: Right Arm, Patient Position: Sitting, Cuff Size: Normal)   Pulse 72   Ht 5' (1.524 m)   Wt 115 lb 4 oz (52.3 kg)   SpO2 98%   BMI 22.51 kg/m     Wt Readings from Last 3 Encounters:  01/20/20 115 lb 4 oz (52.3 kg)  12/27/19 114 lb (51.7 kg)  07/30/19 119 lb (54 kg)     GEN:  Well nourished, well developed in no acute distress HEENT: Normal NECK: No JVD; No carotid bruits LYMPHATICS: No lymphadenopathy CARDIAC: RRR, no murmurs, rubs, gallops RESPIRATORY:  Clear to auscultation without rales, wheezing or rhonchi   ABDOMEN: Soft, non-tender, non-distended MUSCULOSKELETAL:  No edema; No deformity  SKIN: Warm and dry NEUROLOGIC:  Alert and oriented x 3 PSYCHIATRIC:  Normal affect   ASSESSMENT:    1. Chest pain of uncertain etiology   2. Essential hypertension   3. EKG, abnormal    PLAN:    In order of problems listed above:  1. Patient with atypical back pain, between shoulder blades associated with arm exertion.  This is likely musculoskeletal in etiology  as patient denies any symptoms consistent with angina, back pain does not occur when she does not perform hand activities.  Able to walk, go up stairs, without any chestpain or shortness of breath.  Patient reassured, she was educated on in angina symptoms.  If this was to occur, we will consider further testing. 2. History of hypertension, continue BP meds.  Elevated today, typically normal.  Continue to monitor with follow-up with primary care provider. 3. History of abnormal ECG, T wave inversions on EKG are nonspecific and not new, seen on prior EKGs.  No indication for additional testing as patient has no other cardiac symptoms.  Follow-up as needed. Total encounter time 60 minutes  Greater than 50% was spent in counseling and coordination of care with the patient   This note was generated in part or whole with voice recognition software. Voice recognition is usually quite accurate but there are transcription errors that can and very often do occur. I apologize for any typographical errors that were not detected and corrected.  Medication Adjustments/Labs and Tests Ordered: Current medicines are reviewed at length with the patient today.  Concerns regarding medicines are outlined above.  Orders Placed This Encounter  Procedures  . EKG 12-Lead   No orders of the defined types were placed in this encounter.   Patient Instructions  Medication Instructions:  Your physician recommends that you continue on your current medications as  directed. Please refer to the Current Medication list given to you today.  *If you need a refill on your cardiac medications before your next appointment, please call your pharmacy*   Lab Work: None ordered If you have labs (blood work) drawn today and your tests are completely normal, you will receive your results only by: Marland Kitchen MyChart Message (if you have MyChart) OR . A paper copy in the mail If you have any lab test that is abnormal or we need to change your treatment, we will call you to review the results.   Testing/Procedures: None ordered   Follow-Up: At [facility], you and your health needs are our priority.  As part of our continuing mission to provide you with exceptional heart care, we have created designated Provider Care Teams.  These Care Teams include your primary Cardiologist (physician) and Advanced Practice Providers (APPs -  Physician Assistants and Nurse Practitioners) who all work together to provide you with the care you need, when you need it.  We recommend signing up for the patient portal called "MyChart".  Sign up information is provided on this After Visit Summary.  MyChart is used to connect with patients for Virtual Visits (Telemedicine).  Patients are able to view lab/test results, encounter notes, upcoming appointments, etc.  Non-urgent messages can be sent to your provider as well.   To learn more about what you can do with MyChart, go to [email or web address].    Your next appointment:   As needed  The format for your next appointment:   In Person  Provider:    You may see Kate Sable, MD or one of the following Advanced Practice Providers on your designated Care Team:    Murray Hodgkins, NP  Christell Faith, PA-C  Marrianne Mood, PA-C    Other Instructions N/A     Signed, Kate Sable, MD  01/20/2020 8:49 AM    [place]

## 2020-01-21 DIAGNOSIS — G8929 Other chronic pain: Secondary | ICD-10-CM | POA: Diagnosis not present

## 2020-01-21 DIAGNOSIS — M79671 Pain in right foot: Secondary | ICD-10-CM | POA: Diagnosis not present

## 2020-02-19 DIAGNOSIS — Z961 Presence of intraocular lens: Secondary | ICD-10-CM | POA: Diagnosis not present

## 2020-04-30 ENCOUNTER — Other Ambulatory Visit: Payer: Self-pay | Admitting: Internal Medicine

## 2020-05-08 DIAGNOSIS — Z23 Encounter for immunization: Secondary | ICD-10-CM | POA: Diagnosis not present

## 2020-08-15 ENCOUNTER — Other Ambulatory Visit: Payer: Self-pay | Admitting: Internal Medicine

## 2020-10-26 ENCOUNTER — Inpatient Hospital Stay: Payer: Medicare Other

## 2020-10-26 ENCOUNTER — Ambulatory Visit: Payer: Self-pay

## 2020-10-26 ENCOUNTER — Emergency Department: Payer: Medicare Other

## 2020-10-26 ENCOUNTER — Other Ambulatory Visit: Payer: Self-pay

## 2020-10-26 ENCOUNTER — Encounter: Payer: Self-pay | Admitting: Emergency Medicine

## 2020-10-26 ENCOUNTER — Inpatient Hospital Stay
Admission: EM | Admit: 2020-10-26 | Discharge: 2020-10-28 | DRG: 066 | Disposition: A | Discharge status: Home / Self Care | Payer: Medicare Other | Attending: Internal Medicine | Admitting: Internal Medicine

## 2020-10-26 DIAGNOSIS — I6522 Occlusion and stenosis of left carotid artery: Secondary | ICD-10-CM | POA: Diagnosis present

## 2020-10-26 DIAGNOSIS — I672 Cerebral atherosclerosis: Secondary | ICD-10-CM | POA: Diagnosis not present

## 2020-10-26 DIAGNOSIS — R29702 NIHSS score 2: Secondary | ICD-10-CM | POA: Diagnosis present

## 2020-10-26 DIAGNOSIS — I679 Cerebrovascular disease, unspecified: Secondary | ICD-10-CM | POA: Diagnosis present

## 2020-10-26 DIAGNOSIS — M810 Age-related osteoporosis without current pathological fracture: Secondary | ICD-10-CM | POA: Diagnosis present

## 2020-10-26 DIAGNOSIS — E785 Hyperlipidemia, unspecified: Secondary | ICD-10-CM | POA: Diagnosis present

## 2020-10-26 DIAGNOSIS — Z886 Allergy status to analgesic agent status: Secondary | ICD-10-CM

## 2020-10-26 DIAGNOSIS — Z79899 Other long term (current) drug therapy: Secondary | ICD-10-CM | POA: Diagnosis not present

## 2020-10-26 DIAGNOSIS — Z800 Family history of malignant neoplasm of digestive organs: Secondary | ICD-10-CM | POA: Diagnosis not present

## 2020-10-26 DIAGNOSIS — G8311 Monoplegia of lower limb affecting right dominant side: Secondary | ICD-10-CM | POA: Diagnosis present

## 2020-10-26 DIAGNOSIS — R531 Weakness: Secondary | ICD-10-CM | POA: Diagnosis not present

## 2020-10-26 DIAGNOSIS — I639 Cerebral infarction, unspecified: Secondary | ICD-10-CM | POA: Diagnosis not present

## 2020-10-26 DIAGNOSIS — I6389 Other cerebral infarction: Secondary | ICD-10-CM

## 2020-10-26 DIAGNOSIS — R29898 Other symptoms and signs involving the musculoskeletal system: Secondary | ICD-10-CM

## 2020-10-26 DIAGNOSIS — Z8673 Personal history of transient ischemic attack (TIA), and cerebral infarction without residual deficits: Secondary | ICD-10-CM | POA: Diagnosis present

## 2020-10-26 DIAGNOSIS — I6502 Occlusion and stenosis of left vertebral artery: Secondary | ICD-10-CM | POA: Diagnosis present

## 2020-10-26 DIAGNOSIS — I771 Stricture of artery: Secondary | ICD-10-CM | POA: Diagnosis not present

## 2020-10-26 DIAGNOSIS — K219 Gastro-esophageal reflux disease without esophagitis: Secondary | ICD-10-CM | POA: Diagnosis present

## 2020-10-26 DIAGNOSIS — Z8262 Family history of osteoporosis: Secondary | ICD-10-CM | POA: Diagnosis not present

## 2020-10-26 DIAGNOSIS — Z9049 Acquired absence of other specified parts of digestive tract: Secondary | ICD-10-CM

## 2020-10-26 DIAGNOSIS — M1990 Unspecified osteoarthritis, unspecified site: Secondary | ICD-10-CM | POA: Diagnosis present

## 2020-10-26 DIAGNOSIS — I10 Essential (primary) hypertension: Secondary | ICD-10-CM | POA: Diagnosis present

## 2020-10-26 DIAGNOSIS — I63412 Cerebral infarction due to embolism of left middle cerebral artery: Principal | ICD-10-CM | POA: Diagnosis present

## 2020-10-26 DIAGNOSIS — Z8041 Family history of malignant neoplasm of ovary: Secondary | ICD-10-CM

## 2020-10-26 DIAGNOSIS — I6503 Occlusion and stenosis of bilateral vertebral arteries: Secondary | ICD-10-CM | POA: Diagnosis not present

## 2020-10-26 DIAGNOSIS — I708 Atherosclerosis of other arteries: Secondary | ICD-10-CM | POA: Diagnosis present

## 2020-10-26 DIAGNOSIS — Z885 Allergy status to narcotic agent status: Secondary | ICD-10-CM

## 2020-10-26 DIAGNOSIS — E042 Nontoxic multinodular goiter: Secondary | ICD-10-CM | POA: Diagnosis present

## 2020-10-26 DIAGNOSIS — Z833 Family history of diabetes mellitus: Secondary | ICD-10-CM

## 2020-10-26 DIAGNOSIS — Z85820 Personal history of malignant melanoma of skin: Secondary | ICD-10-CM | POA: Diagnosis not present

## 2020-10-26 DIAGNOSIS — I700 Atherosclerosis of aorta: Secondary | ICD-10-CM | POA: Diagnosis present

## 2020-10-26 DIAGNOSIS — Z801 Family history of malignant neoplasm of trachea, bronchus and lung: Secondary | ICD-10-CM | POA: Diagnosis not present

## 2020-10-26 DIAGNOSIS — Z20822 Contact with and (suspected) exposure to covid-19: Secondary | ICD-10-CM | POA: Diagnosis present

## 2020-10-26 LAB — CBC
HCT: 42 % (ref 36.0–46.0)
Hemoglobin: 14.5 g/dL (ref 12.0–15.0)
MCH: 30.5 pg (ref 26.0–34.0)
MCHC: 34.5 g/dL (ref 30.0–36.0)
MCV: 88.4 fL (ref 80.0–100.0)
Platelets: 331 10*3/uL (ref 150–400)
RBC: 4.75 MIL/uL (ref 3.87–5.11)
RDW: 11.9 % (ref 11.5–15.5)
WBC: 8.9 10*3/uL (ref 4.0–10.5)
nRBC: 0 % (ref 0.0–0.2)

## 2020-10-26 LAB — DIFFERENTIAL
Abs Immature Granulocytes: 0.02 10*3/uL (ref 0.00–0.07)
Basophils Absolute: 0.2 10*3/uL — ABNORMAL HIGH (ref 0.0–0.1)
Basophils Relative: 2 %
Eosinophils Absolute: 0.2 10*3/uL (ref 0.0–0.5)
Eosinophils Relative: 2 %
Immature Granulocytes: 0 %
Lymphocytes Relative: 26 %
Lymphs Abs: 2.4 10*3/uL (ref 0.7–4.0)
Monocytes Absolute: 0.6 10*3/uL (ref 0.1–1.0)
Monocytes Relative: 6 %
Neutro Abs: 5.7 10*3/uL (ref 1.7–7.7)
Neutrophils Relative %: 64 %

## 2020-10-26 LAB — COMPREHENSIVE METABOLIC PANEL
ALT: 18 U/L (ref 0–44)
AST: 25 U/L (ref 15–41)
Albumin: 4.1 g/dL (ref 3.5–5.0)
Alkaline Phosphatase: 60 U/L (ref 38–126)
Anion gap: 10 (ref 5–15)
BUN: 14 mg/dL (ref 8–23)
CO2: 25 mmol/L (ref 22–32)
Calcium: 9.1 mg/dL (ref 8.9–10.3)
Chloride: 101 mmol/L (ref 98–111)
Creatinine, Ser: 0.95 mg/dL (ref 0.44–1.00)
GFR, Estimated: >60 mL/min (ref >60)
Glucose, Bld: 133 mg/dL — ABNORMAL HIGH (ref 70–99)
Potassium: 3.7 mmol/L (ref 3.5–5.1)
Sodium: 136 mmol/L (ref 135–145)
Total Bilirubin: 0.9 mg/dL (ref 0.3–1.2)
Total Protein: 7.2 g/dL (ref 6.5–8.1)

## 2020-10-26 LAB — PROTIME-INR
INR: 1 (ref 0.8–1.2)
Prothrombin Time: 13.7 seconds (ref 11.4–15.2)

## 2020-10-26 LAB — RESP PANEL BY RT-PCR (FLU A&B, COVID) ARPGX2
Influenza A by PCR: NEGATIVE
Influenza B by PCR: NEGATIVE
SARS Coronavirus 2 by RT PCR: NEGATIVE

## 2020-10-26 LAB — APTT: aPTT: 29 seconds (ref 24–36)

## 2020-10-26 MED ORDER — ASPIRIN 300 MG RE SUPP: 300.0000 mg | Freq: Every day | RECTAL | Status: DC

## 2020-10-26 MED ORDER — CLOPIDOGREL BISULFATE 75 MG PO TABS
75.0000 mg | ORAL_TABLET | Freq: Every day | ORAL | Status: DC
  Administered 2020-10-26 – 2020-10-28 (×3): 75 mg via ORAL
  Filled 2020-10-26 (×3): qty 1

## 2020-10-26 MED ORDER — LORAZEPAM 1 MG PO TABS
1.0000 mg | ORAL_TABLET | ORAL | Status: DC | PRN
  Administered 2020-10-26: 1 mg via ORAL
  Filled 2020-10-26: qty 1

## 2020-10-26 MED ORDER — CLOPIDOGREL BISULFATE 75 MG PO TABS
300.0000 mg | ORAL_TABLET | Freq: Once | ORAL | Status: AC
  Administered 2020-10-26: 300 mg via ORAL
  Filled 2020-10-26: qty 4

## 2020-10-26 MED ORDER — BUTALBITAL-APAP-CAFFEINE 50-325-40 MG PO TABS
2.0000 | ORAL_TABLET | Freq: Once | ORAL | Status: AC
  Administered 2020-10-26: 2 via ORAL
  Filled 2020-10-26: qty 2

## 2020-10-26 MED ORDER — ADULT MULTIVITAMIN W/MINERALS CH
1.0000 | ORAL_TABLET | Freq: Every day | ORAL | Status: DC
  Administered 2020-10-27 – 2020-10-28 (×2): 1 via ORAL
  Filled 2020-10-26 (×2): qty 1

## 2020-10-26 MED ORDER — ACETAMINOPHEN 325 MG PO TABS: 650.0000 mg | ORAL_TABLET | ORAL | Status: DC | PRN

## 2020-10-26 MED ORDER — ASPIRIN EC 81 MG PO TBEC
81.0000 mg | DELAYED_RELEASE_TABLET | Freq: Every day | ORAL | Status: DC
  Administered 2020-10-26 – 2020-10-28 (×3): 81 mg via ORAL
  Filled 2020-10-26 (×3): qty 1

## 2020-10-26 MED ORDER — ASPIRIN 325 MG PO TABS: 325.0000 mg | ORAL_TABLET | Freq: Every day | ORAL | Status: DC

## 2020-10-26 MED ORDER — ACETAMINOPHEN 160 MG/5ML PO SOLN
650.0000 mg | ORAL | Status: DC | PRN
  Filled 2020-10-26: qty 20.3

## 2020-10-26 MED ORDER — PANTOPRAZOLE SODIUM 40 MG PO TBEC
40.0000 mg | DELAYED_RELEASE_TABLET | Freq: Every day | ORAL | Status: DC
  Administered 2020-10-27 – 2020-10-28 (×2): 40 mg via ORAL
  Filled 2020-10-26 (×2): qty 1

## 2020-10-26 MED ORDER — ATORVASTATIN CALCIUM 20 MG PO TABS: 40.0000 mg | ORAL_TABLET | Freq: Every day | ORAL | Status: DC

## 2020-10-26 MED ORDER — CLOPIDOGREL BISULFATE 75 MG PO TABS
75.0000 mg | ORAL_TABLET | Freq: Every day | ORAL | Status: DC
  Filled 2020-10-26: qty 1

## 2020-10-26 MED ORDER — IOHEXOL 350 MG/ML SOLN
75.0000 mL | Freq: Once | INTRAVENOUS | Status: AC | PRN
  Administered 2020-10-26: 75 mL via INTRAVENOUS

## 2020-10-26 MED ORDER — ACETAMINOPHEN 650 MG RE SUPP: 650.0000 mg | RECTAL | Status: DC | PRN

## 2020-10-26 MED ORDER — SENNOSIDES-DOCUSATE SODIUM 8.6-50 MG PO TABS: 1.0000 | ORAL_TABLET | Freq: Every evening | ORAL | Status: DC | PRN

## 2020-10-26 MED ORDER — STROKE: EARLY STAGES OF RECOVERY BOOK: Freq: Once | Status: AC

## 2020-10-26 NOTE — ED Notes (Signed)
Informed RN bed assigned 

## 2020-10-26 NOTE — H&P (Signed)
History and Physical    Angela Bowen ZOX:[id number] DOB: 09-22-1940 DOA: 10/26/2020  PCP: Karie SchwalbeLetvak, Richard I, MD   Patient coming from: Home  I have personally briefly reviewed [facility]  Chief Complaint: Right leg weakness/unsteady gait  HPI: Angela Bowen is a 80 y.o. female with medical history significant for hypertension, GERD, diverticulosis, osteoporosis and dyslipidemia who presents to the emergency room via private vehicle for evaluation of right leg weakness which she has had for about 3 days.  Patient was in her usual state of health until 3 days ago when she noticed that she was dragging her right leg and had to hold onto surfaces to prevent her from falling.  Her husband notes that her gait has been unsteady.  She also complains of a frontal headache which she rates a 5 x 10 in intensity at its worst.  Per patient she does not usually get headaches. When her symptoms did not improve her husband called the urgent care center to take her for evaluation and was advised to go to the emergency room. She denies having any blurred vision, difficulty swallowing, no dizziness or lightheadedness. She denies having any chest pain, no nausea, no vomiting, no fever, no chills, no cough, no abdominal pain, no urinary symptoms no palpitations, no diaphoresis, no shortness of breath. Labs show sodium 136, potassium 3.7, chloride 101, bicarb 25, glucose 133, BUN creatinine 0.95, calcium 9.1, anion gap 10, alkaline phosphatase 60, albumin 4.1, AST 25, ALT 18, total protein 7.2, white count 8.9, hemoglobin 14.5, hematocrit 42, MCV 88.4, RDW 11.9, platelet count 331, PT 13.7, INR 1.0 Respiratory viral panel is pending CT scan of the head without contrast shows no acute intracranial abnormality. Bilateral cerebral white matter changes most commonly due to chronic small vessel disease. MRI of the brain without contrast shows several scattered punctate and  subcentimeter acute infarctions at the left posterior frontal and parietal vertex affecting the cortical and subcortical brain consistent with micro embolic infarctions in the left MCA territory. Evidence of mass effect or Hemorrhage. Background pattern of chronic small vessel ischemic changes elsewhere throughout the brain. Twelve-lead EKG reviewed by me shows normal sinus rhythm.  ED Course: Patient is a 80 year old female who presents to the ER for evaluation of right leg weakness associated with a headache and unsteady gait.  Initial CT scan of the head without contrast is negative for hemorrhage but MRI shows several scattered punctate and subcentimeter acute infarctions at the left posterior frontal and parietal vertex affecting the cortical and subcortical brain consistent with microembolic infarctions in the left MCA territory.  Patient will be admitted to the hospital for further evaluation.   Review of Systems: As per HPI otherwise all other systems reviewed and negative.    Past Medical History:  Diagnosis Date  . Cancer ([facility])    stage II melanoma; right leg;face  . Diverticulosis of colon   . Gallstones   . GERD (gastroesophageal reflux disease)   . History of skin cancer   . Hyperlipidemia   . Hypertension   . Normal stress echocardiogram 01/2006   echo 5/98, fairly normal  . Osteoarthritis   . Osteoporosis, unspecified   . S/P sclerotherapy of varicose veins 07/30/06   left leg, Dr. Donia AstKrusch  . Skin cancer    STAGE II ON RIGHT LEG    Past Surgical History:  Procedure Laterality Date  . CHOLECYSTECTOMY  2001  . FOOT SURGERY  right  . MELANOMA EXCISION  7/10   leg  . MOHS SURGERY    . [place]   bilateral  . VARICOSE VEIN SURGERY  2004   right foot     reports that she has never smoked. She has never used smokeless tobacco. She reports current alcohol use. She reports that she does not use drugs.  Allergies  Allergen Reactions  . Meat  [Alpha-Gal]     RED MEAT-CAUSES SEVERE HIVES,SOB  . Nsaids Anaphylaxis  . Aspirin     High doses - intolerant/causes hives  . Codeine     REACTION: rash    Family History  Problem Relation Age of Onset  . Alcohol abuse Mother   . Colon cancer Mother   . Osteoporosis Mother   . Alcohol abuse Father   . Diabetes Father   . Lung cancer Brother   . Ovarian cancer Maternal Grandmother   . Breast cancer Neg Hx       Prior to Admission medications   Medication Sig Start Date End Date Taking? Authorizing Provider  benazepril-hydrochlorthiazide (LOTENSIN HCT) 10-12.5 MG tablet TAKE 1 TABLET DAILY 04/30/20  Yes Venia Carbon, MD  Multiple Vitamin (MULTIVITAMIN) tablet Take 1 tablet by mouth daily.   Yes [provider]  omeprazole (PRILOSEC) 20 MG capsule TAKE 1 CAPSULE TWICE A DAY BEFORE MEALS 08/17/20  Yes Viviana Simpler I, MD  EPINEPHrine 0.3 mg/0.3 mL IJ SOAJ injection Inject 0.3 mLs (0.3 mg total) into the skin as needed. 12/27/19   Venia Carbon, MD  Calcium-Magnesium (CALCIUM MAGNESIUM 750) 300-300 MG TABS Take by mouth daily.   09/16/11  [provider]    Physical Exam: Vitals:   10/26/20 0910 10/26/20 0911 10/26/20 1320 10/26/20 1347  BP: (!) 174/64  (!) 167/89 126/83  Pulse: 67  60   Resp: 18  17   Temp: 97.9 F (36.6 C)     TempSrc: Oral     SpO2: 97%  98%   Weight:  49.9 kg    Height:  5\' 1"  (1.549 m)       Vitals:   10/26/20 0910 10/26/20 0911 10/26/20 1320 10/26/20 1347  BP: (!) 174/64  (!) 167/89 126/83  Pulse: 67  60   Resp: 18  17   Temp: 97.9 F (36.6 C)     TempSrc: Oral     SpO2: 97%  98%   Weight:  49.9 kg    Height:  5\' 1"  (1.549 m)        Constitutional: Alert and oriented x 3 . Not in any apparent distress HEENT:      Head: Normocephalic and atraumatic.         Eyes: PERLA, EOMI, Conjunctivae are normal. Sclera is non-icteric.       Mouth/Throat: Mucous membranes are moist.       Neck: Supple with no signs of  meningismus. Cardiovascular: Regular rate and rhythm. No murmurs, gallops, or rubs. 2+ symmetrical distal pulses are present . No JVD. No LE edema Respiratory: Respiratory effort normal .Lungs sounds clear bilaterally. No wheezes, crackles, or rhonchi.  Gastrointestinal: Soft, non tender, and non distended with positive bowel sounds.  Genitourinary: No CVA tenderness. Musculoskeletal: Nontender with normal range of motion in all extremities. No cyanosis, or erythema of extremities. Neurologic:  Face is symmetric. Moving all extremities. No gross focal neurologic deficits . Skin: Skin is warm, dry.  No rash or ulcers Psychiatric: Mood and affect are  normal   Labs on Admission: I have personally reviewed following labs and imaging studies  CBC: Recent Labs  Lab 10/26/20 0924  WBC 8.9  NEUTROABS 5.7  HGB 14.5  HCT 42.0  MCV 88.4  PLT 403   Basic Metabolic Panel: Recent Labs  Lab 10/26/20 0924  NA 136  K 3.7  CL 101  CO2 25  GLUCOSE 133*  BUN 14  CREATININE 0.95  CALCIUM 9.1   GFR: Estimated Creatinine Clearance: 35.6 mL/min (by C-G formula based on SCr of 0.95 mg/dL). Liver Function Tests: Recent Labs  Lab 10/26/20 0924  AST 25  ALT 18  ALKPHOS 60  BILITOT 0.9  PROT 7.2  ALBUMIN 4.1   No results for input(s): LIPASE, AMYLASE in the last 168 hours. No results for input(s): AMMONIA in the last 168 hours. Coagulation Profile: Recent Labs  Lab 10/26/20 0924  INR 1.0   Cardiac Enzymes: No results for input(s): CKTOTAL, CKMB, CKMBINDEX, TROPONINI in the last 168 hours. BNP (last 3 results) No results for input(s): PROBNP in the last 8760 hours. HbA1C: No results for input(s): HGBA1C in the last 72 hours. CBG: No results for input(s): GLUCAP in the last 168 hours. Lipid Profile: No results for input(s): CHOL, HDL, LDLCALC, TRIG, CHOLHDL, LDLDIRECT in the last 72 hours. Thyroid Function Tests: No results for input(s): TSH, T4TOTAL, FREET4, T3FREE, THYROIDAB  in the last 72 hours. Anemia Panel: No results for input(s): VITAMINB12, FOLATE, FERRITIN, TIBC, IRON, RETICCTPCT in the last 72 hours. Urine analysis: No results found for: COLORURINE, APPEARANCEUR, LABSPEC, PHURINE, GLUCOSEU, HGBUR, BILIRUBINUR, KETONESUR, PROTEINUR, UROBILINOGEN, NITRITE, LEUKOCYTESUR  Radiological Exams on Admission: CT HEAD WO CONTRAST  Result Date: 10/26/2020 CLINICAL DATA:  80 year old female with weakness for 3 days. Off balance. EXAM: CT HEAD WITHOUT CONTRAST TECHNIQUE: Contiguous axial images were obtained from the base of the skull through the vertex without intravenous contrast. COMPARISON:  None. FINDINGS: Brain: Cerebral volume is within normal limits for age. No midline shift, ventriculomegaly, mass effect, evidence of mass lesion, intracranial hemorrhage or evidence of cortically based acute infarction. Patchy and confluent bilateral white matter hypodensity. No cortical encephalomalacia identified. Deep gray matter nuclei, brainstem and cerebellum appear within normal limits. Vascular: Calcified atherosclerosis at the skull base. No suspicious intracranial vascular hyperdensity. Skull: Osteopenia.  No acute osseous abnormality identified. Sinuses/Orbits: Trace paranasal sinus mucosal thickening. Tympanic cavities and mastoids are clear. Other: Negative visible orbit and scalp soft tissues. IMPRESSION: 1.  No acute intracranial abnormality. 2. Bilateral cerebral white matter changes most commonly due to chronic small vessel disease. Electronically Signed   By: Genevie Ann M.D.   On: 10/26/2020 09:53   MR BRAIN WO CONTRAST  Result Date: 10/26/2020 CLINICAL DATA:  New right leg weakness EXAM: MRI HEAD WITHOUT CONTRAST TECHNIQUE: Multiplanar, multiecho pulse sequences of the brain and surrounding structures were obtained without intravenous contrast. COMPARISON:  Head CT same day FINDINGS: Brain: Diffusion imaging shows scattered subcentimeter acute infarction in the left frontal  and parietal cortical and subcortical brain consistent with micro embolic infarctions in the left MCA territory. No large confluent infarction. No other acute insult. Mild chronic small-vessel ischemic change affects pons. No focal cerebellar finding. Cerebral hemispheres show moderate chronic small-vessel ischemic changes of the deep and subcortical white matter. Mild chronic small-vessel changes of the thalami and basal ganglia. No mass, hemorrhage, hydrocephalus or extra-axial collection. Vascular: Major vessels at the base of the brain show flow. Skull and upper cervical spine: Negative Sinuses/Orbits: Clear/normal Other: None  IMPRESSION: Several scattered punctate and subcentimeter acute infarctions at the left posterior frontal and parietal vertex affecting the cortical and subcortical brain consistent with micro embolic infarctions in the left MCA territory. Evidence of mass effect or hemorrhage. Background pattern of chronic small vessel ischemic changes elsewhere throughout the brain. Electronically Signed   By: Nelson Chimes M.D.   On: 10/26/2020 12:52     Assessment/Plan Principal Problem:   Acute CVA (cerebrovascular accident) [facility]) Active Problems:   Essential hypertension, benign   GERD     Acute CVA Patient presents for evaluation of right leg weakness, unsteady gait and headache and had an MRI of the brain which shows several scattered punctate and subcentimeter acute infarctions of the left posterior frontal and parietal vertex affecting the cortical and subcortical brain consistent with microembolic infarctions in the left MCA territory. We will place patient on Plavix since she has allergies to aspirin as well as high intensity statins We will allow for permissive hypertension Consult cardiology for possible TEE We will request neurology consult We will consult ST/OT/PT    GERD Continue Protonix   Hypertension Hold benazepril/hydrochlorothiazide   DVT prophylaxis:  SCDs Code Status: full code Family Communication: Greater than 50% of time spent discussing patient's condition and plan of care with her and her husband at the bedside.  All questions and concerns have been addressed.  They verbalize understanding and agree with the plan. Disposition Plan: Back to previous home environment Consults called: Neurology/cardiology Status: At the time of admission, it appears that the appropriate admission status for this patient is inpatient. This is judged to be reasonable and necessary in order to provide the required intensity of service to ensure the patient's safety given the presenting symptoms, physical exam findings and initial radiographic and laboratory data in the context of their comorbid conditions. Patient requires inpatient status due to high intensity of service, high risk for further deterioration and high frequency of surveillance required.    Collier Bullock MD [facility]     10/26/2020, 2:33 PM

## 2020-10-26 NOTE — ED Triage Notes (Signed)
Patient to ER for c/o generalized weakness since waking on Friday. Patient also noticed weakness to right leg and feeling off balance. States she has been dropping things more often "for a while now.". No facial droop noted, equal grips.

## 2020-10-26 NOTE — Consult Note (Signed)
Neurology Consultation Reason for Consult: L MCA territory punctate strokes Requesting Physician: Dr. Royce Macadamia Agbata  CC: Right leg weakness   History is obtained from:   HPI: Angela Bowen is a 80 y.o. female with a past medical history significant for melanoma (stage II, right leg, face, both s/p excision, no radiation), hypertension, hyperlipidemia, osteoporosis, cholecystectomy.  She reports that she was in her usual state of health until she woke up in the morning about 3 days ago and noticed some worsened dragging of her right foot and right leg weakness with gait instability.  She initially chalked this up to her chronic arthritis, but given the stability of the symptoms despite rest she presented to the ED for further evaluation and was found to have small left MCA territory strokes.  She reports she has been fairly active lately moving boxes and things like that as she and her husband moved recently, and her husband is concerned that she was overdoing things.  However she otherwise denies any other focal neurological deficits, any prior right-sided weakness outside of her arthritic pain, or any systemic signs or symptoms.  Regarding her hypertension and hyperlipidemia, these are controlled with diet and exercise.  She notes her physician has also been monitoring her blood glucose and this has not been normal but has not required medications to date.  Regarding her aspirin allergy, she notes that she only had hives when she was taking high doses (12 tablets of 81 mg at a time), and that she has previously been cleared to take 81 mg of aspirin by a physician before, though she has not had a need to do so.  She has never had breathing difficulty with aspirin.  She is comfortable re-trialing this medication today  LKW: 4 days ago in the evening at bedtime tPA given?: No, due to out of the window IA performed?: No, exam not consistent with acute large vessel occlusion Premorbid modified  rankin scale:      0 - No symptoms.  ROS: All other review of systems was negative except as noted in the HPI.  Past Medical History:  Diagnosis Date  . Cancer ([place])    stage II melanoma; right leg;face  . Diverticulosis of colon   . Gallstones   . GERD (gastroesophageal reflux disease)   . History of skin cancer   . Hyperlipidemia   . Hypertension   . Normal stress echocardiogram 01/2006   echo 5/98, fairly normal  . Osteoarthritis   . Osteoporosis, unspecified   . S/P sclerotherapy of varicose veins 07/30/06   left leg, Dr. Eilleen Kempf  . Skin cancer    STAGE II ON RIGHT LEG   Past Surgical History:  Procedure Laterality Date  . CHOLECYSTECTOMY  2001  . FOOT SURGERY     right  . MELANOMA EXCISION  7/10   leg  . MOHS SURGERY    . [place]   bilateral  . VARICOSE VEIN SURGERY  2004   right foot   Current Outpatient Medications  Medication Instructions  . benazepril-hydrochlorthiazide (LOTENSIN HCT) 10-12.5 MG tablet TAKE 1 TABLET DAILY  . EPINEPHrine (EPI-PEN) 0.3 mg, Subcutaneous, As needed  . Multiple Vitamin (MULTIVITAMIN) tablet 1 tablet, Oral, Daily,    . omeprazole (PRILOSEC) 20 MG capsule TAKE 1 CAPSULE TWICE A DAY BEFORE MEALS   Family History  Problem Relation Age of Onset  . Alcohol abuse Mother   . Colon cancer Mother   . Osteoporosis Mother   .  Alcohol abuse Father   . Diabetes Father   . Lung cancer Brother   . Ovarian cancer Maternal Grandmother   . Breast cancer Neg Hx    Social History:  reports that she has never smoked. She has never used smokeless tobacco. She reports current alcohol use. She reports that she does not use drugs.   Exam: Current vital signs: BP 126/83 (BP Location: Right Arm)   Pulse 60   Temp 97.9 F (36.6 C) (Oral)   Resp 17   Ht 5\' 1"  (1.549 m)   Wt 49.9 kg   SpO2 98%   BMI 20.78 kg/m  Vital signs in last 24 hours: Temp:  [97.9 F (36.6 C)] 97.9 F (36.6 C) (05/02 0910) Pulse Rate:  [60-67] 60  (05/02 1320) Resp:  [17-18] 17 (05/02 1320) BP: (126-174)/(64-89) 126/83 (05/02 1347) SpO2:  [97 %-98 %] 98 % (05/02 1320) Weight:  [49.9 kg] 49.9 kg (05/02 0911)   Physical Exam  Constitutional: Appears well-developed and well-nourished, thin but fit.  Psych: Affect appropriate to situation, pleasant and cooperative Eyes: No scleral injection HENT: No oropharyngeal obstruction.  MSK: Some arthritic changes of the bilateral hands, right foot swelling which she reports is chronic secondary to her arthritis Cardiovascular: Normal rate and regular rhythm.  Respiratory: Effort normal, non-labored breathing GI: Soft.  No distension. There is no tenderness.  Skin: Warm dry and intact visible skin  Neuro: Mental Status: Patient is awake, alert, oriented to person, place, month, year, and situation. Patient is able to give a clear and coherent history. No signs of aphasia or neglect Cranial Nerves: II: Visual Fields are full. Pupils are equal, round, and reactive to light.   III,IV, VI: EOMI without ptosis or diploplia.  V: Facial sensation is symmetric to light touch VII: Facial movement is symmetric.  VIII: hearing is intact to voice X: Uvula elevates symmetrically XI: Shoulder shrug is symmetric. XII: tongue is midline without atrophy or fasciculations.  Motor: Tone is normal. Bulk is normal. 5/5 strength was present in all four extremities, other than 4+/5 hip flexion on the left, 4-/5 hip flexion on the right, 4/5 knee flexion on the right, 4+/5 knee extension on the right, 4/5 foot dorsiflexion on the right,  Sensory: Sensation is symmetric to light touch and temperature in the arms and legs. Deep Tendon Reflexes: 2+ and symmetric in the biceps; patellae deferred secondary to significant cramping pain Plantars: Indeterminant, testing caused cramping pain in the bilateral calves Cerebellar: FNF and HKS are intact bilaterally  NIHSS total 1 Score breakdown: 1 point for right  leg drift  I have reviewed labs in epic and the results pertinent to this consultation are: Creatinine 0.9, glucose 133, CMP otherwise unremarkable, CBC unremarkable,  I have reviewed the images obtained: Head CT negative for acute intracranial process MRI brain personally reviewed, punctate areas of diffusion restriction in the left MCA territory particularly near the motor strip likely explaining her right leg weakness  Impression: Microembolic pattern of strokes in the left MCA territory concerning for potential left carotid atheroembolic disease, central cardioembolic source cannot be ruled out at this time.  Patient needs admission for stroke work-up  Recommendations: # Multifocal small strokes in the left MCA territory - Stroke labs TSH, HgbA1c, fasting lipid panel - MRI brain  - CTA head and neck  - Frequent neuro checks - Echocardiogram - Aspirin 81 mg daily, skipping loading dose given her history of hives on higher doses of aspirin - Plavix 300  mg load with 75 mg daily for 21 - 90 day course  - Risk factor modification - Telemetry monitoring; 30 day event monitor on discharge if no arrythmias captured  - Blood pressure goal   - Normotension given patient's exam is stable with a blood pressure of 126/83 on last check and given it has been more than 24 hours since symptom onset - PT consult, OT consult, - Neurology to follow  [place] [phone] [facility] coverage for [facility] is from 8 AM to 4 AM in-house and 4 PM to 8 PM by telephone/video. 8 PM to 8 AM emergent questions or overnight urgent questions should be addressed to Teleneurology On-call or [facility] neurohospitalist; contact information can be found on AMION

## 2020-10-26 NOTE — ED Notes (Signed)
Patient transported to MRI 

## 2020-10-26 NOTE — ED Provider Notes (Signed)
[facility] Emergency Department Provider Note ____________________________________________   Event Date/Time   First MD Initiated Contact with Patient 10/26/20 1111     (approximate)  I have reviewed the triage vital signs and the nursing notes.  HISTORY  Chief Complaint Weakness   HPI Angela Bowen Angela Bowen is a 80 y.o. femalewho presents to the ED for evaluation of generalized weakness.  Chart review indicates history of HTN, HLD.  Patient presents to the ED via POV with her husband, who provides some additional history, for evaluation of new weakness and gait change.  They report for the past 3-4 days her gait has been different, she has felt weak in a generalized fashion, as well as primarily weakness to her right leg.  She reports a mild aching headache, which has been taking Tylenol with some mild improvement.    Denies photophobia, falls or injuries, syncopal episodes, fever, shortness of breath.  This has never happened before.  Her symptoms have been constant for the past 4 days.  Past Medical History:  Diagnosis Date  . Cancer ([place])    stage II melanoma; right leg;face  . Diverticulosis of colon   . Gallstones   . GERD (gastroesophageal reflux disease)   . History of skin cancer   . Hyperlipidemia   . Hypertension   . Normal stress echocardiogram 01/2006   echo 5/98, fairly normal  . Osteoarthritis   . Osteoporosis, unspecified   . S/P sclerotherapy of varicose veins 07/30/06   left leg, Dr. Eilleen Kempf  . Skin cancer    STAGE II ON RIGHT LEG    Patient Active Problem List   Diagnosis Date Noted  . Acute CVA (cerebrovascular accident) ([place]) 10/26/2020  . Right foot pain 01/05/2020  . Osteoarthritis 07/30/2019  . Allergy to alpha-gal 12/25/2018  . Advance directive discussed with patient 11/14/2014  . Chest pain 01/30/2012  . Osteoporosis   . Routine general medical examination at a health care facility 12/31/2010  . GERD 10/17/2008  .  Essential hypertension, benign 02/27/2007  . DIVERTICULOSIS, COLON 02/27/2007  . Osteoarthritis, generalized 02/27/2007  . SKIN CANCER, HX OF 02/27/2007    Past Surgical History:  Procedure Laterality Date  . CHOLECYSTECTOMY  2001  . FOOT SURGERY     right  . MELANOMA EXCISION  7/10   leg  . MOHS SURGERY    . [place]   bilateral  . VARICOSE VEIN SURGERY  2004   right foot    Prior to Admission medications   Medication Sig Start Date End Date Taking? Authorizing Provider  benazepril-hydrochlorthiazide (LOTENSIN HCT) 10-12.5 MG tablet TAKE 1 TABLET DAILY 04/30/20  Yes Venia Carbon, MD  Multiple Vitamin (MULTIVITAMIN) tablet Take 1 tablet by mouth daily.   Yes [provider]  omeprazole (PRILOSEC) 20 MG capsule TAKE 1 CAPSULE TWICE A DAY BEFORE MEALS 08/17/20  Yes Viviana Simpler I, MD  EPINEPHrine 0.3 mg/0.3 mL IJ SOAJ injection Inject 0.3 mLs (0.3 mg total) into the skin as needed. 12/27/19   Venia Carbon, MD  Calcium-Magnesium (CALCIUM MAGNESIUM 750) 300-300 MG TABS Take by mouth daily.   09/16/11  [provider]    Allergies Meat [alpha-gal], Nsaids, Aspirin, and Codeine  Family History  Problem Relation Age of Onset  . Alcohol abuse Mother   . Colon cancer Mother   . Osteoporosis Mother   . Alcohol abuse Father   . Diabetes Father   . Lung cancer Brother   .  Ovarian cancer Maternal Grandmother   . Breast cancer Neg Hx     Social History Social History   Tobacco Use  . Smoking status: Never Smoker  . Smokeless tobacco: Never Used  Substance Use Topics  . Alcohol use: Yes    Alcohol/week: 0.0 standard drinks    Comment: Wine, rarely  . Drug use: Never    Review of Systems  Constitutional: No fever/chills Eyes: No visual changes. ENT: No sore throat. Cardiovascular: Denies chest pain. Respiratory: Denies shortness of breath. Gastrointestinal: No abdominal pain.  No nausea, no vomiting.  No diarrhea.  No  constipation. Genitourinary: Negative for dysuria. Musculoskeletal: Negative for back pain. Skin: Negative for rash. Neurological: Negative for headaches,or numbness.  New right leg weakness  ____________________________________________   PHYSICAL EXAM:  VITAL SIGNS: Vitals:   10/26/20 1320 10/26/20 1347  BP: (!) 167/89 126/83  Pulse: 60   Resp: 17   Temp:    SpO2: 98%     Constitutional: Alert and oriented. Well appearing and in no acute distress. Eyes: Conjunctivae are normal. PERRL. EOMI. Head: Atraumatic. Nose: No congestion/rhinnorhea. Mouth/Throat: Mucous membranes are moist.  Oropharynx non-erythematous. Neck: No stridor. No cervical spine tenderness to palpation. Cardiovascular: Normal rate, regular rhythm. Grossly normal heart sounds.  Good peripheral circulation. Respiratory: Normal respiratory effort.  No retractions. Lungs CTAB. Gastrointestinal: Soft , nondistended, nontender to palpation. No CVA tenderness. Musculoskeletal: No lower extremity tenderness nor edema.  No joint effusions. No signs of acute trauma. Neurologic:  Normal speech and language.  Cranial nerves II through XII intact 4/5 strength to right lower extremity on leg raise and mild dysmetria on heel-to-shin testing on the right leg.  Otherwise strength and sensation intact throughout Perhaps some mild dysmetria on finger-nose-finger on the right Skin:  Skin is warm, dry and intact. No rash noted. Psychiatric: Mood and affect are normal. Speech and behavior are normal. ____________________________________________   LABS (all labs ordered are listed, but only abnormal results are displayed)  Labs Reviewed  DIFFERENTIAL - Abnormal; Notable for the following components:      Result Value   Basophils Absolute 0.2 (*)    All other components within normal limits  COMPREHENSIVE METABOLIC PANEL - Abnormal; Notable for the following components:   Glucose, Bld 133 (*)    All other components within  normal limits  RESP PANEL BY RT-PCR (FLU A&B, COVID) ARPGX2  PROTIME-INR  APTT  CBC   ____________________________________________  12 Lead EKG  Sinus rhythm, rate of 67 bpm.  Normal axis and intervals.  No evidence of acute ischemia. ____________________________________________  RADIOLOGY  ED MD interpretation: CT head reviewed by me without evidence of acute intracranial pathology.  Official radiology report(s): CT HEAD WO CONTRAST  Result Date: 10/26/2020 CLINICAL DATA:  80 year old female with weakness for 3 days. Off balance. EXAM: CT HEAD WITHOUT CONTRAST TECHNIQUE: Contiguous axial images were obtained from the base of the skull through the vertex without intravenous contrast. COMPARISON:  None. FINDINGS: Brain: Cerebral volume is within normal limits for age. No midline shift, ventriculomegaly, mass effect, evidence of mass lesion, intracranial hemorrhage or evidence of cortically based acute infarction. Patchy and confluent bilateral white matter hypodensity. No cortical encephalomalacia identified. Deep gray matter nuclei, brainstem and cerebellum appear within normal limits. Vascular: Calcified atherosclerosis at the skull base. No suspicious intracranial vascular hyperdensity. Skull: Osteopenia.  No acute osseous abnormality identified. Sinuses/Orbits: Trace paranasal sinus mucosal thickening. Tympanic cavities and mastoids are clear. Other: Negative visible orbit and scalp soft tissues.  IMPRESSION: 1.  No acute intracranial abnormality. 2. Bilateral cerebral white matter changes most commonly due to chronic small vessel disease. Electronically Signed   By: Genevie Ann M.D.   On: 10/26/2020 09:53   MR BRAIN WO CONTRAST  Result Date: 10/26/2020 CLINICAL DATA:  New right leg weakness EXAM: MRI HEAD WITHOUT CONTRAST TECHNIQUE: Multiplanar, multiecho pulse sequences of the brain and surrounding structures were obtained without intravenous contrast. COMPARISON:  Head CT same day FINDINGS:  Brain: Diffusion imaging shows scattered subcentimeter acute infarction in the left frontal and parietal cortical and subcortical brain consistent with micro embolic infarctions in the left MCA territory. No large confluent infarction. No other acute insult. Mild chronic small-vessel ischemic change affects pons. No focal cerebellar finding. Cerebral hemispheres show moderate chronic small-vessel ischemic changes of the deep and subcortical white matter. Mild chronic small-vessel changes of the thalami and basal ganglia. No mass, hemorrhage, hydrocephalus or extra-axial collection. Vascular: Major vessels at the base of the brain show flow. Skull and upper cervical spine: Negative Sinuses/Orbits: Clear/normal Other: None IMPRESSION: Several scattered punctate and subcentimeter acute infarctions at the left posterior frontal and parietal vertex affecting the cortical and subcortical brain consistent with micro embolic infarctions in the left MCA territory. Evidence of mass effect or hemorrhage. Background pattern of chronic small vessel ischemic changes elsewhere throughout the brain. Electronically Signed   By: Nelson Chimes M.D.   On: 10/26/2020 12:52    ____________________________________________   PROCEDURES and INTERVENTIONS  Procedure(s) performed (including Critical Care):  .1-3 Lead EKG Interpretation Performed by: Vladimir Crofts, MD Authorized by: Vladimir Crofts, MD     Interpretation: normal     ECG rate:  68   ECG rate assessment: normal     Rhythm: sinus rhythm     Ectopy: none     Conduction: normal      Medications  LORazepam (ATIVAN) tablet 1 mg (1 mg Oral Given 10/26/20 1209)  pantoprazole (PROTONIX) EC tablet 40 mg (has no administration in time range)  multivitamin with minerals tablet 1 tablet (has no administration in time range)   stroke: mapping our early stages of recovery book (has no administration in time range)  acetaminophen (TYLENOL) tablet 650 mg (has no  administration in time range)    Or  acetaminophen (TYLENOL) 160 MG/5ML solution 650 mg (has no administration in time range)    Or  acetaminophen (TYLENOL) suppository 650 mg (has no administration in time range)  senna-docusate (Senokot-S) tablet 1 tablet (has no administration in time range)  clopidogrel (PLAVIX) tablet 75 mg (75 mg Oral Given 10/26/20 1508)  atorvastatin (LIPITOR) tablet 40 mg (40 mg Oral Not Given 10/26/20 1456)  butalbital-acetaminophen-caffeine (FIORICET) 50-325-40 MG per tablet 2 tablet (2 tablets Oral Given 10/26/20 1209)    ____________________________________________   MDM / ED COURSE   Pleasant 80 year old woman presents to the ED with 4 days of new right leg weakness and gait change with evidence of an acute CVA with primary cognition.  Minimal prostatic hypertension that we will allow, otherwise normal vitals.  Exam with minimal weakness to her right leg and dysmetria to the right side that appears new for her, giving NIH of about 2.  She otherwise looks well without evidence of trauma, vascular deficits.  Blood work is unremarkable.  CT head unremarkable without evidence of ICH.  Considering her new deficits and discrete gait change, MRI brain obtained and demonstrates acute infarcts.  We will admit to hospitalist for stroke work-up  ____________________________________________   FINAL CLINICAL IMPRESSION(S) / ED DIAGNOSES  Final diagnoses:  Right leg weakness  Cerebrovascular accident (CVA) due to other mechanism [facility])     ED Discharge Orders    None       Jasmyne Lodato   Note:  This document was prepared using Dragon voice recognition software and may include unintentional dictation errors.   Vladimir Crofts, MD 10/26/20 [phone]

## 2020-10-27 ENCOUNTER — Inpatient Hospital Stay (HOSPITAL_COMMUNITY)
Admit: 2020-10-27 | Discharge: 2020-10-27 | Disposition: A | Discharge status: Home / Self Care | Payer: Medicare Other | Attending: Internal Medicine | Admitting: Internal Medicine

## 2020-10-27 DIAGNOSIS — K219 Gastro-esophageal reflux disease without esophagitis: Secondary | ICD-10-CM

## 2020-10-27 DIAGNOSIS — I6522 Occlusion and stenosis of left carotid artery: Secondary | ICD-10-CM

## 2020-10-27 DIAGNOSIS — I6389 Other cerebral infarction: Secondary | ICD-10-CM | POA: Diagnosis not present

## 2020-10-27 LAB — LIPID PANEL
Cholesterol: 156 mg/dL (ref 0–200)
HDL: 34 mg/dL — ABNORMAL LOW (ref >40)
LDL Cholesterol: 103 mg/dL — ABNORMAL HIGH (ref 0–99)
Total CHOL/HDL Ratio: 4.6 RATIO
Triglycerides: 96 mg/dL (ref <150)
VLDL: 19 mg/dL (ref 0–40)

## 2020-10-27 LAB — ECHOCARDIOGRAM COMPLETE
AR max vel: 1.55 cm2
AV Area VTI: 1.59 cm2
AV Area mean vel: 1.37 cm2
AV Mean grad: 5 mmHg
AV Peak grad: 7.4 mmHg
Ao pk vel: 1.36 m/s
Area-P 1/2: 2.77 cm2
Height: 61 in
MV VTI: 2.37 cm2
S' Lateral: 2.5 cm
Weight: 1760 oz

## 2020-10-27 LAB — BASIC METABOLIC PANEL
Anion gap: 9 (ref 5–15)
BUN: 15 mg/dL (ref 8–23)
CO2: 25 mmol/L (ref 22–32)
Calcium: 8.4 mg/dL — ABNORMAL LOW (ref 8.9–10.3)
Chloride: 100 mmol/L (ref 98–111)
Creatinine, Ser: 0.84 mg/dL (ref 0.44–1.00)
GFR, Estimated: >60 mL/min (ref >60)
Glucose, Bld: 90 mg/dL (ref 70–99)
Potassium: 3.2 mmol/L — ABNORMAL LOW (ref 3.5–5.1)
Sodium: 134 mmol/L — ABNORMAL LOW (ref 135–145)

## 2020-10-27 LAB — TSH: TSH: 3.375 u[IU]/mL (ref 0.350–4.500)

## 2020-10-27 LAB — HEMOGLOBIN A1C
Hgb A1c MFr Bld: 5.9 % — ABNORMAL HIGH (ref 4.8–5.6)
Mean Plasma Glucose: 122.63 mg/dL

## 2020-10-27 MED ORDER — TRAZODONE HCL 50 MG PO TABS
25.0000 mg | ORAL_TABLET | Freq: Every evening | ORAL | Status: DC | PRN
  Administered 2020-10-27: 22:00:00 25 mg via ORAL
  Filled 2020-10-27: qty 1

## 2020-10-27 MED ORDER — SODIUM CHLORIDE 0.9 % IV SOLN: INTRAVENOUS | Status: AC

## 2020-10-27 MED ORDER — MAGNESIUM SULFATE 2 GM/50ML IV SOLN
2.0000 g | Freq: Once | INTRAVENOUS | Status: AC
  Administered 2020-10-28: 2 g via INTRAVENOUS
  Filled 2020-10-27: qty 50

## 2020-10-27 MED ORDER — POTASSIUM CHLORIDE CRYS ER 20 MEQ PO TBCR
40.0000 meq | EXTENDED_RELEASE_TABLET | ORAL | Status: AC
  Administered 2020-10-27 (×2): 40 meq via ORAL
  Filled 2020-10-27: qty 2

## 2020-10-27 MED ORDER — ATORVASTATIN CALCIUM 20 MG PO TABS
40.0000 mg | ORAL_TABLET | Freq: Every day | ORAL | Status: DC
  Administered 2020-10-27: 40 mg via ORAL
  Filled 2020-10-27: qty 2

## 2020-10-27 NOTE — Evaluation (Signed)
Physical Therapy Evaluation Patient Details Name: Angela Bowen MRN: [id number] DOB: 1940/12/03 Today's Date: 10/27/2020   History of Present Illness  Angela Bowen is a 80 y.o. female with medical history significant for hypertension, GERD, diverticulosis, osteoporosis and dyslipidemia who presents to the emergency room via private vehicle for evaluation of right leg weakness which she has had for about 3 days.  Patient was in her usual state of health until 3 days ago when she noticed that she was dragging her right leg and had to hold onto surfaces to prevent her from falling.  Her husband notes that her gait has been unsteady.  She also complains of a frontal headache which she rates a 5 x 10 in intensity at its worst.  Per patient she does not usually get headaches. MRI reveals punctate areas of diffusion restriction in the left MCA territory particularly near the motor strip likely explaining her right leg weakness.    Clinical Impression  Patient received in bed, she is agreeable to PT assessment. Patient is mod independent with bed mobility and sit to stand transfer. For ambulation she requires min guard with and without AD. Patient was able to ambulate 150 feet without ad, but then got up again to walk to bathroom and had increased right knee buckling requiring RW assistance. Patient will continue to benefit from skilled PT while here to improve safety with mobility and strengthening.     Follow Up Recommendations Outpatient PT;Supervision - Intermittent    Equipment Recommendations  Rolling walker with 5" wheels    Recommendations for Other Services       Precautions / Restrictions Precautions Precautions: Fall Restrictions Weight Bearing Restrictions: No      Mobility  Bed Mobility Overal bed mobility: Modified Independent                  Transfers Overall transfer level: Needs assistance Equipment used: None Transfers: Sit to/from Stand Sit to Stand: Min  guard            Ambulation/Gait Ambulation/Gait assistance: Min guard Gait Distance (Feet): 150 Feet Assistive device: None Gait Pattern/deviations: Step-through pattern;Decreased stance time - right;Decreased weight shift to right;Decreased dorsiflexion - right Gait velocity: decreased   General Gait Details: patient demonstrates decreased DF on right and occasional knee buckling on right with ambulation. Patient ambulated in room with RW which obviously provided more support.  Stairs            Wheelchair Mobility    Modified Rankin (Stroke Patients Only)       Balance Overall balance assessment: Needs assistance Sitting-balance support: Feet supported Sitting balance-Leahy Scale: Normal     Standing balance support: Bilateral upper extremity supported;During functional activity;No upper extremity supported Standing balance-Leahy Scale: Fair Standing balance comment: without UE support patient has slight imbalance, but then she got up again to use bathroom and had knee buckling on right. Requiring increased assistance and use of walker                             Pertinent Vitals/Pain Pain Assessment: No/denies pain    Home Living Family/patient expects to be discharged to:: Private residence Living Arrangements: Spouse/significant other Available Help at Discharge: Family;Available 24 hours/day Type of Home: House Home Access: Stairs to enter     Home Layout: One level Home Equipment: None      Prior Function Level of Independence: Independent  Comments: patient and husband recently moved into new home. Patient was fully independent prior to admission     Hand Dominance        Extremity/Trunk Assessment   Upper Extremity Assessment Upper Extremity Assessment: Defer to OT evaluation    Lower Extremity Assessment Lower Extremity Assessment: RLE deficits/detail RLE Sensation: WNL RLE Coordination: decreased gross  motor    Cervical / Trunk Assessment Cervical / Trunk Assessment: Normal  Communication   Communication: No difficulties  Cognition Arousal/Alertness: Awake/alert Behavior During Therapy: WFL for tasks assessed/performed Overall Cognitive Status: Within Functional Limits for tasks assessed                                        General Comments      Exercises Total Joint Exercises Ankle Circles/Pumps: AROM;10 reps;Both   Assessment/Plan    PT Assessment Patient needs continued PT services  PT Problem List Decreased strength;Decreased mobility;Decreased balance;Decreased knowledge of use of DME       PT Treatment Interventions DME instruction;Therapeutic exercise;Gait training;Functional mobility training;Therapeutic activities;Patient/family education;Stair training;Balance training;Neuromuscular re-education    PT Goals (Current goals can be found in the Care Plan section)  Acute Rehab PT Goals Patient Stated Goal: to get better, return home PT Goal Formulation: With patient Time For Goal Achievement: 10/31/20 Potential to Achieve Goals: Good    Frequency 7X/week   Barriers to discharge        Co-evaluation               AM-PAC PT "6 Clicks" Mobility  Outcome Measure Help needed turning from your back to your side while in a flat bed without using bedrails?: None Help needed moving from lying on your back to sitting on the side of a flat bed without using bedrails?: None Help needed moving to and from a bed to a chair (including a wheelchair)?: A Little Help needed standing up from a chair using your arms (e.g., wheelchair or bedside chair)?: A Little Help needed to walk in hospital room?: A Little Help needed climbing 3-5 steps with a railing? : A Little 6 Click Score: 20    End of Session Equipment Utilized During Treatment: Gait belt Activity Tolerance: Patient tolerated treatment well Patient left: in bed;with call bell/phone within  reach;with bed alarm set Nurse Communication: Mobility status PT Visit Diagnosis: Unsteadiness on feet (R26.81);Muscle weakness (generalized) (M62.81);Difficulty in walking, not elsewhere classified (R26.2)    Time: 9485-4627 PT Time Calculation (min) (ACUTE ONLY): 23 min   Charges:   PT Evaluation $PT Eval Moderate Complexity: 1 Mod PT Treatments $Gait Training: 8-22 mins        Isela Stantz, PT, GCS 10/27/20,10:37 AM

## 2020-10-27 NOTE — Progress Notes (Signed)
9-10 sec run of multifocal atrial tachycardia. Pt denies chest pain, SOB or palpitations. Currently in sinus rhythm. Notified Mansy, MD - orders to replace magnesium, see eMAR.

## 2020-10-27 NOTE — Progress Notes (Signed)
*  PRELIMINARY RESULTS* Echocardiogram 2D Echocardiogram has been performed.  Angela Bowen 10/27/2020, 10:37 AM

## 2020-10-27 NOTE — Consult Note (Signed)
[facility] Consultation Note  Patient ID: Angela Bowen, MRN: [id number], DOB/AGE: 1940/10/11 80 y.o. Admit date: 10/26/2020   Date of Consult: 10/27/2020 Primary Physician: Venia Carbon, MD Primary Cardiologist: None  Chief Complaint:  Chief Complaint  Patient presents with  . Weakness   Reason for Consult: Stroke  HPI: 80 y.o. female with known hypertension hyperlipidemia who has had appropriate medication management for her hypertension.  The patient claims that overall she is done very well with moving boxes and working in her house with no evidence of PND orthopnea syncope dizziness nausea or diaphoresis.  She had a significant new onset of right foot drop and tingling of her right lower leg for which the patient was seen in the emergency room.  After further work-up it appears that the patient does have embolic strokes in the middle cerebral vascular distribution.  Further angiogram shows left subclavian stenosis as well as left internal carotid stenosis possibly consistent with her symptoms.  We also have discussed at length the possibility of further concerns of valvular and/or aortic arch atherosclerosis causing her current issues.  We have discussed at length further evaluation and treatment options including the possibility of transesophageal echocardiogram to evaluate this more closely.  Additionally we have talked medication management for the high intensity cholesterol therapy and dual antiplatelet therapy  Past Medical History:  Diagnosis Date  . Cancer ([place])    stage II melanoma; right leg;face  . Diverticulosis of colon   . Gallstones   . GERD (gastroesophageal reflux disease)   . History of skin cancer   . Hyperlipidemia   . Hypertension   . Normal stress echocardiogram 01/2006   echo 5/98, fairly normal  . Osteoarthritis   . Osteoporosis, unspecified   . S/P sclerotherapy of varicose veins 07/30/06   left leg, Dr. Eilleen Kempf  . Skin cancer    STAGE II  ON RIGHT LEG      Surgical History:  Past Surgical History:  Procedure Laterality Date  . CHOLECYSTECTOMY  2001  . FOOT SURGERY     right  . MELANOMA EXCISION  7/10   leg  . MOHS SURGERY    . [place]   bilateral  . VARICOSE VEIN SURGERY  2004   right foot     Home Meds: Prior to Admission medications   Medication Sig Start Date End Date Taking? Authorizing Provider  benazepril-hydrochlorthiazide (LOTENSIN HCT) 10-12.5 MG tablet TAKE 1 TABLET DAILY 04/30/20  Yes Venia Carbon, MD  Multiple Vitamin (MULTIVITAMIN) tablet Take 1 tablet by mouth daily.   Yes [provider]  omeprazole (PRILOSEC) 20 MG capsule TAKE 1 CAPSULE TWICE A DAY BEFORE MEALS 08/17/20  Yes Viviana Simpler I, MD  EPINEPHrine 0.3 mg/0.3 mL IJ SOAJ injection Inject 0.3 mLs (0.3 mg total) into the skin as needed. 12/27/19   Venia Carbon, MD  Calcium-Magnesium (CALCIUM MAGNESIUM 750) 300-300 MG TABS Take by mouth daily.   09/16/11  [provider]    Inpatient Medications:  . aspirin EC  81 mg Oral Daily  . atorvastatin  40 mg Oral QHS  . clopidogrel  75 mg Oral Daily  . multivitamin with minerals  1 tablet Oral Daily  . pantoprazole  40 mg Oral QAC breakfast  . potassium chloride  40 mEq Oral Q2H   . sodium chloride 75 mL/hr at 10/27/20 1052    Allergies:  Allergies  Allergen Reactions  . Meat [Alpha-Gal]  RED MEAT-CAUSES SEVERE HIVES,SOB  . Aspirin     High doses - intolerant/causes hives only at high doses, tolerates 81 mg daily well  . Codeine     REACTION: rash    Social History   Socioeconomic History  . Marital status: Married    Spouse name: Not on file  . Number of children: 2  . Years of education: Not on file  . Highest education level: Not on file  Occupational History  . Occupation: homemaker  Tobacco Use  . Smoking status: Never Smoker  . Smokeless tobacco: Never Used  Substance and Sexual Activity  . Alcohol use: Yes     Alcohol/week: 0.0 standard drinks    Comment: Wine, rarely  . Drug use: Never  . Sexual activity: Never  Other Topics Concern  . Not on file  Social History Narrative   Has living will   No designated health care POA--requests husband. Alternate would be son [name]   Would accept resuscitation attempts   No tube feeds if cognitively unaware   Social Determinants of Health   Financial Resource Strain: Not on file  Food Insecurity: Not on file  Transportation Needs: Not on file  Physical Activity: Not on file  Stress: Not on file  Social Connections: Not on file  Intimate Partner Violence: Not on file     Family History  Problem Relation Age of Onset  . Alcohol abuse Mother   . Colon cancer Mother   . Osteoporosis Mother   . Alcohol abuse Father   . Diabetes Father   . Lung cancer Brother   . Ovarian cancer Maternal Grandmother   . Breast cancer Neg Hx      Review of Systems Positive for stroke Negative for: General:  chills, fever, night sweats or weight changes.  Cardiovascular: PND orthopnea syncope dizziness  Dermatological skin lesions rashes Respiratory: Cough congestion Urologic: Frequent urination urination at night and hematuria Abdominal: negative for nausea, vomiting, diarrhea, bright red blood per rectum, melena, or hematemesis Neurologic: negative for visual changes, and/or hearing changes  All other systems reviewed and are otherwise negative except as noted above.  Labs: No results for input(s): CKTOTAL, CKMB, TROPONINI in the last 72 hours. Lab Results  Component Value Date   WBC 8.9 10/26/2020   HGB 14.5 10/26/2020   HCT 42.0 10/26/2020   MCV 88.4 10/26/2020   PLT 331 10/26/2020    Recent Labs  Lab 10/26/20 0924 10/27/20 0400  NA 136 134*  K 3.7 3.2*  CL 101 100  CO2 25 25  BUN 14 15  CREATININE 0.95 0.84  CALCIUM 9.1 8.4*  PROT 7.2  --   BILITOT 0.9  --   ALKPHOS 60  --   ALT 18  --   AST 25  --   GLUCOSE 133* 90   Lab  Results  Component Value Date   CHOL 156 10/27/2020   HDL 34 (L) 10/27/2020   LDLCALC 103 (H) 10/27/2020   TRIG 96 10/27/2020   No results found for: DDIMER  Radiology/Studies:  CT HEAD WO CONTRAST  Result Date: 10/26/2020 CLINICAL DATA:  80 year old female with weakness for 3 days. Off balance. EXAM: CT HEAD WITHOUT CONTRAST TECHNIQUE: Contiguous axial images were obtained from the base of the skull through the vertex without intravenous contrast. COMPARISON:  None. FINDINGS: Brain: Cerebral volume is within normal limits for age. No midline shift, ventriculomegaly, mass effect, evidence of mass lesion, intracranial hemorrhage or evidence of cortically based  acute infarction. Patchy and confluent bilateral white matter hypodensity. No cortical encephalomalacia identified. Deep gray matter nuclei, brainstem and cerebellum appear within normal limits. Vascular: Calcified atherosclerosis at the skull base. No suspicious intracranial vascular hyperdensity. Skull: Osteopenia.  No acute osseous abnormality identified. Sinuses/Orbits: Trace paranasal sinus mucosal thickening. Tympanic cavities and mastoids are clear. Other: Negative visible orbit and scalp soft tissues. IMPRESSION: 1.  No acute intracranial abnormality. 2. Bilateral cerebral white matter changes most commonly due to chronic small vessel disease. Electronically Signed   By: Genevie Ann M.D.   On: 10/26/2020 09:53   MR BRAIN WO CONTRAST  Result Date: 10/26/2020 CLINICAL DATA:  New right leg weakness EXAM: MRI HEAD WITHOUT CONTRAST TECHNIQUE: Multiplanar, multiecho pulse sequences of the brain and surrounding structures were obtained without intravenous contrast. COMPARISON:  Head CT same day FINDINGS: Brain: Diffusion imaging shows scattered subcentimeter acute infarction in the left frontal and parietal cortical and subcortical brain consistent with micro embolic infarctions in the left MCA territory. No large confluent infarction. No other acute  insult. Mild chronic small-vessel ischemic change affects pons. No focal cerebellar finding. Cerebral hemispheres show moderate chronic small-vessel ischemic changes of the deep and subcortical white matter. Mild chronic small-vessel changes of the thalami and basal ganglia. No mass, hemorrhage, hydrocephalus or extra-axial collection. Vascular: Major vessels at the base of the brain show flow. Skull and upper cervical spine: Negative Sinuses/Orbits: Clear/normal Other: None IMPRESSION: Several scattered punctate and subcentimeter acute infarctions at the left posterior frontal and parietal vertex affecting the cortical and subcortical brain consistent with micro embolic infarctions in the left MCA territory. Evidence of mass effect or hemorrhage. Background pattern of chronic small vessel ischemic changes elsewhere throughout the brain. Electronically Signed   By: Nelson Chimes M.D.   On: 10/26/2020 12:52   CT ANGIO HEAD NECK W WO CM (CODE STROKE)  Result Date: 10/26/2020 CLINICAL DATA:  Weakness of the right foot and leg beginning about 3 days ago. Acute infarctions in the left MCA territory by MRI. EXAM: CT ANGIOGRAPHY HEAD AND NECK TECHNIQUE: Multidetector CT imaging of the head and neck was performed using the standard protocol during bolus administration of intravenous contrast. Multiplanar CT image reconstructions and MIPs were obtained to evaluate the vascular anatomy. Carotid stenosis measurements (when applicable) are obtained utilizing NASCET criteria, using the distal internal carotid diameter as the denominator. CONTRAST:  42mL OMNIPAQUE IOHEXOL 350 MG/ML SOLN COMPARISON:  MRI and CT earlier same day. FINDINGS: CTA NECK FINDINGS Aortic arch: Aortic atherosclerotic calcification. Branching pattern is normal. Severe stenosis of the proximal left subclavian artery because of calcified plaque, 80% or greater. Right carotid system: Common carotid artery shows scattered calcified plaque but is sufficiently  patent to the bifurcation region. No soft or calcified plaque at the carotid bifurcation or ICA bulb. Cervical ICA widely patent. Left carotid system: Common carotid artery shows scattered plaque but is sufficiently patent to the bifurcation. Calcified plaque at the carotid bifurcation and ICA bulb. Minimal diameter in the ICA bulb 1 mm. Compared to a more distal cervical ICA diameter of 4 mm, which may actually be flow reduced, this indicates of stenosis of at least 75%. Cervical ICA widely patent beyond that. Vertebral arteries: 30% narrowing at the right vertebral artery origin. Beyond that, the right vertebral artery is widely patent to the foramen magnum. As noted above, there is severe stenosis of the proximal left subclavian artery just beyond its origin from the arch. There is 30% narrowing at the non  dominant left vertebral artery origin. Focal calcified plaque 2 cm beyond the origin results in 30% stenosis. Beyond that, the vessels show some scattered plaque but is sufficiently patent to the foramen magnum. Skeleton: Mid cervical spondylosis.  Bilateral facet osteoarthritis. Other neck: No lymphadenopathy. Enlarged heterogeneous thyroid gland. Largest nodule on the left measures up to 3 cm in diameter. Upper chest: Negative Review of the MIP images confirms the above findings CTA HEAD FINDINGS Anterior circulation: Both internal carotid arteries are patent through the skull base and siphon regions. Ordinary siphon atherosclerotic calcification but without stenosis greater than 30%. The anterior and middle cerebral vessels are patent. No large or medium vessel occlusion. No aneurysm or vascular malformation. Posterior circulation: Both vertebral arteries are patent through the foramen magnum to the basilar. No basilar stenosis. Posterior circulation branch vessels are patent. No significant proximal stenosis. Venous sinuses: Patent and normal. Anatomic variants: None other significant. Review of the MIP  images confirms the above findings IMPRESSION: Aortic atherosclerosis. Severe stenosis of the proximal left subclavian artery, 80% or greater. CT angiography is not able to establish flow direction in the left vertebral artery and subclavian steal is not excluded. Doppler ultrasound could accomplish that. No carotid bifurcation stenosis on the right. Advanced atherosclerotic change at the left ICA origin and bulb. Minimal diameter is 1 mm. This represents at least a 75% stenosis. 30% stenosis of both vertebral artery origins. Second 30% stenosis of the left vertebral artery 2 cm beyond the origin. No intracranial large or medium vessel occlusion. No left MCA territory abnormality visible by CT angiography. Enlarged heterogeneous thyroid with multiple nodules, the largest measuring up to 3 cm on the left. Recommend thyroid US (ref: J Am Coll Radiol. 2015 Feb;12(2): 143-50). Electronically Signed   By: Nelson Chimes M.D.   On: 10/26/2020 16:53    EKG: nsr   Weights: Filed Weights   10/26/20 0911  Weight: 49.9 kg     Physical Exam: Blood pressure (!) 118/45, pulse (!) 53, temperature 97.9 F (36.6 C), resp. rate 18, height 5\' 1"  (1.549 m), weight 49.9 kg, SpO2 97 %. Body mass index is 20.78 kg/m. General: Well developed, well nourished, in no acute distress. Head eyes ears nose throat: Normocephalic, atraumatic, sclera non-icteric, no xanthomas, nares are without discharge. No apparent thyromegaly and/or mass  Lungs: Normal respiratory effort.  no wheezes, no rales, no rhonchi.  Heart: RRR with normal S1 S2. no murmur gallop, no rub, PMI is normal size and placement, carotid upstroke normal with  bruit, jugular venous pressure is normal Abdomen: Soft, non-tender, non-distended with normoactive bowel sounds. No hepatomegaly. No rebound/guarding. No obvious abdominal masses. Abdominal aorta is normal size without bruit Extremities: No edema. no cyanosis, no clubbing, no ulcers  Peripheral : 2+  bilateral upper extremity pulses, 2+ bilateral femoral pulses, 2+ bilateral dorsal pedal pulse Neuro: Alert and oriented. No facial asymmetry. No focal deficit. Moves all extremities spontaneously. Musculoskeletal: Normal muscle tone without kyphosis Psych:  Responds to questions appropriately with a normal affect.    Assessment: 80 year old female with hypertension hyperlipidemia peripheral vascular disease with middle cerebral infarct and possible embolic phenomenon coming from multiple sources including left carotid artery atherosclerosis and/or aortic arch and/or aortic valve disease possibly needing further intervention and or treatment.  Plan: 1.  High intensity cholesterol therapy with atorvastatin 2.  Dual antiplatelet therapy 3.  Proceed to transesophageal echocardiogram for further secondary sources of embolic phenomena to left middle cerebral stroke.  Patient understands the risk and  benefits of transesophageal echocardiogram.  This includes a possibility of death stroke heart attack sore throat esophageal perforation side effects of anesthesia.  The patient is at low risk for moderate sedation  Signed, Corey Skains M.D. [facility] 10/27/2020, 1:19 PM

## 2020-10-27 NOTE — Progress Notes (Signed)
PROGRESS NOTE    Angela Bowen  KZL:[id number] DOB: 02-05-1941 DOA: 10/26/2020 PCP: Venia Carbon, MD (Confirm with patient/family/NH records and if not entered, this HAS to be entered at [facility] point of entry. "No PCP" if truly none.)   Chief Complaint  Patient presents with  . Weakness    Brief Narrative:  Patient 80 year old female history of hypertension, GERD, diverticulosis, osteoporosis, hyperlipidemia presented to the ED with right lower extremity weakness x3 days.  Patient seen in consultation by neurology.  Patient underwent CT of the head which was unremarkable for any acute abnormalities.  MRI brain done with several scattered punctuate and subcentimeter acute infarcts at the left posterior frontal and parietal vertex affecting cortical and subcortical brain consistent with microembolic infarctions of the left MCA territory.  CT angiogram head and neck done concerning for left carotid stenosis and subclavian stenosis.  2D echo done with no source of emboli.  TEE pending for further evaluation.  Vascular surgery consulted for symptomatic left carotid stenosis.  Neurology following and patient started on dual antiplatelet therapy.   Assessment & Plan:   Principal Problem:   Acute CVA (cerebrovascular accident) [facility]) Active Problems:   Essential hypertension, benign   GERD   Symptomatic stenosis of left carotid artery  1 acute CVA/symptomatic left carotid artery -Patient presented with right lower extremity weakness x3 days. -CT head done negative for any acute abnormalities. -MRI brain done with several scattered punctuate and subcentimeter acute infarcts at the left posterior frontal and parietal vertex affecting cortical and subcortical brain consistent with microembolic infarctions of the left MCA territory.  -CT angiogram head and neck with severe stenosis proximal left subclavian artery, 80% or greater, advanced atherosclerotic change at the left ICA origin on bulb  minimal diameters 1 mm represented at least 75% stenosis.  No intracranial large or medium vessel occlusion.  No left MCA territory abnormality visible on CT angiography. -2D echo with EF 60 to 65%, not optimal left ventricular endocardial border to evaluate regional wall motion abnormalities.  Grade 1 diastolic dysfunction.  No source of emboli noted. -With clinical improvement. -Patient for TEE tomorrow per cardiology to rule out any cardioembolic source. -Continue dual antiplatelet therapy with aspirin and Plavix for at least 90 days (due to symptomatic left carotid artery) per neurology. -Patient started high-dose statin.  With goal LDL < 70. -Consult with vascular surgery for further evaluation and management -Neurology following and appreciate input and recommendations.  2.  Thyroid nodules -Outpatient follow-up for thyroid ultrasound.  3.  Gastroesophageal reflux disease -PPI.  4.  Hypertension -Antihypertensive medications on hold for permissive hypertension.   DVT prophylaxis: SCDs Code Status: Full Family Communication: Updated patient and husband at bedside. Disposition:   Status is: Inpatient    Dispo: The patient is from: Home              Anticipated d/c is to: Home              Patient currently undergoing stroke work-up.  Needing TEE to be done, evaluation of by vascular surgery.  Not stable for discharge   Difficult to place patient no       Consultants:   Cardiology: Dr. Nehemiah Massed 10/27/2020  Neuro hospitalist:Dr [place] 10/26/2020  Vascular surgery pending  Procedures:   CT head without contrast 10/26/2020  MRI brain 10/26/2020  CT angiogram head and neck 10/26/2020  2D echo 10/27/2020  TEE pending  Antimicrobials:   None   Subjective: Patient laying in bed.  Currently n.p.o. awaiting study to be done.  States some improvement with right lower extremity weakness.  No chest pain.  No shortness of breath.  No visual changes.  No speech abnormalities.   Asking when she is going to be able to to be discharged.  Objective: Vitals:   10/27/20 0330 10/27/20 0814 10/27/20 1225 10/27/20 1616  BP: (!) 148/61 (!) 121/45 (!) 118/45 (!) 147/53  Pulse: (!) 58 (!) 57 (!) 53 60  Resp: 16 18 18 18   Temp: 98.3 F (36.8 C) 98 F (36.7 C) 97.9 F (36.6 C) 97.7 F (36.5 C)  TempSrc: Oral     SpO2: 98% 96% 97% 95%  Weight:      Height:        Intake/Output Summary (Last 24 hours) at 10/27/2020 1853 Last data filed at 10/27/2020 1020 Gross per 24 hour  Intake 360 ml  Output --  Net 360 ml   Filed Weights   10/26/20 0911  Weight: 49.9 kg    Examination:  General exam: Appears calm and comfortable  Respiratory system: Clear to auscultation. Respiratory effort normal. Cardiovascular system: S1 & S2 heard, RRR. No JVD, murmurs, rubs, gallops or clicks. No pedal edema. Gastrointestinal system: Abdomen is nondistended, soft and nontender. No organomegaly or masses felt. Normal bowel sounds heard. Central nervous system: Alert and oriented. No focal neurological deficits.  Sensation intact. Extremities: Right lower extremity 4/5 strength.  Left lower extremity 5/5 strength.   Skin: No rashes, lesions or ulcers Psychiatry: Judgement and insight appear normal. Mood & affect appropriate.     Data Reviewed: I have personally reviewed following labs and imaging studies  CBC: Recent Labs  Lab 10/26/20 0924  WBC 8.9  NEUTROABS 5.7  HGB 14.5  HCT 42.0  MCV 88.4  PLT [id number]    Basic Metabolic Panel: Recent Labs  Lab 10/26/20 0924 10/27/20 0400  NA 136 134*  K 3.7 3.2*  CL 101 100  CO2 25 25  GLUCOSE 133* 90  BUN 14 15  CREATININE 0.95 0.84  CALCIUM 9.1 8.4*    GFR: Estimated Creatinine Clearance: 40.3 mL/min (by C-G formula based on SCr of 0.84 mg/dL).  Liver Function Tests: Recent Labs  Lab 10/26/20 0924  AST 25  ALT 18  ALKPHOS 60  BILITOT 0.9  PROT 7.2  ALBUMIN 4.1    CBG: No results for input(s): GLUCAP in the last  168 hours.   Recent Results (from the past 240 hour(s))  Resp Panel by RT-PCR (Flu A&B, Covid) Nasopharyngeal Swab     Status: None   Collection Time: 10/26/20  1:23 PM   Specimen: Nasopharyngeal Swab; Nasopharyngeal(NP) swabs in vial transport medium  Result Value Ref Range Status   SARS Coronavirus 2 by RT PCR NEGATIVE NEGATIVE Final    Comment: (NOTE) SARS-CoV-2 target nucleic acids are NOT DETECTED.  The SARS-CoV-2 RNA is generally detectable in upper respiratory specimens during the acute phase of infection. The lowest concentration of SARS-CoV-2 viral copies this assay can detect is 138 copies/mL. A negative result does not preclude SARS-Cov-2 infection and should not be used as the sole basis for treatment or other patient management decisions. A negative result may occur with  improper specimen collection/handling, submission of specimen other than nasopharyngeal swab, presence of viral mutation(s) within the areas targeted by this assay, and inadequate number of viral copies(<138 copies/mL). A negative result must be combined with clinical observations, patient history, and epidemiological information. The expected result is Negative.  Fact  Sheet for Patients:  EntrepreneurPulse.com.au  Fact Sheet for Healthcare Providers:  IncredibleEmployment.be  This test is no t yet approved or cleared by the [place] FDA and  has been authorized for detection and/or diagnosis of SARS-CoV-2 by FDA under an Emergency Use Authorization (EUA). This EUA will remain  in effect (meaning this test can be used) for the duration of the COVID-19 declaration under Section 564(b)(1) of the Act, 21 U.S.C.section 360bbb-3(b)(1), unless the authorization is terminated  or revoked sooner.       Influenza A by PCR NEGATIVE NEGATIVE Final   Influenza B by PCR NEGATIVE NEGATIVE Final    Comment: (NOTE) The Xpert Xpress SARS-CoV-2/FLU/RSV plus assay is  intended as an aid in the diagnosis of influenza from Nasopharyngeal swab specimens and should not be used as a sole basis for treatment. Nasal washings and aspirates are unacceptable for Xpert Xpress SARS-CoV-2/FLU/RSV testing.  Fact Sheet for Patients: EntrepreneurPulse.com.au  Fact Sheet for Healthcare Providers: IncredibleEmployment.be  This test is not yet approved or cleared by the [place] FDA and has been authorized for detection and/or diagnosis of SARS-CoV-2 by FDA under an Emergency Use Authorization (EUA). This EUA will remain in effect (meaning this test can be used) for the duration of the COVID-19 declaration under Section 564(b)(1) of the Act, 21 U.S.C. section 360bbb-3(b)(1), unless the authorization is terminated or revoked.  Performed at [facility], [address] [place], [place] [address]          Radiology Studies: CT HEAD WO CONTRAST  Result Date: 10/26/2020 CLINICAL DATA:  80 year old female with weakness for 3 days. Off balance. EXAM: CT HEAD WITHOUT CONTRAST TECHNIQUE: Contiguous axial images were obtained from the base of the skull through the vertex without intravenous contrast. COMPARISON:  None. FINDINGS: Brain: Cerebral volume is within normal limits for age. No midline shift, ventriculomegaly, mass effect, evidence of mass lesion, intracranial hemorrhage or evidence of cortically based acute infarction. Patchy and confluent bilateral white matter hypodensity. No cortical encephalomalacia identified. Deep gray matter nuclei, brainstem and cerebellum appear within normal limits. Vascular: Calcified atherosclerosis at the skull base. No suspicious intracranial vascular hyperdensity. Skull: Osteopenia.  No acute osseous abnormality identified. Sinuses/Orbits: Trace paranasal sinus mucosal thickening. Tympanic cavities and mastoids are clear. Other: Negative visible orbit and scalp soft tissues. IMPRESSION:  1.  No acute intracranial abnormality. 2. Bilateral cerebral white matter changes most commonly due to chronic small vessel disease. Electronically Signed   By: Genevie Ann M.D.   On: 10/26/2020 09:53   MR BRAIN WO CONTRAST  Result Date: 10/26/2020 CLINICAL DATA:  New right leg weakness EXAM: MRI HEAD WITHOUT CONTRAST TECHNIQUE: Multiplanar, multiecho pulse sequences of the brain and surrounding structures were obtained without intravenous contrast. COMPARISON:  Head CT same day FINDINGS: Brain: Diffusion imaging shows scattered subcentimeter acute infarction in the left frontal and parietal cortical and subcortical brain consistent with micro embolic infarctions in the left MCA territory. No large confluent infarction. No other acute insult. Mild chronic small-vessel ischemic change affects pons. No focal cerebellar finding. Cerebral hemispheres show moderate chronic small-vessel ischemic changes of the deep and subcortical white matter. Mild chronic small-vessel changes of the thalami and basal ganglia. No mass, hemorrhage, hydrocephalus or extra-axial collection. Vascular: Major vessels at the base of the brain show flow. Skull and upper cervical spine: Negative Sinuses/Orbits: Clear/normal Other: None IMPRESSION: Several scattered punctate and subcentimeter acute infarctions at the left posterior frontal and parietal vertex affecting the cortical and subcortical brain  consistent with micro embolic infarctions in the left MCA territory. Evidence of mass effect or hemorrhage. Background pattern of chronic small vessel ischemic changes elsewhere throughout the brain. Electronically Signed   By: Nelson Chimes M.D.   On: 10/26/2020 12:52   ECHOCARDIOGRAM COMPLETE  Result Date: 10/27/2020    ECHOCARDIOGRAM REPORT   Patient Name:   Angela Bowen Date of Exam: 10/27/2020 Medical Rec #:  [id number]        Height:       61.0 in Accession #:    [id number]       Weight:       110.0 lb Date of Birth:  [birth year]        BSA:           1.465 m Patient Age:    60 years         BP:           148/61 mmHg Patient Gender: F                HR:           62 bpm. Exam Location:  [facility] Procedure: 2D Echo, Color Doppler and Cardiac Doppler Indications:     I63.9 Stroke  History:         Patient has no prior history of Echocardiogram examinations.                  Risk Factors:Hypertension and Dyslipidemia.  Sonographer:     Charmayne Sheer RDCS (AE) Referring Phys:  [id number] Collier Bullock Diagnosing Phys: Nelva Bush MD  Sonographer Comments: Image acquisition challenging due to patient body habitus. IMPRESSIONS  1. Left ventricular ejection fraction, by estimation, is 60 to 65%. The left ventricle has normal function. Left ventricular endocardial border not optimally defined to evaluate regional wall motion. Left ventricular diastolic parameters are consistent with Grade I diastolic dysfunction (impaired relaxation).  2. Right ventricular systolic function is normal. The right ventricular size is normal.  3. The mitral valve is grossly normal. Trivial mitral valve regurgitation. No evidence of mitral stenosis.  4. The aortic valve has an indeterminant number of cusps. There is mild calcification of the aortic valve. There is mild thickening of the aortic valve. Aortic valve regurgitation is not visualized. Mild to moderate aortic valve sclerosis/calcification is present, without any evidence of aortic stenosis.  5. The inferior vena cava is normal in size with greater than 50% respiratory variability, suggesting right atrial pressure of 3 mmHg. FINDINGS  Left Ventricle: Left ventricular ejection fraction, by estimation, is 60 to 65%. The left ventricle has normal function. Left ventricular endocardial border not optimally defined to evaluate regional wall motion. The left ventricular internal cavity size was normal in size. There is no left ventricular hypertrophy. Left ventricular diastolic parameters are consistent with Grade I diastolic  dysfunction (impaired relaxation). Right Ventricle: The right ventricular size is normal. No increase in right ventricular wall thickness. Right ventricular systolic function is normal. Left Atrium: Left atrial size was normal in size. Right Atrium: Right atrial size was normal in size. Pericardium: There is no evidence of pericardial effusion. Mitral Valve: The mitral valve is grossly normal. Mild mitral annular calcification. Trivial mitral valve regurgitation. No evidence of mitral valve stenosis. MV peak gradient, 3.9 mmHg. The mean mitral valve gradient is 1.0 mmHg. Tricuspid Valve: The tricuspid valve is not well visualized. Tricuspid valve regurgitation is trivial. Aortic Valve: The aortic valve has an indeterminant number of cusps. There is  mild calcification of the aortic valve. There is mild thickening of the aortic valve. Aortic valve regurgitation is not visualized. Mild to moderate aortic valve sclerosis/calcification is present, without any evidence of aortic stenosis. Aortic valve mean gradient measures 5.0 mmHg. Aortic valve peak gradient measures 7.4 mmHg. Aortic valve area, by VTI measures 1.59 cm. Pulmonic Valve: The pulmonic valve was not well visualized. Pulmonic valve regurgitation is not visualized. No evidence of pulmonic stenosis. Aorta: The aortic root is normal in size and structure. Pulmonary Artery: The pulmonary artery is of normal size. Venous: The inferior vena cava is normal in size with greater than 50% respiratory variability, suggesting right atrial pressure of 3 mmHg. IAS/Shunts: The interatrial septum was not well visualized.  LEFT VENTRICLE PLAX 2D LVIDd:         3.80 cm  Diastology LVIDs:         2.50 cm  LV e' medial:    6.20 cm/s LV PW:         1.00 cm  LV E/e' medial:  8.8 LV IVS:        0.70 cm  LV e' lateral:   7.07 cm/s LVOT diam:     1.80 cm  LV E/e' lateral: 7.8 LV SV:         47 LV SV Index:   32 LVOT Area:     2.54 cm  RIGHT VENTRICLE RV Basal diam:  2.50 cm LEFT  ATRIUM             Index       RIGHT ATRIUM          Index LA diam:        3.00 cm 2.05 cm/m  RA Area:     8.05 cm LA Vol (A2C):   40.3 ml 27.51 ml/m RA Volume:   14.30 ml 9.76 ml/m LA Vol (A4C):   40.4 ml 27.58 ml/m LA Biplane Vol: 40.7 ml 27.78 ml/m  AORTIC VALVE                    PULMONIC VALVE AV Area (Vmax):    1.55 cm     PV Vmax:       0.72 m/s AV Area (Vmean):   1.37 cm     PV Vmean:      50.900 cm/s AV Area (VTI):     1.59 cm     PV VTI:        0.140 m AV Vmax:           136.00 cm/s  PV Peak grad:  2.1 mmHg AV Vmean:          105.000 cm/s PV Mean grad:  1.0 mmHg AV VTI:            0.295 m AV Peak Grad:      7.4 mmHg AV Mean Grad:      5.0 mmHg LVOT Vmax:         82.90 cm/s LVOT Vmean:        56.700 cm/s LVOT VTI:          0.184 m LVOT/AV VTI ratio: 0.62  AORTA Ao Root diam: 2.90 cm MITRAL VALVE MV Area (PHT): 2.77 cm    SHUNTS MV Area VTI:   2.37 cm    Systemic VTI:  0.18 m MV Peak grad:  3.9 mmHg    Systemic Diam: 1.80 cm MV Mean grad:  1.0 mmHg MV Vmax:  0.99 m/s MV Vmean:      49.1 cm/s MV Decel Time: 274 msec MV E velocity: 54.80 cm/s MV A velocity: 97.70 cm/s MV E/A ratio:  0.56 Harrell Gave End MD Electronically signed by Nelva Bush MD Signature Date/Time: 10/27/2020/2:45:10 PM    Final    CT ANGIO HEAD NECK W WO CM (CODE STROKE)  Result Date: 10/26/2020 CLINICAL DATA:  Weakness of the right foot and leg beginning about 3 days ago. Acute infarctions in the left MCA territory by MRI. EXAM: CT ANGIOGRAPHY HEAD AND NECK TECHNIQUE: Multidetector CT imaging of the head and neck was performed using the standard protocol during bolus administration of intravenous contrast. Multiplanar CT image reconstructions and MIPs were obtained to evaluate the vascular anatomy. Carotid stenosis measurements (when applicable) are obtained utilizing NASCET criteria, using the distal internal carotid diameter as the denominator. CONTRAST:  39mL OMNIPAQUE IOHEXOL 350 MG/ML SOLN COMPARISON:  MRI and CT  earlier same day. FINDINGS: CTA NECK FINDINGS Aortic arch: Aortic atherosclerotic calcification. Branching pattern is normal. Severe stenosis of the proximal left subclavian artery because of calcified plaque, 80% or greater. Right carotid system: Common carotid artery shows scattered calcified plaque but is sufficiently patent to the bifurcation region. No soft or calcified plaque at the carotid bifurcation or ICA bulb. Cervical ICA widely patent. Left carotid system: Common carotid artery shows scattered plaque but is sufficiently patent to the bifurcation. Calcified plaque at the carotid bifurcation and ICA bulb. Minimal diameter in the ICA bulb 1 mm. Compared to a more distal cervical ICA diameter of 4 mm, which may actually be flow reduced, this indicates of stenosis of at least 75%. Cervical ICA widely patent beyond that. Vertebral arteries: 30% narrowing at the right vertebral artery origin. Beyond that, the right vertebral artery is widely patent to the foramen magnum. As noted above, there is severe stenosis of the proximal left subclavian artery just beyond its origin from the arch. There is 30% narrowing at the non dominant left vertebral artery origin. Focal calcified plaque 2 cm beyond the origin results in 30% stenosis. Beyond that, the vessels show some scattered plaque but is sufficiently patent to the foramen magnum. Skeleton: Mid cervical spondylosis.  Bilateral facet osteoarthritis. Other neck: No lymphadenopathy. Enlarged heterogeneous thyroid gland. Largest nodule on the left measures up to 3 cm in diameter. Upper chest: Negative Review of the MIP images confirms the above findings CTA HEAD FINDINGS Anterior circulation: Both internal carotid arteries are patent through the skull base and siphon regions. Ordinary siphon atherosclerotic calcification but without stenosis greater than 30%. The anterior and middle cerebral vessels are patent. No large or medium vessel occlusion. No aneurysm or  vascular malformation. Posterior circulation: Both vertebral arteries are patent through the foramen magnum to the basilar. No basilar stenosis. Posterior circulation branch vessels are patent. No significant proximal stenosis. Venous sinuses: Patent and normal. Anatomic variants: None other significant. Review of the MIP images confirms the above findings IMPRESSION: Aortic atherosclerosis. Severe stenosis of the proximal left subclavian artery, 80% or greater. CT angiography is not able to establish flow direction in the left vertebral artery and subclavian steal is not excluded. Doppler ultrasound could accomplish that. No carotid bifurcation stenosis on the right. Advanced atherosclerotic change at the left ICA origin and bulb. Minimal diameter is 1 mm. This represents at least a 75% stenosis. 30% stenosis of both vertebral artery origins. Second 30% stenosis of the left vertebral artery 2 cm beyond the origin. No intracranial large or medium  vessel occlusion. No left MCA territory abnormality visible by CT angiography. Enlarged heterogeneous thyroid with multiple nodules, the largest measuring up to 3 cm on the left. Recommend thyroid US (ref: J Am Coll Radiol. 2015 Feb;12(2): 143-50). Electronically Signed   By: Nelson Chimes M.D.   On: 10/26/2020 16:53        Scheduled Meds: . aspirin EC  81 mg Oral Daily  . atorvastatin  40 mg Oral QHS  . clopidogrel  75 mg Oral Daily  . multivitamin with minerals  1 tablet Oral Daily  . pantoprazole  40 mg Oral QAC breakfast   Continuous Infusions: . sodium chloride 75 mL/hr at 10/27/20 1052     LOS: 1 day    Time spent: 40 minutes    Irine Seal, MD [facility]   To contact the attending provider between 7A-7P or the covering provider during after hours 7P-7A, please log into the web site [email or web address] and access using universal [place] password for that web site. If you do not have the password, please call the hospital  operator.  10/27/2020, 6:53 PM

## 2020-10-27 NOTE — Progress Notes (Signed)
Neurology Progress Note  Patient ID: Angela Bowen is a 80 y.o. with PMHx of  has a past medical history of Cancer [facility]), Diverticulosis of colon, Gallstones, GERD (gastroesophageal reflux disease), History of skin cancer, Hyperlipidemia, Hypertension, Normal stress echocardiogram (01/2006), Osteoarthritis, Osteoporosis, unspecified, S/P sclerotherapy of varicose veins (07/30/06), and Skin cancer.  Initially consulted for: Right leg weakness, stroke on MRI  Subjective: -Patient tolerating aspirin well denies any hives, shortness of breath, or any other skin changes -Reports her headache is minimal at this time -Feels that her right lower extremity weakness is very slightly improving  Exam: Vitals:   10/27/20 0330 10/27/20 0814  BP: (!) 148/61 (!) 121/45  Pulse: (!) 58 (!) 57  Resp: 16 18  Temp: 98.3 F (36.8 C) 98 F (36.7 C)  SpO2: 98% 96%   Gen: In bed, comfortable  Resp: non-labored breathing, no grossly audible wheezing Cardiac: Perfusing extremities well  Abd: soft, nt  Neuro: MS: Alert, awake, oriented to situation, place and time CN: EOMI, pupils equal, tongue midline, face symmetric Motor: Notably delayed dorsiflexion of the right foot with voluntary activation compared to the left foot.  No pronator drift. Sensory: Intact to light touch throughout   Pertinent Data: Lab Results  Component Value Date   CHOL 156 10/27/2020   HDL 34 (L) 10/27/2020   LDLCALC 103 (H) 10/27/2020   LDLDIRECT 121.8 01/30/2012   TRIG 96 10/27/2020   CHOLHDL 4.6 10/27/2020   Lab Results  Component Value Date   HGBA1C 5.9 (H) 10/27/2020     Lab Results  Component Value Date   TSH 3.375 10/27/2020    CTA personally reviewed, agree with radiology read: -Aortic atherosclerosis. -Severe stenosis of the proximal left subclavian artery, 80% or greater. CT angiography is not able to establish flow direction in the left vertebral artery and subclavian steal is not excluded. Doppler  ultrasound could accomplish that. -No carotid bifurcation stenosis on the right. -Advanced atherosclerotic change at the left ICA origin and bulb. Minimal diameter is 1 mm. This represents at least a 75% stenosis. -30% stenosis of both vertebral artery origins. Second 30% stenosis of the left vertebral artery 2 cm beyond the origin. -No intracranial large or medium vessel occlusion. No left MCA territory abnormality visible by CT angiography. -Enlarged heterogeneous thyroid with multiple nodules, the largest measuring up to 3 cm on the left. Recommend thyroid US (ref: J Am Coll Radiol. 2015 Feb;12(2): 143-50).  ECHO result pending  Impression: Likely symptomatic left carotid artery stenosis based on work-up above.  Appreciate vascular surgery evaluation.  Echocardiogram read pending, which I will follow-up and provide additional recommendations for if indicated; in particular if there is evidence of left atrial enlargement, would favor 30-day event monitor on discharge.   Recommendations:  #Left MCA territory multifocal embolic strokes -Vascular surgery consult for expedited left carotid evaluation and left subclavian artery evaluation -DAPT for 90 days given symptomatic left carotid artery, may be adjusted as needed per vascular surgery.  Would continue lifelong aspirin for secondary prevention -Atorvastatin 40 mg nightly for LDL goal less than 70 -A1c meeting goal of less than 7% -Appreciate PT and OT eval -Risk factor modification, medication adherence, diet, exercise, -Pending following up echocardiogram, neurology will be available on an as-needed basis going forward, please reach out if new questions arise  #Thyroid nodules -Outpatient thyroid ultrasound  Lesleigh Noe MD-PhD [facility] [phone]  [facility] coverage for [facility] is from 8 AM to 4 AM in-house and 4 PM  to 8 PM by telephone/video. 8 PM to 8 AM emergent questions or overnight urgent  questions should be addressed to Teleneurology On-call or [facility] neurohospitalist; contact information can be found on AMION  Greater than 35 minutes were spent in the care of this patient today, review of records as detailed above, examination and discussion with patient regarding medications, vascular surgery consult and risk factor modification.  Greater than 50% of this time was spent at bedside.

## 2020-10-27 NOTE — Plan of Care (Signed)
End of Shift Summary:  Alert and oriented x4. VSS. NSR on tele monitor. Remained on room air, sats >93%. Denies pain or n/v. mNIHSS 1 for Rt leg drift. Ambulated to bathroom with 1 assist. Remained free from falls or injury. Call bell within reach and able to use.

## 2020-10-27 NOTE — Progress Notes (Signed)
SLP Cancellation Note  Patient Details Name: Angela Bowen MRN: [id number] DOB: 1940-12-25   Cancelled treatment:       Reason Eval/Treat Not Completed: SLP screened, no needs identified, will sign off (chart reviewed; consulted NSG then met w/ pt in room) Pt denied any difficulty swallowing and is currently on a regular diet; tolerates swallowing pills w/ water per NSG. Breakfast was ordered together; pt drank coffee during session. Pt conversed at conversational level w/out deficits noted; pt denied any speech-language deficits and is A/O x4. She c/o dizziness that led her to the hospital.  No further skilled ST services indicated as pt appears at her baseline. Pt agreed. NSG to reconsult if any change in status while admitted.      Orinda Kenner, MS, [place] Speech Language Pathologist [facility] [phone] [facility] 10/27/2020, 9:03 AM

## 2020-10-27 NOTE — Evaluation (Signed)
Occupational Therapy Evaluation Patient Details Name: Angela Bowen MRN: [id number] DOB: Jul 17, 1940 Today's Date: 10/27/2020    History of Present Illness Angela Bowen is a 80 y.o. female with medical history significant for hypertension, GERD, diverticulosis, osteoporosis and dyslipidemia who presents to the emergency room via private vehicle for evaluation of right leg weakness which she has had for about 3 days.  Patient was in her usual state of health until 3 days ago when she noticed that she was dragging her right leg and had to hold onto surfaces to prevent her from falling.  Her husband notes that her gait has been unsteady.  She also complains of a frontal headache which she rates a 5 x 10 in intensity at its worst.  Per patient she does not usually get headaches. MRI reveals punctate areas of diffusion restriction in the left MCA territory particularly near the motor strip likely explaining her right leg weakness.   Clinical Impression   Pt seen for OT evaluation on this date. Upon arrival to room, pt awake and sitting upright in bed. Pt agreeable to OT evaluation/treatment. Prior to admission, pt was independent with ADLs/IADLs and functional mobility (w/out AD). Pt drives and cooks, and recently moved into a new home with her husband. Pt currently presents with decreased balance, and requires MIN GUARD for functional mobility of household distances (with unilateral UE support from IV pole), SUPERVISION for standing grooming tasks, SUPERVISION for toilet transfers/hygiene, and SUPERVISION to don socks at bed-level. Pt would benefit from additional skilled OT services to maximize return to PLOF and minimize risk of future falls, injury, caregiver burden, and readmission. Anticipate no additional OT needs following discharge.    Follow Up Recommendations  No OT follow up;Supervision - Intermittent    Equipment Recommendations  None recommended by OT       Precautions /  Restrictions Precautions Precautions: Fall Restrictions Weight Bearing Restrictions: No      Mobility Bed Mobility Overal bed mobility: Modified Independent             General bed mobility comments: MOD-I with use of bedrails    Transfers Overall transfer level: Needs assistance Equipment used: None Transfers: Sit to/from Stand Sit to Stand: Supervision              Balance Overall balance assessment: Needs assistance Sitting-balance support: No upper extremity supported;Feet supported Sitting balance-Leahy Scale: Normal Sitting balance - Comments: Normal sitting balance at EOB   Standing balance support: No upper extremity supported;During functional activity Standing balance-Leahy Scale: Fair Standing balance comment: MIN GUARD for standing sinkside to perform grooming tasks                           ADL either performed or assessed with clinical judgement   ADL Overall ADL's : Needs assistance/impaired     Grooming: Wash/dry hands;Wash/dry face;Supervision/safety;Set up;Standing               Lower Body Dressing: Supervision/safety;Set up;Bed level Lower Body Dressing Details (indicate cue type and reason): to don socks Toilet Transfer: Supervision/safety;Regular Toilet;Grab bars   Toileting- Clothing Manipulation and Hygiene: Supervision/safety;Sitting/lateral lean       Functional mobility during ADLs: Min guard (MIN GUARD to walk ~80 ft, with unilateral UE support from IV pole)       Vision Patient Visual Report: No change from baseline              Pertinent Vitals/Pain  Pain Assessment: No/denies pain     Hand Dominance Right   Extremity/Trunk Assessment Upper Extremity Assessment Upper Extremity Assessment: Overall WFL for tasks assessed   Lower Extremity Assessment Lower Extremity Assessment: Defer to PT evaluation   Cervical / Trunk Assessment Cervical / Trunk Assessment: Normal   Communication  Communication Communication: No difficulties   Cognition Arousal/Alertness: Awake/alert Behavior During Therapy: WFL for tasks assessed/performed Overall Cognitive Status: Within Functional Limits for tasks assessed                                 General Comments: Pleasant and agreeable throughout              Home Living Family/patient expects to be discharged to:: Private residence Living Arrangements: Spouse/significant other Available Help at Discharge: Family;Available 24 hours/day Type of Home: House Home Access: Stairs to enter [employer] of Steps: 5 from garage and 5 from front Entrance Stairs-Rails: Left Home Layout: One level     Bathroom Shower/Tub: Occupational psychologist: Standard     Home Equipment: Grab bars - tub/shower;Shower seat - built in          Prior Functioning/Environment Level of Independence: Independent        Comments: patient and husband recently moved into new home. Patient was fully independent prior to admission. Independent with ADLs/IADLs        OT Problem List: Impaired balance (sitting and/or standing)      OT Treatment/Interventions: Self-care/ADL training;Therapeutic exercise;DME and/or AE instruction;Therapeutic activities;Patient/family education;Balance training    OT Goals(Current goals can be found in the care plan section) Acute Rehab OT Goals Patient Stated Goal: to get better, return home OT Goal Formulation: With patient Time For Goal Achievement: 11/10/20 Potential to Achieve Goals: Good ADL Goals Pt Will Perform Grooming: with modified independence;standing Pt Will Perform Lower Body Dressing: with modified independence;sit to/from stand Pt Will Transfer to Toilet: with modified independence;ambulating;regular height toilet  OT Frequency: Min 1X/week    AM-PAC OT "6 Clicks" Daily Activity     Outcome Measure Help from another person eating meals?: None Help from  another person taking care of personal grooming?: A Little Help from another person toileting, which includes using toliet, bedpan, or urinal?: A Little Help from another person bathing (including washing, rinsing, drying)?: A Little Help from another person to put on and taking off regular upper body clothing?: A Little Help from another person to put on and taking off regular lower body clothing?: A Little 6 Click Score: 19   End of Session Equipment Utilized During Treatment: Other (comment) (pt using IV pole to steady during functional mobility) Nurse Communication: Mobility status  Activity Tolerance: Patient tolerated treatment well Patient left: in bed;with call bell/phone within reach;with bed alarm set  OT Visit Diagnosis: Unsteadiness on feet (R26.81)                Time: 2694-8546 OT Time Calculation (min): 23 min Charges:  OT General Charges $OT Visit: 1 Visit OT Evaluation $OT Eval Moderate Complexity: 1 Mod OT Treatments $Self Care/Home Management : 8-22 mins  Fredirick Maudlin, OTR/L [place]

## 2020-10-28 ENCOUNTER — Inpatient Hospital Stay
Admit: 2020-10-28 | Discharge: 2020-10-28 | Disposition: A | Discharge status: Home / Self Care | Payer: Medicare Other | Attending: Internal Medicine | Admitting: Internal Medicine

## 2020-10-28 ENCOUNTER — Encounter: Payer: Self-pay | Admitting: Certified Registered"

## 2020-10-28 ENCOUNTER — Encounter
Admission: EM | Disposition: A | Discharge status: Home / Self Care | Payer: Self-pay | Source: Home / Self Care | Attending: Internal Medicine

## 2020-10-28 DIAGNOSIS — R29898 Other symptoms and signs involving the musculoskeletal system: Secondary | ICD-10-CM

## 2020-10-28 DIAGNOSIS — I6389 Other cerebral infarction: Secondary | ICD-10-CM

## 2020-10-28 HISTORY — PX: TEE WITHOUT CARDIOVERSION: SHX5443

## 2020-10-28 LAB — BASIC METABOLIC PANEL
Anion gap: 5 (ref 5–15)
BUN: 12 mg/dL (ref 8–23)
CO2: 24 mmol/L (ref 22–32)
Calcium: 8.4 mg/dL — ABNORMAL LOW (ref 8.9–10.3)
Chloride: 110 mmol/L (ref 98–111)
Creatinine, Ser: 0.66 mg/dL (ref 0.44–1.00)
GFR, Estimated: >60 mL/min (ref >60)
Glucose, Bld: 100 mg/dL — ABNORMAL HIGH (ref 70–99)
Potassium: 4.7 mmol/L (ref 3.5–5.1)
Sodium: 139 mmol/L (ref 135–145)

## 2020-10-28 LAB — CBC
HCT: 40.1 % (ref 36.0–46.0)
Hemoglobin: 13.7 g/dL (ref 12.0–15.0)
MCH: 30.2 pg (ref 26.0–34.0)
MCHC: 34.2 g/dL (ref 30.0–36.0)
MCV: 88.3 fL (ref 80.0–100.0)
Platelets: 277 10*3/uL (ref 150–400)
RBC: 4.54 MIL/uL (ref 3.87–5.11)
RDW: 11.7 % (ref 11.5–15.5)
WBC: 7.9 10*3/uL (ref 4.0–10.5)
nRBC: 0 % (ref 0.0–0.2)

## 2020-10-28 SURGERY — ECHOCARDIOGRAM, TRANSESOPHAGEAL
Anesthesia: Moderate Sedation

## 2020-10-28 MED ORDER — CLOPIDOGREL BISULFATE 75 MG PO TABS: 75.0000 mg | ORAL_TABLET | Freq: Every day | ORAL | 0 refills | Status: DC

## 2020-10-28 MED ORDER — ASPIRIN 81 MG PO TBEC: 81.0000 mg | DELAYED_RELEASE_TABLET | Freq: Every day | ORAL | 0 refills | Status: AC

## 2020-10-28 MED ORDER — SODIUM CHLORIDE 0.9 % IV SOLN: INTRAVENOUS | Status: DC

## 2020-10-28 MED ORDER — MIDAZOLAM HCL 2 MG/2ML IJ SOLN
INTRAMUSCULAR | Status: AC | PRN
  Administered 2020-10-28: 2 mg via INTRAVENOUS

## 2020-10-28 MED ORDER — BUTAMBEN-TETRACAINE-BENZOCAINE 2-2-14 % EX AERO
INHALATION_SPRAY | CUTANEOUS | Status: AC | PRN
  Administered 2020-10-28: 1 via TOPICAL

## 2020-10-28 MED ORDER — LIDOCAINE VISCOUS HCL 2 % MT SOLN
OROMUCOSAL | Status: AC
  Filled 2020-10-28: qty 15

## 2020-10-28 MED ORDER — PANTOPRAZOLE SODIUM 40 MG PO TBEC: 40.0000 mg | DELAYED_RELEASE_TABLET | Freq: Every day | ORAL | 0 refills | Status: DC

## 2020-10-28 MED ORDER — LIDOCAINE VISCOUS HCL 2 % MT SOLN
OROMUCOSAL | Status: AC | PRN
  Administered 2020-10-28: 15 mL via OROMUCOSAL

## 2020-10-28 MED ORDER — BUTAMBEN-TETRACAINE-BENZOCAINE 2-2-14 % EX AERO
INHALATION_SPRAY | CUTANEOUS | Status: AC
  Filled 2020-10-28: qty 5

## 2020-10-28 MED ORDER — FENTANYL CITRATE (PF) 100 MCG/2ML IJ SOLN
INTRAMUSCULAR | Status: AC
  Filled 2020-10-28: qty 2

## 2020-10-28 MED ORDER — MIDAZOLAM HCL 5 MG/5ML IJ SOLN
INTRAMUSCULAR | Status: AC
  Filled 2020-10-28: qty 5

## 2020-10-28 MED ORDER — ATORVASTATIN CALCIUM 20 MG PO TABS: 40.0000 mg | ORAL_TABLET | Freq: Every day | ORAL | 0 refills | Status: DC

## 2020-10-28 MED ORDER — FENTANYL CITRATE (PF) 100 MCG/2ML IJ SOLN
INTRAMUSCULAR | Status: AC | PRN
  Administered 2020-10-28: 50 ug via INTRAVENOUS

## 2020-10-28 NOTE — CV Procedure (Signed)
Transesophageal echocardiogram preliminary report  Angela Bowen [id number] 02/20/1941  Preliminary diagnosis  Stroke with possible embolic source  Postprocedural diagnosis  Stroke with possible embolic source including aortic atherossclerosis  Time out A timeout was performed by the nursing staff and physicians specifically identifying the procedure performed, identification of the patient, the type of sedation, all allergies and medications, all pertinent medical history, and presedation assessment of nasopharynx. The patient and or family understand the risks of the procedure including the rare risks of death, stroke, heart attack, esophogeal perforation, sore throat, and reaction to medications given.  Moderate sedation During this procedure the patient has received Versed 2 milligrams and fentanyl 50 micrograms to achieve appropriate moderate sedation.  The patient had continued monitoring of heart rate, oxygenation, blood pressure, respiratory rate, and extent of signs of sedation throughout the entire procedure.  The patient received this moderate sedation over a period of 23 minutes.  Both the nursing staff and I were present during the procedure when the patient had moderate sedation for 100% of the time.  Treatment considerations Patient has normal LV systolic function with possible [place] source of transverse aortic arch atherosclerosis but no evidence of primary cardiac source  For further details of transesophageal echocardiogram please refer to final report.  Signed,  Corey Skains M.D. [facility] 10/28/2020 1:50 PM

## 2020-10-28 NOTE — Discharge Summary (Signed)
Physician Discharge Summary  Patient ID: Angela Bowen MRN: [id number] DOB/AGE: 80-Dec-1942 80 y.o.  Admit date: 10/26/2020 Discharge date: 10/28/2020  Admission Diagnoses:  Discharge Diagnoses:  Principal Problem:   Acute CVA (cerebrovascular accident) [facility]) Active Problems:   Essential hypertension, benign   GERD   Symptomatic stenosis of left carotid artery   Discharged Condition: good  Hospital Course:  Patient 80 year old female history of hypertension, GERD, diverticulosis, osteoporosis, hyperlipidemia presented to the ED with right lower extremity weakness x3 days.  Patient seen in consultation by neurology.  Patient underwent CT of the head which was unremarkable for any acute abnormalities.  MRI brain done with several scattered punctuate and subcentimeter acute infarcts at the left posterior frontal and parietal vertex affecting cortical and subcortical brain consistent with microembolic infarctions of the left MCA territory.  CT angiogram head and neck done concerning for left carotid stenosis and subclavian stenosis.  2D echo done with no source of emboli.  TEE pending for further evaluation.  Vascular surgery consulted for symptomatic left carotid stenosis.  Neurology following and patient started on dual antiplatelet therapy.  #1.  Acute left MCA stroke Left carotic arterial stenosis. Patient easily seen by neurology, TEE did not show any blood clots in the heart chambers. CT angiogram showed significant carotid stenosis. Patient will be treated with aspirin, Plavix and Lipitor.  She will follow-up with neurology as an outpatient. Patient also be evaluated by vascular surgery, scheduled for outpatient intervention for carotid stenosis.  #2.  Thyroid nodule. Follow-up with PCP as outpatient.  3.  Essential hypertension. Resume blood pressure medicines.    Consults: cardiology and neurology  Significant Diagnostic Studies:  TEE: 1. Left ventricular ejection  fraction, by estimation, is 60 to 65%. The left ventricle has normal function. The left ventricle has no regional wall motion abnormalities. 2. Right ventricular systolic function is normal. The right ventricular size is normal. 3. No left atrial/left atrial appendage thrombus was detected. 4. The mitral valve is normal in structure. Mild mitral valve regurgitation. 5. The aortic valve is normal in structure. Aortic valve regurgitation is not visualized. 6. There is Moderate (Grade III) protruding plaque involving the transverse aorta and descending aorta. 7. Agitated saline contrast bubble study was negative, with no evidence of any interatrial shunt.  CT ANGIOGRAPHY HEAD AND NECK  TECHNIQUE: Multidetector CT imaging of the head and neck was performed using the standard protocol during bolus administration of intravenous contrast. Multiplanar CT image reconstructions and MIPs were obtained to evaluate the vascular anatomy. Carotid stenosis measurements (when applicable) are obtained utilizing NASCET criteria, using the distal internal carotid diameter as the denominator.  CONTRAST:  17mL OMNIPAQUE IOHEXOL 350 MG/ML SOLN  COMPARISON:  MRI and CT earlier same day.  FINDINGS: CTA NECK FINDINGS  Aortic arch: Aortic atherosclerotic calcification. Branching pattern is normal. Severe stenosis of the proximal left subclavian artery because of calcified plaque, 80% or greater.  Right carotid system: Common carotid artery shows scattered calcified plaque but is sufficiently patent to the bifurcation region. No soft or calcified plaque at the carotid bifurcation or ICA bulb. Cervical ICA widely patent.  Left carotid system: Common carotid artery shows scattered plaque but is sufficiently patent to the bifurcation. Calcified plaque at the carotid bifurcation and ICA bulb. Minimal diameter in the ICA bulb 1 mm. Compared to a more distal cervical ICA diameter of 4 mm, which may  actually be flow reduced, this indicates of stenosis of at least 75%. Cervical ICA widely patent beyond  that.  Vertebral arteries: 30% narrowing at the right vertebral artery origin. Beyond that, the right vertebral artery is widely patent to the foramen magnum. As noted above, there is severe stenosis of the proximal left subclavian artery just beyond its origin from the arch. There is 30% narrowing at the non dominant left vertebral artery origin. Focal calcified plaque 2 cm beyond the origin results in 30% stenosis. Beyond that, the vessels show some scattered plaque but is sufficiently patent to the foramen magnum.  Skeleton: Mid cervical spondylosis.  Bilateral facet osteoarthritis.  Other neck: No lymphadenopathy. Enlarged heterogeneous thyroid gland. Largest nodule on the left measures up to 3 cm in diameter.  Upper chest: Negative  Review of the MIP images confirms the above findings  CTA HEAD FINDINGS  Anterior circulation: Both internal carotid arteries are patent through the skull base and siphon regions. Ordinary siphon atherosclerotic calcification but without stenosis greater than 30%. The anterior and middle cerebral vessels are patent. No large or medium vessel occlusion. No aneurysm or vascular malformation.  Posterior circulation: Both vertebral arteries are patent through the foramen magnum to the basilar. No basilar stenosis. Posterior circulation branch vessels are patent. No significant proximal stenosis.  Venous sinuses: Patent and normal.  Anatomic variants: None other significant.  Review of the MIP images confirms the above findings  IMPRESSION: Aortic atherosclerosis.  Severe stenosis of the proximal left subclavian artery, 80% or greater. CT angiography is not able to establish flow direction in the left vertebral artery and subclavian steal is not excluded. Doppler ultrasound could accomplish that.  No carotid bifurcation  stenosis on the right.  Advanced atherosclerotic change at the left ICA origin and bulb. Minimal diameter is 1 mm. This represents at least a 75% stenosis.  30% stenosis of both vertebral artery origins. Second 30% stenosis of the left vertebral artery 2 cm beyond the origin.  No intracranial large or medium vessel occlusion. No left MCA territory abnormality visible by CT angiography.  Enlarged heterogeneous thyroid with multiple nodules, the largest measuring up to 3 cm on the left. Recommend thyroid US (ref: J Am Coll Radiol. 2015 Feb;12(2): 143-50).   Electronically Signed   By: Nelson Chimes M.D.   On: 10/26/2020 16:53  MRI HEAD WITHOUT CONTRAST  TECHNIQUE: Multiplanar, multiecho pulse sequences of the brain and surrounding structures were obtained without intravenous contrast.  COMPARISON:  Head CT same day  FINDINGS: Brain: Diffusion imaging shows scattered subcentimeter acute infarction in the left frontal and parietal cortical and subcortical brain consistent with micro embolic infarctions in the left MCA territory. No large confluent infarction. No other acute insult.  Mild chronic small-vessel ischemic change affects pons. No focal cerebellar finding. Cerebral hemispheres show moderate chronic small-vessel ischemic changes of the deep and subcortical white matter. Mild chronic small-vessel changes of the thalami and basal ganglia. No mass, hemorrhage, hydrocephalus or extra-axial collection.  Vascular: Major vessels at the base of the brain show flow.  Skull and upper cervical spine: Negative  Sinuses/Orbits: Clear/normal  Other: None  IMPRESSION: Several scattered punctate and subcentimeter acute infarctions at the left posterior frontal and parietal vertex affecting the cortical and subcortical brain consistent with micro embolic infarctions in the left MCA territory. Evidence of mass effect or hemorrhage.  Background pattern of  chronic small vessel ischemic changes elsewhere throughout the brain.   Electronically Signed   By: Nelson Chimes M.D.   On: 10/26/2020 12:52   Treatments: Lipitor, asa, plavix  Discharge Exam: Blood pressure Marland Kitchen)  143/45, pulse (!) 56, temperature 98.9 F (37.2 C), resp. rate 16, height 5\' 1"  (1.549 m), weight 49.9 kg, SpO2 95 %. General appearance: alert and cooperative Resp: clear to auscultation bilaterally Cardio: regular rate and rhythm, S1, S2 normal, no murmur, click, rub or gallop GI: soft, non-tender; bowel sounds normal; no masses,  no organomegaly Extremities: extremities normal, atraumatic, no cyanosis or edema  Disposition: Discharge disposition: 01-Home or Self Care       Discharge Instructions    Diet - low sodium heart healthy   Complete by: As directed    Increase activity slowly   Complete by: As directed      Allergies as of 10/28/2020      Reactions   Meat [alpha-gal]    RED MEAT-CAUSES SEVERE HIVES,SOB   Aspirin    High doses - intolerant/causes hives only at high doses, tolerates 81 mg daily well   Codeine    REACTION: rash      Medication List    STOP taking these medications   omeprazole 20 MG capsule Commonly known as: PRILOSEC Replaced by: pantoprazole 40 MG tablet     TAKE these medications   aspirin 81 MG EC tablet Take 1 tablet (81 mg total) by mouth daily. Swallow whole.   atorvastatin 20 MG tablet Commonly known as: LIPITOR Take 2 tablets (40 mg total) by mouth at bedtime.   benazepril-hydrochlorthiazide 10-12.5 MG tablet Commonly known as: LOTENSIN HCT TAKE 1 TABLET DAILY   clopidogrel 75 MG tablet Commonly known as: PLAVIX Take 1 tablet (75 mg total) by mouth daily.   EPINEPHrine 0.3 mg/0.3 mL Soaj injection Commonly known as: EPI-PEN Inject 0.3 mLs (0.3 mg total) into the skin as needed.   multivitamin tablet Take 1 tablet by mouth daily.   pantoprazole 40 MG tablet Commonly known as: PROTONIX Take 1 tablet  (40 mg total) by mouth daily before breakfast. Start taking on: Oct 29, 2020 Replaces: omeprazole 20 MG capsule       Follow-up Information    Corey Skains, MD Follow up in 1 week(s).   Specialty: Cardiology Contact information: [place] [facility] [place] [address] [phone]        Viviana Simpler I, MD Follow up in 1 week(s).   Specialties: Internal Medicine, Pediatrics Contact information: [place] [address] [phone]        Kate Sable, MD .   Specialties: Cardiology, Radiology Contact information: [place] [address] [phone]        [facility] Follow up in 4 week(s).   Contact information: [place] [phone]             34 minutes Signed: Sharen Hones 10/28/2020, 2:42 PM

## 2020-10-28 NOTE — Progress Notes (Signed)
*  PRELIMINARY RESULTS* Echocardiogram Echocardiogram Transesophageal has been performed.  Angela Bowen 10/28/2020, 1:04 PM

## 2020-10-28 NOTE — Progress Notes (Signed)
[facility] Encounter Note  Patient: Angela Bowen / Admit Date: 10/26/2020 / Date of Encounter: 10/28/2020, 1:53 PM   Subjective: Overall patient doing much better at this time after stroke and recovering relatively well.  Patient has primary source of embolic stroke likely secondary to left carotid atherosclerosis but also potential source of transverse aortic arch atherosclerosis.  No evidence of source of embolism from a cardiac source based on transesophageal echocardiogram Patient is tolerating medications fairly well with no evidence of side effects  Review of Systems: Positive for: None Negative for: Vision change, hearing change, syncope, dizziness, nausea, vomiting,diarrhea, bloody stool, stomach pain, cough, congestion, diaphoresis, urinary frequency, urinary pain,skin lesions, skin rashes Others previously listed  Objective: Telemetry: Normal sinus rhythm Physical Exam: Blood pressure (!) 143/45, pulse (!) 56, temperature 98.9 F (37.2 C), resp. rate 16, height 5\' 1"  (1.549 m), weight 49.9 kg, SpO2 95 %. Body mass index is 20.78 kg/m. General: Well developed, well nourished, in no acute distress. Head: Normocephalic, atraumatic, sclera non-icteric, no xanthomas, nares are without discharge. Neck: No apparent masses Lungs: Normal respirations with no wheezes, no rhonchi, no rales , no crackles   Heart: Regular rate and rhythm, normal S1 S2, no murmur, no rub, no gallop, PMI is normal size and placement, carotid upstroke normal without bruit, jugular venous pressure normal Abdomen: Soft, non-tender, non-distended with normoactive bowel sounds. No hepatosplenomegaly. Abdominal aorta is normal size without bruit Extremities: No edema, no clubbing, no cyanosis, no ulcers,  Peripheral: 2+ radial, 2+ femoral, 2+ dorsal pedal pulses Neuro: Alert and oriented. Moves all extremities spontaneously. Psych:  Responds to questions appropriately with a normal  affect.   Intake/Output Summary (Last 24 hours) at 10/28/2020 1353 Last data filed at 10/28/2020 0600 Gross per 24 hour  Intake 1772.5 ml  Output 400 ml  Net 1372.5 ml    Inpatient Medications:  . aspirin EC  81 mg Oral Daily  . atorvastatin  40 mg Oral QHS  . butamben-tetracaine-benzocaine      . clopidogrel  75 mg Oral Daily  . fentaNYL      . lidocaine      . midazolam      . multivitamin with minerals  1 tablet Oral Daily  . pantoprazole  40 mg Oral QAC breakfast   Infusions:  . sodium chloride      Labs: Recent Labs    10/27/20 0400 10/28/20 0448  NA 134* 139  K 3.2* 4.7  CL 100 110  CO2 25 24  GLUCOSE 90 100*  BUN 15 12  CREATININE 0.84 0.66  CALCIUM 8.4* 8.4*   Recent Labs    10/26/20 0924  AST 25  ALT 18  ALKPHOS 60  BILITOT 0.9  PROT 7.2  ALBUMIN 4.1   Recent Labs    10/26/20 0924 10/28/20 0448  WBC 8.9 7.9  NEUTROABS 5.7  --   HGB 14.5 13.7  HCT 42.0 40.1  MCV 88.4 88.3  PLT 331 277   No results for input(s): CKTOTAL, CKMB, TROPONINI in the last 72 hours. Invalid input(s): POCBNP Recent Labs    10/27/20 0400  HGBA1C 5.9*     Weights: Filed Weights   10/26/20 0911 10/28/20 1142  Weight: 49.9 kg 49.9 kg     Radiology/Studies:  CT HEAD WO CONTRAST  Result Date: 10/26/2020 CLINICAL DATA:  80 year old female with weakness for 3 days. Off balance. EXAM: CT HEAD WITHOUT CONTRAST TECHNIQUE: Contiguous axial images were obtained from the base of  the skull through the vertex without intravenous contrast. COMPARISON:  None. FINDINGS: Brain: Cerebral volume is within normal limits for age. No midline shift, ventriculomegaly, mass effect, evidence of mass lesion, intracranial hemorrhage or evidence of cortically based acute infarction. Patchy and confluent bilateral white matter hypodensity. No cortical encephalomalacia identified. Deep gray matter nuclei, brainstem and cerebellum appear within normal limits. Vascular: Calcified atherosclerosis  at the skull base. No suspicious intracranial vascular hyperdensity. Skull: Osteopenia.  No acute osseous abnormality identified. Sinuses/Orbits: Trace paranasal sinus mucosal thickening. Tympanic cavities and mastoids are clear. Other: Negative visible orbit and scalp soft tissues. IMPRESSION: 1.  No acute intracranial abnormality. 2. Bilateral cerebral white matter changes most commonly due to chronic small vessel disease. Electronically Signed   By: Genevie Ann M.D.   On: 10/26/2020 09:53   MR BRAIN WO CONTRAST  Result Date: 10/26/2020 CLINICAL DATA:  New right leg weakness EXAM: MRI HEAD WITHOUT CONTRAST TECHNIQUE: Multiplanar, multiecho pulse sequences of the brain and surrounding structures were obtained without intravenous contrast. COMPARISON:  Head CT same day FINDINGS: Brain: Diffusion imaging shows scattered subcentimeter acute infarction in the left frontal and parietal cortical and subcortical brain consistent with micro embolic infarctions in the left MCA territory. No large confluent infarction. No other acute insult. Mild chronic small-vessel ischemic change affects pons. No focal cerebellar finding. Cerebral hemispheres show moderate chronic small-vessel ischemic changes of the deep and subcortical white matter. Mild chronic small-vessel changes of the thalami and basal ganglia. No mass, hemorrhage, hydrocephalus or extra-axial collection. Vascular: Major vessels at the base of the brain show flow. Skull and upper cervical spine: Negative Sinuses/Orbits: Clear/normal Other: None IMPRESSION: Several scattered punctate and subcentimeter acute infarctions at the left posterior frontal and parietal vertex affecting the cortical and subcortical brain consistent with micro embolic infarctions in the left MCA territory. Evidence of mass effect or hemorrhage. Background pattern of chronic small vessel ischemic changes elsewhere throughout the brain. Electronically Signed   By: Nelson Chimes M.D.   On:  10/26/2020 12:52   ECHOCARDIOGRAM COMPLETE  Result Date: 10/27/2020    ECHOCARDIOGRAM REPORT   Patient Name:   Angela Bowen Date of Exam: 10/27/2020 Medical Rec #:  [id number]        Height:       61.0 in Accession #:    [id number]       Weight:       110.0 lb Date of Birth:  [birth year]        BSA:          1.465 m Patient Age:    80 years         BP:           148/61 mmHg Patient Gender: F                HR:           62 bpm. Exam Location:  [facility] Procedure: 2D Echo, Color Doppler and Cardiac Doppler Indications:     I63.9 Stroke  History:         Patient has no prior history of Echocardiogram examinations.                  Risk Factors:Hypertension and Dyslipidemia.  Sonographer:     Charmayne Sheer RDCS (AE) Referring Phys:  [id number] Collier Bullock Diagnosing Phys: Nelva Bush MD  Sonographer Comments: Image acquisition challenging due to patient body habitus. IMPRESSIONS  1. Left ventricular ejection fraction, by estimation, is 60  to 65%. The left ventricle has normal function. Left ventricular endocardial border not optimally defined to evaluate regional wall motion. Left ventricular diastolic parameters are consistent with Grade I diastolic dysfunction (impaired relaxation).  2. Right ventricular systolic function is normal. The right ventricular size is normal.  3. The mitral valve is grossly normal. Trivial mitral valve regurgitation. No evidence of mitral stenosis.  4. The aortic valve has an indeterminant number of cusps. There is mild calcification of the aortic valve. There is mild thickening of the aortic valve. Aortic valve regurgitation is not visualized. Mild to moderate aortic valve sclerosis/calcification is present, without any evidence of aortic stenosis.  5. The inferior vena cava is normal in size with greater than 50% respiratory variability, suggesting right atrial pressure of 3 mmHg. FINDINGS  Left Ventricle: Left ventricular ejection fraction, by estimation, is 60 to 65%. The left  ventricle has normal function. Left ventricular endocardial border not optimally defined to evaluate regional wall motion. The left ventricular internal cavity size was normal in size. There is no left ventricular hypertrophy. Left ventricular diastolic parameters are consistent with Grade I diastolic dysfunction (impaired relaxation). Right Ventricle: The right ventricular size is normal. No increase in right ventricular wall thickness. Right ventricular systolic function is normal. Left Atrium: Left atrial size was normal in size. Right Atrium: Right atrial size was normal in size. Pericardium: There is no evidence of pericardial effusion. Mitral Valve: The mitral valve is grossly normal. Mild mitral annular calcification. Trivial mitral valve regurgitation. No evidence of mitral valve stenosis. MV peak gradient, 3.9 mmHg. The mean mitral valve gradient is 1.0 mmHg. Tricuspid Valve: The tricuspid valve is not well visualized. Tricuspid valve regurgitation is trivial. Aortic Valve: The aortic valve has an indeterminant number of cusps. There is mild calcification of the aortic valve. There is mild thickening of the aortic valve. Aortic valve regurgitation is not visualized. Mild to moderate aortic valve sclerosis/calcification is present, without any evidence of aortic stenosis. Aortic valve mean gradient measures 5.0 mmHg. Aortic valve peak gradient measures 7.4 mmHg. Aortic valve area, by VTI measures 1.59 cm. Pulmonic Valve: The pulmonic valve was not well visualized. Pulmonic valve regurgitation is not visualized. No evidence of pulmonic stenosis. Aorta: The aortic root is normal in size and structure. Pulmonary Artery: The pulmonary artery is of normal size. Venous: The inferior vena cava is normal in size with greater than 50% respiratory variability, suggesting right atrial pressure of 3 mmHg. IAS/Shunts: The interatrial septum was not well visualized.  LEFT VENTRICLE PLAX 2D LVIDd:         3.80 cm   Diastology LVIDs:         2.50 cm  LV e' medial:    6.20 cm/s LV PW:         1.00 cm  LV E/e' medial:  8.8 LV IVS:        0.70 cm  LV e' lateral:   7.07 cm/s LVOT diam:     1.80 cm  LV E/e' lateral: 7.8 LV SV:         47 LV SV Index:   32 LVOT Area:     2.54 cm  RIGHT VENTRICLE RV Basal diam:  2.50 cm LEFT ATRIUM             Index       RIGHT ATRIUM          Index LA diam:        3.00 cm 2.05 cm/m  RA Area:     8.05 cm LA Vol (A2C):   40.3 ml 27.51 ml/m RA Volume:   14.30 ml 9.76 ml/m LA Vol (A4C):   40.4 ml 27.58 ml/m LA Biplane Vol: 40.7 ml 27.78 ml/m  AORTIC VALVE                    PULMONIC VALVE AV Area (Vmax):    1.55 cm     PV Vmax:       0.72 m/s AV Area (Vmean):   1.37 cm     PV Vmean:      50.900 cm/s AV Area (VTI):     1.59 cm     PV VTI:        0.140 m AV Vmax:           136.00 cm/s  PV Peak grad:  2.1 mmHg AV Vmean:          105.000 cm/s PV Mean grad:  1.0 mmHg AV VTI:            0.295 m AV Peak Grad:      7.4 mmHg AV Mean Grad:      5.0 mmHg LVOT Vmax:         82.90 cm/s LVOT Vmean:        56.700 cm/s LVOT VTI:          0.184 m LVOT/AV VTI ratio: 0.62  AORTA Ao Root diam: 2.90 cm MITRAL VALVE MV Area (PHT): 2.77 cm    SHUNTS MV Area VTI:   2.37 cm    Systemic VTI:  0.18 m MV Peak grad:  3.9 mmHg    Systemic Diam: 1.80 cm MV Mean grad:  1.0 mmHg MV Vmax:       0.99 m/s MV Vmean:      49.1 cm/s MV Decel Time: 274 msec MV E velocity: 54.80 cm/s MV A velocity: 97.70 cm/s MV E/A ratio:  0.56 Harrell Gave End MD Electronically signed by Nelva Bush MD Signature Date/Time: 10/27/2020/2:45:10 PM    Final    ECHO TEE  Result Date: 10/28/2020    TRANSESOPHOGEAL ECHO REPORT   Patient Name:   DONDRA RHETT Date of Exam: 10/28/2020 Medical Rec #:  [id number]        Height:       61.0 in Accession #:    [id number]       Weight:       110.0 lb Date of Birth:  [birth year]        BSA:          1.465 m Patient Age:    80 years         BP:           142/52 mmHg Patient Gender: F                HR:            62 bpm. Exam Location:  [facility] Procedure: Transesophageal Echo, Cardiac Doppler and Color Doppler Indications:     Not listed on TEE check-in sheet  History:         Patient has prior history of Echocardiogram examinations, most                  recent 10/27/2020. Risk Factors:Hypertension.  Sonographer:     Sherrie Sport RDCS (AE) Referring Phys:  [facility] Diagnosing Phys: Serafina Royals MD PROCEDURE: The transesophogeal probe was passed without difficulty  through the esophogus of the patient. Sedation performed by performing physician. The patient developed no complications during the procedure. IMPRESSIONS  1. Left ventricular ejection fraction, by estimation, is 60 to 65%. The left ventricle has normal function. The left ventricle has no regional wall motion abnormalities.  2. Right ventricular systolic function is normal. The right ventricular size is normal.  3. No left atrial/left atrial appendage thrombus was detected.  4. The mitral valve is normal in structure. Mild mitral valve regurgitation.  5. The aortic valve is normal in structure. Aortic valve regurgitation is not visualized.  6. There is Moderate (Grade III) protruding plaque involving the transverse aorta and descending aorta.  7. Agitated saline contrast bubble study was negative, with no evidence of any interatrial shunt. FINDINGS  Left Ventricle: Left ventricular ejection fraction, by estimation, is 60 to 65%. The left ventricle has normal function. The left ventricle has no regional wall motion abnormalities. The left ventricular internal cavity size was normal in size. Right Ventricle: The right ventricular size is normal. No increase in right ventricular wall thickness. Right ventricular systolic function is normal. Left Atrium: Left atrial size was normal in size. No left atrial/left atrial appendage thrombus was detected. Right Atrium: Right atrial size was normal in size. Prominent Eustachian valve. Pericardium: There is no  evidence of pericardial effusion. Mitral Valve: The mitral valve is normal in structure. Mild mitral valve regurgitation. Tricuspid Valve: The tricuspid valve is normal in structure. Tricuspid valve regurgitation is trivial. Aortic Valve: The aortic valve is normal in structure. Aortic valve regurgitation is not visualized. Pulmonic Valve: The pulmonic valve was normal in structure. Pulmonic valve regurgitation is trivial. Aorta: The aortic root and ascending aorta are structurally normal, with no evidence of dilitation. There is moderate (Grade III) protruding plaque involving the transverse aorta and descending aorta. IAS/Shunts: No atrial level shunt detected by color flow Doppler. Agitated saline contrast was given intravenously to evaluate for intracardiac shunting. Agitated saline contrast bubble study was negative, with no evidence of any interatrial shunt. There  is no evidence of a patent foramen ovale. There is no evidence of an atrial septal defect. Serafina Royals MD Electronically signed by Serafina Royals MD Signature Date/Time: 10/28/2020/1:47:42 PM    Final    CT ANGIO HEAD NECK W WO CM (CODE STROKE)  Result Date: 10/26/2020 CLINICAL DATA:  Weakness of the right foot and leg beginning about 3 days ago. Acute infarctions in the left MCA territory by MRI. EXAM: CT ANGIOGRAPHY HEAD AND NECK TECHNIQUE: Multidetector CT imaging of the head and neck was performed using the standard protocol during bolus administration of intravenous contrast. Multiplanar CT image reconstructions and MIPs were obtained to evaluate the vascular anatomy. Carotid stenosis measurements (when applicable) are obtained utilizing NASCET criteria, using the distal internal carotid diameter as the denominator. CONTRAST:  47mL OMNIPAQUE IOHEXOL 350 MG/ML SOLN COMPARISON:  MRI and CT earlier same day. FINDINGS: CTA NECK FINDINGS Aortic arch: Aortic atherosclerotic calcification. Branching pattern is normal. Severe stenosis of the  proximal left subclavian artery because of calcified plaque, 80% or greater. Right carotid system: Common carotid artery shows scattered calcified plaque but is sufficiently patent to the bifurcation region. No soft or calcified plaque at the carotid bifurcation or ICA bulb. Cervical ICA widely patent. Left carotid system: Common carotid artery shows scattered plaque but is sufficiently patent to the bifurcation. Calcified plaque at the carotid bifurcation and ICA bulb. Minimal diameter in the ICA bulb 1 mm. Compared to a more  distal cervical ICA diameter of 4 mm, which may actually be flow reduced, this indicates of stenosis of at least 75%. Cervical ICA widely patent beyond that. Vertebral arteries: 30% narrowing at the right vertebral artery origin. Beyond that, the right vertebral artery is widely patent to the foramen magnum. As noted above, there is severe stenosis of the proximal left subclavian artery just beyond its origin from the arch. There is 30% narrowing at the non dominant left vertebral artery origin. Focal calcified plaque 2 cm beyond the origin results in 30% stenosis. Beyond that, the vessels show some scattered plaque but is sufficiently patent to the foramen magnum. Skeleton: Mid cervical spondylosis.  Bilateral facet osteoarthritis. Other neck: No lymphadenopathy. Enlarged heterogeneous thyroid gland. Largest nodule on the left measures up to 3 cm in diameter. Upper chest: Negative Review of the MIP images confirms the above findings CTA HEAD FINDINGS Anterior circulation: Both internal carotid arteries are patent through the skull base and siphon regions. Ordinary siphon atherosclerotic calcification but without stenosis greater than 30%. The anterior and middle cerebral vessels are patent. No large or medium vessel occlusion. No aneurysm or vascular malformation. Posterior circulation: Both vertebral arteries are patent through the foramen magnum to the basilar. No basilar stenosis.  Posterior circulation branch vessels are patent. No significant proximal stenosis. Venous sinuses: Patent and normal. Anatomic variants: None other significant. Review of the MIP images confirms the above findings IMPRESSION: Aortic atherosclerosis. Severe stenosis of the proximal left subclavian artery, 80% or greater. CT angiography is not able to establish flow direction in the left vertebral artery and subclavian steal is not excluded. Doppler ultrasound could accomplish that. No carotid bifurcation stenosis on the right. Advanced atherosclerotic change at the left ICA origin and bulb. Minimal diameter is 1 mm. This represents at least a 75% stenosis. 30% stenosis of both vertebral artery origins. Second 30% stenosis of the left vertebral artery 2 cm beyond the origin. No intracranial large or medium vessel occlusion. No left MCA territory abnormality visible by CT angiography. Enlarged heterogeneous thyroid with multiple nodules, the largest measuring up to 3 cm on the left. Recommend thyroid US (ref: J Am Coll Radiol. 2015 Feb;12(2): 143-50). Electronically Signed   By: Nelson Chimes M.D.   On: 10/26/2020 16:53     Assessment and Recommendation  80 y.o. female with hypertension hyperlipidemia and middle cerebral distribution embolic stroke most likely secondary to left carotid atherosclerosis and stenosis and no primary cardiac source currently without evidence of congestive heart failure or acute coronary syndrome and/or angina at lowest risk possible for cardiovascular complication with carotid surgery 1.  Aspirin and Plavix for treatment of stroke for the next several months 2.  High intensity cholesterol therapy 3.  Hypertension control if necessary goal systolic blood pressure below 130 mm 4.  No further cardiac diagnostics necessary at this time 5.  Proceed to further treatment of significant left carotid atherosclerosis and/or stenosis as per vascular surgery without restriction 6.  Call if  further questions otherwise if ambulating well after treatment of above would see as an outpatient for further adjustments of medication management  Signed, Serafina Royals M.D. FACC

## 2020-10-28 NOTE — Progress Notes (Signed)
Physical Therapy Treatment Patient Details Name: Angela Bowen MRN: [id number] DOB: 06-10-1941 Today's Date: 10/28/2020    History of Present Illness Angela Bowen is a 80 y.o. female with medical history significant for hypertension, GERD, diverticulosis, osteoporosis and dyslipidemia who presents to the emergency room via private vehicle for evaluation of right leg weakness which she has had for about 3 days.  Patient was in her usual state of health until 3 days ago when she noticed that she was dragging her right leg and had to hold onto surfaces to prevent her from falling.  Her husband notes that her gait has been unsteady.  She also complains of a frontal headache which she rates a 5 x 10 in intensity at its worst.  Per patient she does not usually get headaches. MRI reveals punctate areas of diffusion restriction in the left MCA territory particularly near the motor strip likely explaining her right leg weakness.    PT Comments    Patient received in bed, scheduled for TEE today. Husband at bedside. Patient reports she slept very well. Feeling fine. She is independent with bed mobility and transfers. Ambulated 200 feet with min guard for safety. No AD, no lob, slight antalgic gait favoring R LE. Patient will continue to benefit from skilled PT while here to improve mobility and safety.       Follow Up Recommendations  No PT follow up     Equipment Recommendations  Rolling walker with 5" wheels    Recommendations for Other Services       Precautions / Restrictions Precautions Precautions: Fall Restrictions Weight Bearing Restrictions: No    Mobility  Bed Mobility Overal bed mobility: Independent                  Transfers Overall transfer level: Modified independent Equipment used: None Transfers: Sit to/from Stand Sit to Stand: Modified independent (Device/Increase time)            Ambulation/Gait Ambulation/Gait assistance: Min guard Gait Distance  (Feet): 200 Feet Assistive device: None Gait Pattern/deviations: Antalgic;Step-through pattern;Decreased dorsiflexion - right Gait velocity: slightly decreased   General Gait Details: Patient ambulates with slight antalgic gait, favoring right. No catching left foot or toes while ambulating. Steady this date without ad.   Stairs             Wheelchair Mobility    Modified Rankin (Stroke Patients Only)       Balance Overall balance assessment: Modified Independent Sitting-balance support: Feet supported Sitting balance-Leahy Scale: Normal     Standing balance support: No upper extremity supported;During functional activity Standing balance-Leahy Scale: Good Standing balance comment: min guard for ambulation for safety                            Cognition Arousal/Alertness: Awake/alert Behavior During Therapy: WFL for tasks assessed/performed Overall Cognitive Status: Within Functional Limits for tasks assessed                                 General Comments: Pleasant and agreeable throughout      Exercises      General Comments        Pertinent Vitals/Pain Pain Assessment: No/denies pain    Home Living                      Prior Function  PT Goals (current goals can now be found in the care plan section) Acute Rehab PT Goals Patient Stated Goal: to get better, return home PT Goal Formulation: With patient Time For Goal Achievement: 10/31/20 Potential to Achieve Goals: Good Progress towards PT goals: Progressing toward goals    Frequency    Min 2X/week      PT Plan Frequency needs to be updated;Discharge plan needs to be updated    Co-evaluation              AM-PAC PT "6 Clicks" Mobility   Outcome Measure  Help needed turning from your back to your side while in a flat bed without using bedrails?: None Help needed moving from lying on your back to sitting on the side of a flat bed  without using bedrails?: None Help needed moving to and from a bed to a chair (including a wheelchair)?: None Help needed standing up from a chair using your arms (e.g., wheelchair or bedside chair)?: None Help needed to walk in hospital room?: A Little Help needed climbing 3-5 steps with a railing? : A Little 6 Click Score: 22    End of Session Equipment Utilized During Treatment: Gait belt Activity Tolerance: Patient tolerated treatment well Patient left: in bed;with call bell/phone within reach;with family/visitor present Nurse Communication: Mobility status PT Visit Diagnosis: Unsteadiness on feet (R26.81);Muscle weakness (generalized) (M62.81);Difficulty in walking, not elsewhere classified (R26.2)     Time: 1100-1120 PT Time Calculation (min) (ACUTE ONLY): 20 min  Charges:  $Gait Training: 8-22 mins                     Jayvin Hurrell, PT, GCS 10/28/20,11:33 AM

## 2020-10-28 NOTE — Consult Note (Signed)
Angela Bowen  MRN : [id number]  Angela Bowen is a 80 y.o. ([birth year]) female who presents with chief complaint of  Chief Complaint  Patient presents with  . Weakness   History of Present Illness: Angela Bowen is a 80 year old female with medical history significant for hypertension, GERD, diverticulosis, osteoporosis and dyslipidemia who presents to the emergency room via private vehicle for evaluation of right leg weakness which she has had for about 3 days.    Patient was in her usual state of health until three days ago when she noticed that she was dragging her right leg and had to hold onto surfaces to prevent her from falling. Her husband notes that her gait has been unsteady. She also complains of a frontal headache which she rates a 5 x 10 in intensity at its worst. Per patient she does not usually get headaches.  When her symptoms did not improve her husband called the urgent care center to take her for evaluation and was advised to go to the emergency room.  She denies having any blurred vision, difficulty swallowing, no dizziness or lightheadedness. She denies having any chest pain, no nausea, no vomiting, no fever, no chills, no cough, no abdominal pain, no urinary symptoms no palpitations, no diaphoresis, no shortness of breath.  CT scan of the head without contrast shows no acute intracranial abnormality. Bilateral cerebral white matter changes most commonly due to chronic small vessel disease.  MRI of the brain without contrast shows several scattered punctate and subcentimeter acute infarctions at the left posterior frontal and parietal vertex affecting the cortical and subcortical brain consistent with micro embolic infarctions in the left MCA territory. Evidence of mass effect or Hemorrhage. Background pattern of chronic small vessel ischemic changes elsewhere throughout the brain.  CTA neck (10/26/20):  Severe stenosis of  the proximal left subclavian artery, 80% or greater. CT angiography is not able to establish flow direction in the left vertebral artery and subclavian steal is not excluded. Doppler ultrasound could accomplish that. No carotid bifurcation stenosis on the right. Advanced atherosclerotic change at the left ICA origin and bulb. Minimal diameter is 1 mm. This represents at least a 75% stenosis.  30% stenosis of both vertebral artery origins. Second 30% stenosis of the left vertebral artery 2 cm beyond the origin.  No intracranial large or medium vessel occlusion. No left MCA territory abnormality visible by CT angiography.  Vascular surgery was consulted by Dr. Roosevelt Locks for possible repair.  Current Facility-Administered Medications  Medication Dose Route Frequency Provider Last Rate Last Admin  . 0.9 %  sodium chloride infusion   Intravenous Continuous Corey Skains, MD      . acetaminophen (TYLENOL) tablet 650 mg  650 mg Oral Q4H PRN Agbata, Tochukwu, MD       Or  . acetaminophen (TYLENOL) 160 MG/5ML solution 650 mg  650 mg Per Tube Q4H PRN Agbata, Tochukwu, MD       Or  . acetaminophen (TYLENOL) suppository 650 mg  650 mg Rectal Q4H PRN Agbata, Tochukwu, MD      . aspirin EC tablet 81 mg  81 mg Oral Daily Bhagat, Srishti L, MD   81 mg at 10/27/20 1045  . atorvastatin (LIPITOR) tablet 40 mg  40 mg Oral QHS Bhagat, Srishti L, MD   40 mg at 10/27/20 2147  . butamben-tetracaine-benzocaine (CETACAINE) 07-29-12 % spray           . clopidogrel (  PLAVIX) tablet 75 mg  75 mg Oral Daily Agbata, Tochukwu, MD   75 mg at 10/27/20 1046  . fentaNYL (SUBLIMAZE) 100 MCG/2ML injection           . lidocaine (XYLOCAINE) 2 % viscous mouth solution           . LORazepam (ATIVAN) tablet 1 mg  1 mg Oral PRN Vladimir Crofts, MD   1 mg at 10/26/20 1209  . midazolam (VERSED) 5 MG/5ML injection           . multivitamin with minerals tablet 1 tablet  1 tablet Oral Daily Agbata, Tochukwu, MD   1 tablet at 10/28/20 0946  .  pantoprazole (PROTONIX) EC tablet 40 mg  40 mg Oral QAC breakfast Agbata, Tochukwu, MD   40 mg at 10/28/20 0946  . senna-docusate (Senokot-S) tablet 1 tablet  1 tablet Oral QHS PRN Agbata, Tochukwu, MD      . traZODone (DESYREL) tablet 25 mg  25 mg Oral QHS PRN Mansy, Arvella Merles, MD   25 mg at 10/27/20 2147   Past Medical History:  Diagnosis Date  . Cancer ([place])    stage II melanoma; right leg;face  . Diverticulosis of colon   . Gallstones   . GERD (gastroesophageal reflux disease)   . History of skin cancer   . Hyperlipidemia   . Hypertension   . Normal stress echocardiogram 01/2006   echo 5/98, fairly normal  . Osteoarthritis   . Osteoporosis, unspecified   . S/P sclerotherapy of varicose veins 07/30/06   left leg, Dr. Eilleen Kempf  . Skin cancer    STAGE II ON RIGHT LEG   Past Surgical History:  Procedure Laterality Date  . CHOLECYSTECTOMY  2001  . FOOT SURGERY     right  . MELANOMA EXCISION  7/10   leg  . MOHS SURGERY    . VARICOSE VEIN SURGERY  1984   bilateral  . VARICOSE VEIN SURGERY  2004   right foot   Social History Social History   Tobacco Use  . Smoking status: Never Smoker  . Smokeless tobacco: Never Used  Substance Use Topics  . Alcohol use: Yes    Alcohol/week: 0.0 standard drinks    Comment: Wine, rarely  . Drug use: Never   Family History Family History  Problem Relation Age of Onset  . Alcohol abuse Mother   . Colon cancer Mother   . Osteoporosis Mother   . Alcohol abuse Father   . Diabetes Father   . Lung cancer Brother   . Ovarian cancer Maternal Grandmother   . Breast cancer Neg Hx   Denies family history of peripheral artery disease, venous disease or renal disease.  Allergies  Allergen Reactions  . Meat [Alpha-Gal]     RED MEAT-CAUSES SEVERE HIVES,SOB  . Aspirin     High doses - intolerant/causes hives only at high doses, tolerates 81 mg daily well  . Codeine     REACTION: rash   REVIEW OF SYSTEMS (Negative unless  checked)  Constitutional: [] Weight loss  [] Fever  [] Chills Cardiac: [] Chest pain   [] Chest pressure   [] Palpitations   [] Shortness of breath when laying flat   [] Shortness of breath at rest   [] Shortness of breath with exertion. Vascular:  [] Pain in legs with walking   [] Pain in legs at rest   [] Pain in legs when laying flat   [] Claudication   [] Pain in feet when walking  [] Pain in feet at rest  [] Pain in  feet when laying flat   [] History of DVT   [] Phlebitis   [] Swelling in legs   [] Varicose veins   [] Non-healing ulcers Pulmonary:   [] Uses home oxygen   [] Productive cough   [] Hemoptysis   [] Wheeze  [] COPD   [] Asthma Neurologic:  [] Dizziness  [] Blackouts   [] Seizures   [x] History of stroke   [] History of TIA  [] Aphasia   [] Temporary blindness   [] Dysphagia   [x] Weakness or numbness in arms   [x] Weakness or numbness in legs Musculoskeletal:  [] Arthritis   [] Joint swelling   [] Joint pain   [] Low back pain Hematologic:  [] Easy bruising  [] Easy bleeding   [] Hypercoagulable state   [] Anemic  [] Hepatitis Gastrointestinal:  [] Blood in stool   [] Vomiting blood  [x] Gastroesophageal reflux/heartburn   [] Difficulty swallowing. Genitourinary:  [] Chronic kidney disease   [] Difficult urination  [] Frequent urination  [] Burning with urination   [] Blood in urine Skin:  [] Rashes   [] Ulcers   [] Wounds Psychological:  [] History of anxiety   []  History of major depression.  Physical Examination  Vitals:   10/28/20 1306 10/28/20 1315 10/28/20 1330 10/28/20 1346  BP: 123/89 (!) 128/37 (!) 126/35 (!) 143/45  Pulse: 64 60 63 (!) 56  Resp:  15 15 16   Temp:    98.9 F (37.2 C)  TempSrc:      SpO2: 98% 96% 95% 95%  Weight:      Height:       Body mass index is 20.78 kg/m. Gen:  WD/WN, NAD Head: /AT, No temporalis wasting. Prominent temp pulse not noted. Ear/Nose/Throat: Hearing grossly intact, nares w/o erythema or drainage, oropharynx w/o Erythema/Exudate Eyes: Sclera non-icteric, conjunctiva clear Neck:  Trachea midline.  No JVD.  Pulmonary:  Good air movement, respirations not labored, equal bilaterally.  Cardiac: RRR, normal S1, S2. Vascular:  Vessel Right Left  Radial Palpable Palpable  Ulnar Palpable Palpable                               Gastrointestinal: soft, non-tender/non-distended. No guarding/reflex.  Musculoskeletal: M/S 5/5 throughout.  Extremities without ischemic changes.  No deformity or atrophy. No edema. Neurologic: Minimal left-sided upper and lower extremity weakness Psychiatric: Judgment intact, Mood & affect appropriate for pt's clinical situation. Dermatologic: No rashes or ulcers noted.  No cellulitis or open wounds. Lymph : No Cervical, Axillary, or Inguinal lymphadenopathy.  CBC Lab Results  Component Value Date   WBC 7.9 10/28/2020   HGB 13.7 10/28/2020   HCT 40.1 10/28/2020   MCV 88.3 10/28/2020   PLT 277 10/28/2020   BMET    Component Value Date/Time   NA 139 10/28/2020 0448   K 4.7 10/28/2020 0448   CL 110 10/28/2020 0448   CO2 24 10/28/2020 0448   GLUCOSE 100 (H) 10/28/2020 0448   BUN 12 10/28/2020 0448   CREATININE 0.66 10/28/2020 0448   CALCIUM 8.4 (L) 10/28/2020 0448   GFRNONAA >60 10/28/2020 0448   GFRAA >60 07/29/2017 0437   Estimated Creatinine Clearance: 42.3 mL/min (by C-G formula based on SCr of 0.66 mg/dL).  COAG Lab Results  Component Value Date   INR 1.0 10/26/2020   Radiology CT HEAD WO CONTRAST  Result Date: 10/26/2020 CLINICAL DATA:  80 year old female with weakness for 3 days. Off balance. EXAM: CT HEAD WITHOUT CONTRAST TECHNIQUE: Contiguous axial images were obtained from the base of the skull through the vertex without intravenous contrast. COMPARISON:  None. FINDINGS: Brain: Cerebral volume  is within normal limits for age. No midline shift, ventriculomegaly, mass effect, evidence of mass lesion, intracranial hemorrhage or evidence of cortically based acute infarction. Patchy and confluent bilateral white matter  hypodensity. No cortical encephalomalacia identified. Deep gray matter nuclei, brainstem and cerebellum appear within normal limits. Vascular: Calcified atherosclerosis at the skull base. No suspicious intracranial vascular hyperdensity. Skull: Osteopenia.  No acute osseous abnormality identified. Sinuses/Orbits: Trace paranasal sinus mucosal thickening. Tympanic cavities and mastoids are clear. Other: Negative visible orbit and scalp soft tissues. IMPRESSION: 1.  No acute intracranial abnormality. 2. Bilateral cerebral white matter changes most commonly due to chronic small vessel disease. Electronically Signed   By: Genevie Ann M.D.   On: 10/26/2020 09:53   MR BRAIN WO CONTRAST  Result Date: 10/26/2020 CLINICAL DATA:  New right leg weakness EXAM: MRI HEAD WITHOUT CONTRAST TECHNIQUE: Multiplanar, multiecho pulse sequences of the brain and surrounding structures were obtained without intravenous contrast. COMPARISON:  Head CT same day FINDINGS: Brain: Diffusion imaging shows scattered subcentimeter acute infarction in the left frontal and parietal cortical and subcortical brain consistent with micro embolic infarctions in the left MCA territory. No large confluent infarction. No other acute insult. Mild chronic small-vessel ischemic change affects pons. No focal cerebellar finding. Cerebral hemispheres show moderate chronic small-vessel ischemic changes of the deep and subcortical white matter. Mild chronic small-vessel changes of the thalami and basal ganglia. No mass, hemorrhage, hydrocephalus or extra-axial collection. Vascular: Major vessels at the base of the brain show flow. Skull and upper cervical spine: Negative Sinuses/Orbits: Clear/normal Other: None IMPRESSION: Several scattered punctate and subcentimeter acute infarctions at the left posterior frontal and parietal vertex affecting the cortical and subcortical brain consistent with micro embolic infarctions in the left MCA territory. Evidence of mass  effect or hemorrhage. Background pattern of chronic small vessel ischemic changes elsewhere throughout the brain. Electronically Signed   By: Nelson Chimes M.D.   On: 10/26/2020 12:52   ECHOCARDIOGRAM COMPLETE  Result Date: 10/27/2020    ECHOCARDIOGRAM REPORT   Patient Name:   Angela Bowen Date of Exam: 10/27/2020 Medical Rec #:  [id number]        Height:       61.0 in Accession #:    [id number]       Weight:       110.0 lb Date of Birth:  1941-03-23        BSA:          1.465 m Patient Age:    32 years         BP:           148/61 mmHg Patient Gender: F                HR:           62 bpm. Exam Location:  [facility] Procedure: 2D Echo, Color Doppler and Cardiac Doppler Indications:     I63.9 Stroke  History:         Patient has no prior history of Echocardiogram examinations.                  Risk Factors:Hypertension and Dyslipidemia.  Sonographer:     Charmayne Sheer RDCS (AE) Referring Phys:  [id number] Collier Bullock Diagnosing Phys: Nelva Bush MD  Sonographer Comments: Image acquisition challenging due to patient body habitus. IMPRESSIONS  1. Left ventricular ejection fraction, by estimation, is 60 to 65%. The left ventricle has normal function. Left ventricular endocardial border not optimally defined  to evaluate regional wall motion. Left ventricular diastolic parameters are consistent with Grade I diastolic dysfunction (impaired relaxation).  2. Right ventricular systolic function is normal. The right ventricular size is normal.  3. The mitral valve is grossly normal. Trivial mitral valve regurgitation. No evidence of mitral stenosis.  4. The aortic valve has an indeterminant number of cusps. There is mild calcification of the aortic valve. There is mild thickening of the aortic valve. Aortic valve regurgitation is not visualized. Mild to moderate aortic valve sclerosis/calcification is present, without any evidence of aortic stenosis.  5. The inferior vena cava is normal in size with greater than 50% respiratory  variability, suggesting right atrial pressure of 3 mmHg. FINDINGS  Left Ventricle: Left ventricular ejection fraction, by estimation, is 60 to 65%. The left ventricle has normal function. Left ventricular endocardial border not optimally defined to evaluate regional wall motion. The left ventricular internal cavity size was normal in size. There is no left ventricular hypertrophy. Left ventricular diastolic parameters are consistent with Grade I diastolic dysfunction (impaired relaxation). Right Ventricle: The right ventricular size is normal. No increase in right ventricular wall thickness. Right ventricular systolic function is normal. Left Atrium: Left atrial size was normal in size. Right Atrium: Right atrial size was normal in size. Pericardium: There is no evidence of pericardial effusion. Mitral Valve: The mitral valve is grossly normal. Mild mitral annular calcification. Trivial mitral valve regurgitation. No evidence of mitral valve stenosis. MV peak gradient, 3.9 mmHg. The mean mitral valve gradient is 1.0 mmHg. Tricuspid Valve: The tricuspid valve is not well visualized. Tricuspid valve regurgitation is trivial. Aortic Valve: The aortic valve has an indeterminant number of cusps. There is mild calcification of the aortic valve. There is mild thickening of the aortic valve. Aortic valve regurgitation is not visualized. Mild to moderate aortic valve sclerosis/calcification is present, without any evidence of aortic stenosis. Aortic valve mean gradient measures 5.0 mmHg. Aortic valve peak gradient measures 7.4 mmHg. Aortic valve area, by VTI measures 1.59 cm. Pulmonic Valve: The pulmonic valve was not well visualized. Pulmonic valve regurgitation is not visualized. No evidence of pulmonic stenosis. Aorta: The aortic root is normal in size and structure. Pulmonary Artery: The pulmonary artery is of normal size. Venous: The inferior vena cava is normal in size with greater than 50% respiratory variability,  suggesting right atrial pressure of 3 mmHg. IAS/Shunts: The interatrial septum was not well visualized.  LEFT VENTRICLE PLAX 2D LVIDd:         3.80 cm  Diastology LVIDs:         2.50 cm  LV e' medial:    6.20 cm/s LV PW:         1.00 cm  LV E/e' medial:  8.8 LV IVS:        0.70 cm  LV e' lateral:   7.07 cm/s LVOT diam:     1.80 cm  LV E/e' lateral: 7.8 LV SV:         47 LV SV Index:   32 LVOT Area:     2.54 cm  RIGHT VENTRICLE RV Basal diam:  2.50 cm LEFT ATRIUM             Index       RIGHT ATRIUM          Index LA diam:        3.00 cm 2.05 cm/m  RA Area:     8.05 cm LA Vol (A2C):   40.3 ml  27.51 ml/m RA Volume:   14.30 ml 9.76 ml/m LA Vol (A4C):   40.4 ml 27.58 ml/m LA Biplane Vol: 40.7 ml 27.78 ml/m  AORTIC VALVE                    PULMONIC VALVE AV Area (Vmax):    1.55 cm     PV Vmax:       0.72 m/s AV Area (Vmean):   1.37 cm     PV Vmean:      50.900 cm/s AV Area (VTI):     1.59 cm     PV VTI:        0.140 m AV Vmax:           136.00 cm/s  PV Peak grad:  2.1 mmHg AV Vmean:          105.000 cm/s PV Mean grad:  1.0 mmHg AV VTI:            0.295 m AV Peak Grad:      7.4 mmHg AV Mean Grad:      5.0 mmHg LVOT Vmax:         82.90 cm/s LVOT Vmean:        56.700 cm/s LVOT VTI:          0.184 m LVOT/AV VTI ratio: 0.62  AORTA Ao Root diam: 2.90 cm MITRAL VALVE MV Area (PHT): 2.77 cm    SHUNTS MV Area VTI:   2.37 cm    Systemic VTI:  0.18 m MV Peak grad:  3.9 mmHg    Systemic Diam: 1.80 cm MV Mean grad:  1.0 mmHg MV Vmax:       0.99 m/s MV Vmean:      49.1 cm/s MV Decel Time: 274 msec MV E velocity: 54.80 cm/s MV A velocity: 97.70 cm/s MV E/A ratio:  0.56 Harrell Gave End MD Electronically signed by Nelva Bush MD Signature Date/Time: 10/27/2020/2:45:10 PM    Final    ECHO TEE  Result Date: 10/28/2020    TRANSESOPHOGEAL ECHO REPORT   Patient Name:   Angela Bowen Date of Exam: 10/28/2020 Medical Rec #:  [id number]        Height:       61.0 in Accession #:    [id number]       Weight:       110.0 lb Date of  Birth:  July 08, 1940        BSA:          1.465 m Patient Age:    70 years         BP:           142/52 mmHg Patient Gender: F                HR:           62 bpm. Exam Location:  [facility] Procedure: Transesophageal Echo, Cardiac Doppler and Color Doppler Indications:     Not listed on TEE check-in sheet  History:         Patient has prior history of Echocardiogram examinations, most                  recent 10/27/2020. Risk Factors:Hypertension.  Sonographer:     Sherrie Sport RDCS (AE) Referring Phys:  [place] Diagnosing Phys: Serafina Royals MD PROCEDURE: The transesophogeal probe was passed without difficulty through the esophogus of the patient. Sedation performed by performing physician. The patient developed no  complications during the procedure. IMPRESSIONS  1. Left ventricular ejection fraction, by estimation, is 60 to 65%. The left ventricle has normal function. The left ventricle has no regional wall motion abnormalities.  2. Right ventricular systolic function is normal. The right ventricular size is normal.  3. No left atrial/left atrial appendage thrombus was detected.  4. The mitral valve is normal in structure. Mild mitral valve regurgitation.  5. The aortic valve is normal in structure. Aortic valve regurgitation is not visualized.  6. There is Moderate (Grade III) protruding plaque involving the transverse aorta and descending aorta.  7. Agitated saline contrast bubble study was negative, with no evidence of any interatrial shunt. FINDINGS  Left Ventricle: Left ventricular ejection fraction, by estimation, is 60 to 65%. The left ventricle has normal function. The left ventricle has no regional wall motion abnormalities. The left ventricular internal cavity size was normal in size. Right Ventricle: The right ventricular size is normal. No increase in right ventricular wall thickness. Right ventricular systolic function is normal. Left Atrium: Left atrial size was normal in size. No left  atrial/left atrial appendage thrombus was detected. Right Atrium: Right atrial size was normal in size. Prominent Eustachian valve. Pericardium: There is no evidence of pericardial effusion. Mitral Valve: The mitral valve is normal in structure. Mild mitral valve regurgitation. Tricuspid Valve: The tricuspid valve is normal in structure. Tricuspid valve regurgitation is trivial. Aortic Valve: The aortic valve is normal in structure. Aortic valve regurgitation is not visualized. Pulmonic Valve: The pulmonic valve was normal in structure. Pulmonic valve regurgitation is trivial. Aorta: The aortic root and ascending aorta are structurally normal, with no evidence of dilitation. There is moderate (Grade III) protruding plaque involving the transverse aorta and descending aorta. IAS/Shunts: No atrial level shunt detected by color flow Doppler. Agitated saline contrast was given intravenously to evaluate for intracardiac shunting. Agitated saline contrast bubble study was negative, with no evidence of any interatrial shunt. There  is no evidence of a patent foramen ovale. There is no evidence of an atrial septal defect. Serafina Royals MD Electronically signed by Serafina Royals MD Signature Date/Time: 10/28/2020/1:47:42 PM    Final    CT ANGIO HEAD NECK W WO CM (CODE STROKE)  Result Date: 10/26/2020 CLINICAL DATA:  Weakness of the right foot and leg beginning about 3 days ago. Acute infarctions in the left MCA territory by MRI. EXAM: CT ANGIOGRAPHY HEAD AND NECK TECHNIQUE: Multidetector CT imaging of the head and neck was performed using the standard protocol during bolus administration of intravenous contrast. Multiplanar CT image reconstructions and MIPs were obtained to evaluate the vascular anatomy. Carotid stenosis measurements (when applicable) are obtained utilizing NASCET criteria, using the distal internal carotid diameter as the denominator. CONTRAST:  51mL OMNIPAQUE IOHEXOL 350 MG/ML SOLN COMPARISON:  MRI and  CT earlier same day. FINDINGS: CTA NECK FINDINGS Aortic arch: Aortic atherosclerotic calcification. Branching pattern is normal. Severe stenosis of the proximal left subclavian artery because of calcified plaque, 80% or greater. Right carotid system: Common carotid artery shows scattered calcified plaque but is sufficiently patent to the bifurcation region. No soft or calcified plaque at the carotid bifurcation or ICA bulb. Cervical ICA widely patent. Left carotid system: Common carotid artery shows scattered plaque but is sufficiently patent to the bifurcation. Calcified plaque at the carotid bifurcation and ICA bulb. Minimal diameter in the ICA bulb 1 mm. Compared to a more distal cervical ICA diameter of 4 mm, which may actually be flow reduced, this indicates  of stenosis of at least 75%. Cervical ICA widely patent beyond that. Vertebral arteries: 30% narrowing at the right vertebral artery origin. Beyond that, the right vertebral artery is widely patent to the foramen magnum. As noted above, there is severe stenosis of the proximal left subclavian artery just beyond its origin from the arch. There is 30% narrowing at the non dominant left vertebral artery origin. Focal calcified plaque 2 cm beyond the origin results in 30% stenosis. Beyond that, the vessels show some scattered plaque but is sufficiently patent to the foramen magnum. Skeleton: Mid cervical spondylosis.  Bilateral facet osteoarthritis. Other neck: No lymphadenopathy. Enlarged heterogeneous thyroid gland. Largest nodule on the left measures up to 3 cm in diameter. Upper chest: Negative Review of the MIP images confirms the above findings CTA HEAD FINDINGS Anterior circulation: Both internal carotid arteries are patent through the skull base and siphon regions. Ordinary siphon atherosclerotic calcification but without stenosis greater than 30%. The anterior and middle cerebral vessels are patent. No large or medium vessel occlusion. No aneurysm or  vascular malformation. Posterior circulation: Both vertebral arteries are patent through the foramen magnum to the basilar. No basilar stenosis. Posterior circulation branch vessels are patent. No significant proximal stenosis. Venous sinuses: Patent and normal. Anatomic variants: None other significant. Review of the MIP images confirms the above findings IMPRESSION: Aortic atherosclerosis. Severe stenosis of the proximal left subclavian artery, 80% or greater. CT angiography is not able to establish flow direction in the left vertebral artery and subclavian steal is not excluded. Doppler ultrasound could accomplish that. No carotid bifurcation stenosis on the right. Advanced atherosclerotic change at the left ICA origin and bulb. Minimal diameter is 1 mm. This represents at least a 75% stenosis. 30% stenosis of both vertebral artery origins. Second 30% stenosis of the left vertebral artery 2 cm beyond the origin. No intracranial large or medium vessel occlusion. No left MCA territory abnormality visible by CT angiography. Enlarged heterogeneous thyroid with multiple nodules, the largest measuring up to 3 cm on the left. Recommend thyroid US (ref: J Am Coll Radiol. 2015 Feb;12(2): 143-50). Electronically Signed   By: Nelson Chimes M.D.   On: 10/26/2020 16:53   Assessment/Plan Angela Bowen is a 80 year old female with medical history significant for hypertension, GERD, diverticulosis, osteoporosis and dyslipidemia who presents to the emergency room via private vehicle for evaluation of right leg weakness which she has had for about 3 days.  Acute CVA with significant carotid disease.  1.  Carotid Artery Stenosis: Patient presented to the [facility] Department complaining of progressively worsening right sided weakness which was occurring for approximately three days. Patient found to have acute CVA in the setting of severe left internal carotid artery as well as proximal left  subclavian artery stenosis. Patient is on aspirin, Lipitor, and Plavix. Vascular surgery was consulted for repair.  In the setting of an acute CVA any repair whether it be stent or open will have to wait for approximately 3 weeks. I did have a long discussion in regards to open versus stenting. Patient and her family would like to proceed with stenting.  Patient was directed to continue to take her aspirin and Plavix in preparation for stent.  Our office will reach out to the patient and her family in regard to a date and time.  2.  Hypertension: On appropriate medications. Encouraged good control as its slows the progression of atherosclerotic disease.  3.  GERD: On appropriate medications. Encouraged  good control as its slows the progression of atherosclerotic disease.  Discussed with Dr. Mayme Genta, PA-C  10/28/2020 2:25 PM  This Bowen was created with Dragon medical transcription system.  Any error is purely unintentional.

## 2020-10-28 NOTE — Plan of Care (Signed)
Alert and oriented x4. VSS. Received Mag replacement for run of MAT. Remained on room air, sats >93%. Denies pain or n/v. NPO since midnight for TEE today. Ambulated to bathroom with 1 assist. Remained free from falls or injury. Call bell within reach and able to use.

## 2020-10-28 NOTE — Progress Notes (Signed)
Went through discharge instruction with the patient. Pt left POV with her husband

## 2020-10-28 NOTE — H&P (View-Only) (Signed)
[facility] Vascular Consult Note  MRN : [id number]  Angela Bowen is a 80 y.o. (18-Dec-1940) female who presents with chief complaint of  Chief Complaint  Patient presents with  . Weakness   History of Present Illness: Angela Bowen is a 80 year old female with medical history significant for hypertension, GERD, diverticulosis, osteoporosis and dyslipidemia who presents to the emergency room via private vehicle for evaluation of right leg weakness which she has had for about 3 days.    Patient was in her usual state of health until three days ago when she noticed that she was dragging her right leg and had to hold onto surfaces to prevent her from falling. Her husband notes that her gait has been unsteady. She also complains of a frontal headache which she rates a 5 x 10 in intensity at its worst. Per patient she does not usually get headaches.  When her symptoms did not improve her husband called the urgent care center to take her for evaluation and was advised to go to the emergency room.  She denies having any blurred vision, difficulty swallowing, no dizziness or lightheadedness. She denies having any chest pain, no nausea, no vomiting, no fever, no chills, no cough, no abdominal pain, no urinary symptoms no palpitations, no diaphoresis, no shortness of breath.  CT scan of the head without contrast shows no acute intracranial abnormality. Bilateral cerebral white matter changes most commonly due to chronic small vessel disease.  MRI of the brain without contrast shows several scattered punctate and subcentimeter acute infarctions at the left posterior frontal and parietal vertex affecting the cortical and subcortical brain consistent with micro embolic infarctions in the left MCA territory. Evidence of mass effect or Hemorrhage. Background pattern of chronic small vessel ischemic changes elsewhere throughout the brain.  CTA neck (10/26/20):  Severe stenosis of  the proximal left subclavian artery, 80% or greater. CT angiography is not able to establish flow direction in the left vertebral artery and subclavian steal is not excluded. Doppler ultrasound could accomplish that. No carotid bifurcation stenosis on the right. Advanced atherosclerotic change at the left ICA origin and bulb. Minimal diameter is 1 mm. This represents at least a 75% stenosis.  30% stenosis of both vertebral artery origins. Second 30% stenosis of the left vertebral artery 2 cm beyond the origin.  No intracranial large or medium vessel occlusion. No left MCA territory abnormality visible by CT angiography.  Vascular surgery was consulted by Dr. Roosevelt Locks for possible repair.  Current Facility-Administered Medications  Medication Dose Route Frequency Provider Last Rate Last Admin  . 0.9 %  sodium chloride infusion   Intravenous Continuous Corey Skains, MD      . acetaminophen (TYLENOL) tablet 650 mg  650 mg Oral Q4H PRN Agbata, Tochukwu, MD       Or  . acetaminophen (TYLENOL) 160 MG/5ML solution 650 mg  650 mg Per Tube Q4H PRN Agbata, Tochukwu, MD       Or  . acetaminophen (TYLENOL) suppository 650 mg  650 mg Rectal Q4H PRN Agbata, Tochukwu, MD      . aspirin EC tablet 81 mg  81 mg Oral Daily Bhagat, Srishti L, MD   81 mg at 10/27/20 1045  . atorvastatin (LIPITOR) tablet 40 mg  40 mg Oral QHS Bhagat, Srishti L, MD   40 mg at 10/27/20 2147  . butamben-tetracaine-benzocaine (CETACAINE) 07-29-12 % spray           . clopidogrel (  PLAVIX) tablet 75 mg  75 mg Oral Daily Agbata, Tochukwu, MD   75 mg at 10/27/20 1046  . fentaNYL (SUBLIMAZE) 100 MCG/2ML injection           . lidocaine (XYLOCAINE) 2 % viscous mouth solution           . LORazepam (ATIVAN) tablet 1 mg  1 mg Oral PRN Vladimir Crofts, MD   1 mg at 10/26/20 1209  . midazolam (VERSED) 5 MG/5ML injection           . multivitamin with minerals tablet 1 tablet  1 tablet Oral Daily Agbata, Tochukwu, MD   1 tablet at 10/28/20 0946  .  pantoprazole (PROTONIX) EC tablet 40 mg  40 mg Oral QAC breakfast Agbata, Tochukwu, MD   40 mg at 10/28/20 0946  . senna-docusate (Senokot-S) tablet 1 tablet  1 tablet Oral QHS PRN Agbata, Tochukwu, MD      . traZODone (DESYREL) tablet 25 mg  25 mg Oral QHS PRN Mansy, Arvella Merles, MD   25 mg at 10/27/20 2147   Past Medical History:  Diagnosis Date  . Cancer ([place])    stage II melanoma; right leg;face  . Diverticulosis of colon   . Gallstones   . GERD (gastroesophageal reflux disease)   . History of skin cancer   . Hyperlipidemia   . Hypertension   . Normal stress echocardiogram 01/2006   echo 5/98, fairly normal  . Osteoarthritis   . Osteoporosis, unspecified   . S/P sclerotherapy of varicose veins 07/30/06   left leg, Dr. Eilleen Kempf  . Skin cancer    STAGE II ON RIGHT LEG   Past Surgical History:  Procedure Laterality Date  . CHOLECYSTECTOMY  2001  . FOOT SURGERY     right  . MELANOMA EXCISION  7/10   leg  . MOHS SURGERY    . VARICOSE VEIN SURGERY  1984   bilateral  . VARICOSE VEIN SURGERY  2004   right foot   Social History Social History   Tobacco Use  . Smoking status: Never Smoker  . Smokeless tobacco: Never Used  Substance Use Topics  . Alcohol use: Yes    Alcohol/week: 0.0 standard drinks    Comment: Wine, rarely  . Drug use: Never   Family History Family History  Problem Relation Age of Onset  . Alcohol abuse Mother   . Colon cancer Mother   . Osteoporosis Mother   . Alcohol abuse Father   . Diabetes Father   . Lung cancer Brother   . Ovarian cancer Maternal Grandmother   . Breast cancer Neg Hx   Denies family history of peripheral artery disease, venous disease or renal disease.  Allergies  Allergen Reactions  . Meat [Alpha-Gal]     RED MEAT-CAUSES SEVERE HIVES,SOB  . Aspirin     High doses - intolerant/causes hives only at high doses, tolerates 81 mg daily well  . Codeine     REACTION: rash   REVIEW OF SYSTEMS (Negative unless  checked)  Constitutional: [] Weight loss  [] Fever  [] Chills Cardiac: [] Chest pain   [] Chest pressure   [] Palpitations   [] Shortness of breath when laying flat   [] Shortness of breath at rest   [] Shortness of breath with exertion. Vascular:  [] Pain in legs with walking   [] Pain in legs at rest   [] Pain in legs when laying flat   [] Claudication   [] Pain in feet when walking  [] Pain in feet at rest  [] Pain in  feet when laying flat   [] History of DVT   [] Phlebitis   [] Swelling in legs   [] Varicose veins   [] Non-healing ulcers Pulmonary:   [] Uses home oxygen   [] Productive cough   [] Hemoptysis   [] Wheeze  [] COPD   [] Asthma Neurologic:  [] Dizziness  [] Blackouts   [] Seizures   [x] History of stroke   [] History of TIA  [] Aphasia   [] Temporary blindness   [] Dysphagia   [x] Weakness or numbness in arms   [x] Weakness or numbness in legs Musculoskeletal:  [] Arthritis   [] Joint swelling   [] Joint pain   [] Low back pain Hematologic:  [] Easy bruising  [] Easy bleeding   [] Hypercoagulable state   [] Anemic  [] Hepatitis Gastrointestinal:  [] Blood in stool   [] Vomiting blood  [x] Gastroesophageal reflux/heartburn   [] Difficulty swallowing. Genitourinary:  [] Chronic kidney disease   [] Difficult urination  [] Frequent urination  [] Burning with urination   [] Blood in urine Skin:  [] Rashes   [] Ulcers   [] Wounds Psychological:  [] History of anxiety   []  History of major depression.  Physical Examination  Vitals:   10/28/20 1306 10/28/20 1315 10/28/20 1330 10/28/20 1346  BP: 123/89 (!) 128/37 (!) 126/35 (!) 143/45  Pulse: 64 60 63 (!) 56  Resp:  15 15 16   Temp:    98.9 F (37.2 C)  TempSrc:      SpO2: 98% 96% 95% 95%  Weight:      Height:       Body mass index is 20.78 kg/m. Gen:  WD/WN, NAD Head: [place]/AT, No temporalis wasting. Prominent temp pulse not noted. Ear/Nose/Throat: Hearing grossly intact, nares w/o erythema or drainage, oropharynx w/o Erythema/Exudate Eyes: Sclera non-icteric, conjunctiva clear Neck:  Trachea midline.  No JVD.  Pulmonary:  Good air movement, respirations not labored, equal bilaterally.  Cardiac: RRR, normal S1, S2. Vascular:  Vessel Right Left  Radial Palpable Palpable  Ulnar Palpable Palpable                               Gastrointestinal: soft, non-tender/non-distended. No guarding/reflex.  Musculoskeletal: M/S 5/5 throughout.  Extremities without ischemic changes.  No deformity or atrophy. No edema. Neurologic: Minimal left-sided upper and lower extremity weakness Psychiatric: Judgment intact, Mood & affect appropriate for pt's clinical situation. Dermatologic: No rashes or ulcers noted.  No cellulitis or open wounds. Lymph : No Cervical, Axillary, or Inguinal lymphadenopathy.  CBC Lab Results  Component Value Date   WBC 7.9 10/28/2020   HGB 13.7 10/28/2020   HCT 40.1 10/28/2020   MCV 88.3 10/28/2020   PLT 277 10/28/2020   BMET    Component Value Date/Time   NA 139 10/28/2020 0448   K 4.7 10/28/2020 0448   CL 110 10/28/2020 0448   CO2 24 10/28/2020 0448   GLUCOSE 100 (H) 10/28/2020 0448   BUN 12 10/28/2020 0448   CREATININE 0.66 10/28/2020 0448   CALCIUM 8.4 (L) 10/28/2020 0448   GFRNONAA >60 10/28/2020 0448   GFRAA >60 07/29/2017 0437   Estimated Creatinine Clearance: 42.3 mL/min (by C-G formula based on SCr of 0.66 mg/dL).  COAG Lab Results  Component Value Date   INR 1.0 10/26/2020   Radiology CT HEAD WO CONTRAST  Result Date: 10/26/2020 CLINICAL DATA:  80 year old female with weakness for 3 days. Off balance. EXAM: CT HEAD WITHOUT CONTRAST TECHNIQUE: Contiguous axial images were obtained from the base of the skull through the vertex without intravenous contrast. COMPARISON:  None. FINDINGS: Brain: Cerebral volume  is within normal limits for age. No midline shift, ventriculomegaly, mass effect, evidence of mass lesion, intracranial hemorrhage or evidence of cortically based acute infarction. Patchy and confluent bilateral white matter  hypodensity. No cortical encephalomalacia identified. Deep gray matter nuclei, brainstem and cerebellum appear within normal limits. Vascular: Calcified atherosclerosis at the skull base. No suspicious intracranial vascular hyperdensity. Skull: Osteopenia.  No acute osseous abnormality identified. Sinuses/Orbits: Trace paranasal sinus mucosal thickening. Tympanic cavities and mastoids are clear. Other: Negative visible orbit and scalp soft tissues. IMPRESSION: 1.  No acute intracranial abnormality. 2. Bilateral cerebral white matter changes most commonly due to chronic small vessel disease. Electronically Signed   By: Genevie Ann M.D.   On: 10/26/2020 09:53   MR BRAIN WO CONTRAST  Result Date: 10/26/2020 CLINICAL DATA:  New right leg weakness EXAM: MRI HEAD WITHOUT CONTRAST TECHNIQUE: Multiplanar, multiecho pulse sequences of the brain and surrounding structures were obtained without intravenous contrast. COMPARISON:  Head CT same day FINDINGS: Brain: Diffusion imaging shows scattered subcentimeter acute infarction in the left frontal and parietal cortical and subcortical brain consistent with micro embolic infarctions in the left MCA territory. No large confluent infarction. No other acute insult. Mild chronic small-vessel ischemic change affects pons. No focal cerebellar finding. Cerebral hemispheres show moderate chronic small-vessel ischemic changes of the deep and subcortical white matter. Mild chronic small-vessel changes of the thalami and basal ganglia. No mass, hemorrhage, hydrocephalus or extra-axial collection. Vascular: Major vessels at the base of the brain show flow. Skull and upper cervical spine: Negative Sinuses/Orbits: Clear/normal Other: None IMPRESSION: Several scattered punctate and subcentimeter acute infarctions at the left posterior frontal and parietal vertex affecting the cortical and subcortical brain consistent with micro embolic infarctions in the left MCA territory. Evidence of mass  effect or hemorrhage. Background pattern of chronic small vessel ischemic changes elsewhere throughout the brain. Electronically Signed   By: Nelson Chimes M.D.   On: 10/26/2020 12:52   ECHOCARDIOGRAM COMPLETE  Result Date: 10/27/2020    ECHOCARDIOGRAM REPORT   Patient Name:   Angela Bowen Date of Exam: 10/27/2020 Medical Rec #:  [id number]        Height:       61.0 in Accession #:    [id number]       Weight:       110.0 lb Date of Birth:  1941-03-23        BSA:          1.465 m Patient Age:    32 years         BP:           148/61 mmHg Patient Gender: F                HR:           62 bpm. Exam Location:  [facility] Procedure: 2D Echo, Color Doppler and Cardiac Doppler Indications:     I63.9 Stroke  History:         Patient has no prior history of Echocardiogram examinations.                  Risk Factors:Hypertension and Dyslipidemia.  Sonographer:     Charmayne Sheer RDCS (AE) Referring Phys:  [id number] Collier Bullock Diagnosing Phys: Nelva Bush MD  Sonographer Comments: Image acquisition challenging due to patient body habitus. IMPRESSIONS  1. Left ventricular ejection fraction, by estimation, is 60 to 65%. The left ventricle has normal function. Left ventricular endocardial border not optimally defined  to evaluate regional wall motion. Left ventricular diastolic parameters are consistent with Grade I diastolic dysfunction (impaired relaxation).  2. Right ventricular systolic function is normal. The right ventricular size is normal.  3. The mitral valve is grossly normal. Trivial mitral valve regurgitation. No evidence of mitral stenosis.  4. The aortic valve has an indeterminant number of cusps. There is mild calcification of the aortic valve. There is mild thickening of the aortic valve. Aortic valve regurgitation is not visualized. Mild to moderate aortic valve sclerosis/calcification is present, without any evidence of aortic stenosis.  5. The inferior vena cava is normal in size with greater than 50% respiratory  variability, suggesting right atrial pressure of 3 mmHg. FINDINGS  Left Ventricle: Left ventricular ejection fraction, by estimation, is 60 to 65%. The left ventricle has normal function. Left ventricular endocardial border not optimally defined to evaluate regional wall motion. The left ventricular internal cavity size was normal in size. There is no left ventricular hypertrophy. Left ventricular diastolic parameters are consistent with Grade I diastolic dysfunction (impaired relaxation). Right Ventricle: The right ventricular size is normal. No increase in right ventricular wall thickness. Right ventricular systolic function is normal. Left Atrium: Left atrial size was normal in size. Right Atrium: Right atrial size was normal in size. Pericardium: There is no evidence of pericardial effusion. Mitral Valve: The mitral valve is grossly normal. Mild mitral annular calcification. Trivial mitral valve regurgitation. No evidence of mitral valve stenosis. MV peak gradient, 3.9 mmHg. The mean mitral valve gradient is 1.0 mmHg. Tricuspid Valve: The tricuspid valve is not well visualized. Tricuspid valve regurgitation is trivial. Aortic Valve: The aortic valve has an indeterminant number of cusps. There is mild calcification of the aortic valve. There is mild thickening of the aortic valve. Aortic valve regurgitation is not visualized. Mild to moderate aortic valve sclerosis/calcification is present, without any evidence of aortic stenosis. Aortic valve mean gradient measures 5.0 mmHg. Aortic valve peak gradient measures 7.4 mmHg. Aortic valve area, by VTI measures 1.59 cm. Pulmonic Valve: The pulmonic valve was not well visualized. Pulmonic valve regurgitation is not visualized. No evidence of pulmonic stenosis. Aorta: The aortic root is normal in size and structure. Pulmonary Artery: The pulmonary artery is of normal size. Venous: The inferior vena cava is normal in size with greater than 50% respiratory variability,  suggesting right atrial pressure of 3 mmHg. IAS/Shunts: The interatrial septum was not well visualized.  LEFT VENTRICLE PLAX 2D LVIDd:         3.80 cm  Diastology LVIDs:         2.50 cm  LV e' medial:    6.20 cm/s LV PW:         1.00 cm  LV E/e' medial:  8.8 LV IVS:        0.70 cm  LV e' lateral:   7.07 cm/s LVOT diam:     1.80 cm  LV E/e' lateral: 7.8 LV SV:         47 LV SV Index:   32 LVOT Area:     2.54 cm  RIGHT VENTRICLE RV Basal diam:  2.50 cm LEFT ATRIUM             Index       RIGHT ATRIUM          Index LA diam:        3.00 cm 2.05 cm/m  RA Area:     8.05 cm LA Vol (A2C):   40.3 ml  27.51 ml/m RA Volume:   14.30 ml 9.76 ml/m LA Vol (A4C):   40.4 ml 27.58 ml/m LA Biplane Vol: 40.7 ml 27.78 ml/m  AORTIC VALVE                    PULMONIC VALVE AV Area (Vmax):    1.55 cm     PV Vmax:       0.72 m/s AV Area (Vmean):   1.37 cm     PV Vmean:      50.900 cm/s AV Area (VTI):     1.59 cm     PV VTI:        0.140 m AV Vmax:           136.00 cm/s  PV Peak grad:  2.1 mmHg AV Vmean:          105.000 cm/s PV Mean grad:  1.0 mmHg AV VTI:            0.295 m AV Peak Grad:      7.4 mmHg AV Mean Grad:      5.0 mmHg LVOT Vmax:         82.90 cm/s LVOT Vmean:        56.700 cm/s LVOT VTI:          0.184 m LVOT/AV VTI ratio: 0.62  AORTA Ao Root diam: 2.90 cm MITRAL VALVE MV Area (PHT): 2.77 cm    SHUNTS MV Area VTI:   2.37 cm    Systemic VTI:  0.18 m MV Peak grad:  3.9 mmHg    Systemic Diam: 1.80 cm MV Mean grad:  1.0 mmHg MV Vmax:       0.99 m/s MV Vmean:      49.1 cm/s MV Decel Time: 274 msec MV E velocity: 54.80 cm/s MV A velocity: 97.70 cm/s MV E/A ratio:  0.56 Harrell Gave End MD Electronically signed by Nelva Bush MD Signature Date/Time: 10/27/2020/2:45:10 PM    Final    ECHO TEE  Result Date: 10/28/2020    TRANSESOPHOGEAL ECHO REPORT   Patient Name:   Angela Bowen Date of Exam: 10/28/2020 Medical Rec #:  [id number]        Height:       61.0 in Accession #:    [id number]       Weight:       110.0 lb Date of  Birth:  July 08, 1940        BSA:          1.465 m Patient Age:    70 years         BP:           142/52 mmHg Patient Gender: F                HR:           62 bpm. Exam Location:  [facility] Procedure: Transesophageal Echo, Cardiac Doppler and Color Doppler Indications:     Not listed on TEE check-in sheet  History:         Patient has prior history of Echocardiogram examinations, most                  recent 10/27/2020. Risk Factors:Hypertension.  Sonographer:     Sherrie Sport RDCS (AE) Referring Phys:  [place] Diagnosing Phys: Serafina Royals MD PROCEDURE: The transesophogeal probe was passed without difficulty through the esophogus of the patient. Sedation performed by performing physician. The patient developed no  complications during the procedure. IMPRESSIONS  1. Left ventricular ejection fraction, by estimation, is 60 to 65%. The left ventricle has normal function. The left ventricle has no regional wall motion abnormalities.  2. Right ventricular systolic function is normal. The right ventricular size is normal.  3. No left atrial/left atrial appendage thrombus was detected.  4. The mitral valve is normal in structure. Mild mitral valve regurgitation.  5. The aortic valve is normal in structure. Aortic valve regurgitation is not visualized.  6. There is Moderate (Grade III) protruding plaque involving the transverse aorta and descending aorta.  7. Agitated saline contrast bubble study was negative, with no evidence of any interatrial shunt. FINDINGS  Left Ventricle: Left ventricular ejection fraction, by estimation, is 60 to 65%. The left ventricle has normal function. The left ventricle has no regional wall motion abnormalities. The left ventricular internal cavity size was normal in size. Right Ventricle: The right ventricular size is normal. No increase in right ventricular wall thickness. Right ventricular systolic function is normal. Left Atrium: Left atrial size was normal in size. No left  atrial/left atrial appendage thrombus was detected. Right Atrium: Right atrial size was normal in size. Prominent Eustachian valve. Pericardium: There is no evidence of pericardial effusion. Mitral Valve: The mitral valve is normal in structure. Mild mitral valve regurgitation. Tricuspid Valve: The tricuspid valve is normal in structure. Tricuspid valve regurgitation is trivial. Aortic Valve: The aortic valve is normal in structure. Aortic valve regurgitation is not visualized. Pulmonic Valve: The pulmonic valve was normal in structure. Pulmonic valve regurgitation is trivial. Aorta: The aortic root and ascending aorta are structurally normal, with no evidence of dilitation. There is moderate (Grade III) protruding plaque involving the transverse aorta and descending aorta. IAS/Shunts: No atrial level shunt detected by color flow Doppler. Agitated saline contrast was given intravenously to evaluate for intracardiac shunting. Agitated saline contrast bubble study was negative, with no evidence of any interatrial shunt. There  is no evidence of a patent foramen ovale. There is no evidence of an atrial septal defect. Serafina Royals MD Electronically signed by Serafina Royals MD Signature Date/Time: 10/28/2020/1:47:42 PM    Final    CT ANGIO HEAD NECK W WO CM (CODE STROKE)  Result Date: 10/26/2020 CLINICAL DATA:  Weakness of the right foot and leg beginning about 3 days ago. Acute infarctions in the left MCA territory by MRI. EXAM: CT ANGIOGRAPHY HEAD AND NECK TECHNIQUE: Multidetector CT imaging of the head and neck was performed using the standard protocol during bolus administration of intravenous contrast. Multiplanar CT image reconstructions and MIPs were obtained to evaluate the vascular anatomy. Carotid stenosis measurements (when applicable) are obtained utilizing NASCET criteria, using the distal internal carotid diameter as the denominator. CONTRAST:  89mL OMNIPAQUE IOHEXOL 350 MG/ML SOLN COMPARISON:  MRI and  CT earlier same day. FINDINGS: CTA NECK FINDINGS Aortic arch: Aortic atherosclerotic calcification. Branching pattern is normal. Severe stenosis of the proximal left subclavian artery because of calcified plaque, 80% or greater. Right carotid system: Common carotid artery shows scattered calcified plaque but is sufficiently patent to the bifurcation region. No soft or calcified plaque at the carotid bifurcation or ICA bulb. Cervical ICA widely patent. Left carotid system: Common carotid artery shows scattered plaque but is sufficiently patent to the bifurcation. Calcified plaque at the carotid bifurcation and ICA bulb. Minimal diameter in the ICA bulb 1 mm. Compared to a more distal cervical ICA diameter of 4 mm, which may actually be flow reduced, this indicates  of stenosis of at least 75%. Cervical ICA widely patent beyond that. Vertebral arteries: 30% narrowing at the right vertebral artery origin. Beyond that, the right vertebral artery is widely patent to the foramen magnum. As noted above, there is severe stenosis of the proximal left subclavian artery just beyond its origin from the arch. There is 30% narrowing at the non dominant left vertebral artery origin. Focal calcified plaque 2 cm beyond the origin results in 30% stenosis. Beyond that, the vessels show some scattered plaque but is sufficiently patent to the foramen magnum. Skeleton: Mid cervical spondylosis.  Bilateral facet osteoarthritis. Other neck: No lymphadenopathy. Enlarged heterogeneous thyroid gland. Largest nodule on the left measures up to 3 cm in diameter. Upper chest: Negative Review of the MIP images confirms the above findings CTA HEAD FINDINGS Anterior circulation: Both internal carotid arteries are patent through the skull base and siphon regions. Ordinary siphon atherosclerotic calcification but without stenosis greater than 30%. The anterior and middle cerebral vessels are patent. No large or medium vessel occlusion. No aneurysm or  vascular malformation. Posterior circulation: Both vertebral arteries are patent through the foramen magnum to the basilar. No basilar stenosis. Posterior circulation branch vessels are patent. No significant proximal stenosis. Venous sinuses: Patent and normal. Anatomic variants: None other significant. Review of the MIP images confirms the above findings IMPRESSION: Aortic atherosclerosis. Severe stenosis of the proximal left subclavian artery, 80% or greater. CT angiography is not able to establish flow direction in the left vertebral artery and subclavian steal is not excluded. Doppler ultrasound could accomplish that. No carotid bifurcation stenosis on the right. Advanced atherosclerotic change at the left ICA origin and bulb. Minimal diameter is 1 mm. This represents at least a 75% stenosis. 30% stenosis of both vertebral artery origins. Second 30% stenosis of the left vertebral artery 2 cm beyond the origin. No intracranial large or medium vessel occlusion. No left MCA territory abnormality visible by CT angiography. Enlarged heterogeneous thyroid with multiple nodules, the largest measuring up to 3 cm on the left. Recommend thyroid US (ref: J Am Coll Radiol. 2015 Feb;12(2): 143-50). Electronically Signed   By: Nelson Chimes M.D.   On: 10/26/2020 16:53   Assessment/Plan Angela Bowen is a 80 year old female with medical history significant for hypertension, GERD, diverticulosis, osteoporosis and dyslipidemia who presents to the emergency room via private vehicle for evaluation of right leg weakness which she has had for about 3 days.  Acute CVA with significant carotid disease.  1.  Carotid Artery Stenosis: Patient presented to the [facility] Emergency Department complaining of progressively worsening right sided weakness which was occurring for approximately three days. Patient found to have acute CVA in the setting of severe left internal carotid artery as well as proximal left  subclavian artery stenosis. Patient is on aspirin, Lipitor, and Plavix. Vascular surgery was consulted for repair.  In the setting of an acute CVA any repair whether it be stent or open will have to wait for approximately 3 weeks. I did have a long discussion in regards to open versus stenting. Patient and her family would like to proceed with stenting.  Patient was directed to continue to take her aspirin and Plavix in preparation for stent.  Our office will reach out to the patient and her family in regard to a date and time.  2.  Hypertension: On appropriate medications. Encouraged good control as its slows the progression of atherosclerotic disease.  3.  GERD: On appropriate medications. Encouraged  good control as its slows the progression of atherosclerotic disease.  Discussed with Dr. Mayme Genta, PA-C  10/28/2020 2:25 PM  This note was created with Dragon medical transcription system.  Any error is purely unintentional.

## 2020-10-29 ENCOUNTER — Telehealth: Payer: Self-pay

## 2020-10-29 NOTE — Telephone Encounter (Signed)
Transition Care Management Follow-up Telephone Call  Date of discharge and from where: 10/28/2020 [facility]  How have you been since you were released from the hospital? Feeling better. Still a little a wobbly  Any questions or concerns? No  Items Reviewed:  Did the pt receive and understand the discharge instructions provided? Yes   Medications obtained and verified? Yes   Other? No   Any new allergies since your discharge? No   Dietary orders reviewed? Yes  Do you have support at home? Yes   Home Care and Equipment/Supplies: Were home health services ordered? not applicable If so, what is the name of the agency? n/a  Has the agency set up a time to come to the patient's home? not applicable Were any new equipment or medical supplies ordered?  No What is the name of the medical supply agency? n/a Were you able to get the supplies/equipment? not applicable Do you have any questions related to the use of the equipment or supplies? No  Functional Questionnaire: (I = Independent and D = Dependent) ADLs: I  Bathing/Dressing- I  Meal Prep- I  Eating- I  Maintaining continence- I  Transferring/Ambulation- I  Managing Meds- I  Follow up appointments reviewed:   PCP Hospital f/u appt confirmed? Yes  Scheduled to see Dr. Silvio Pate on 11/04/2020 @ 12:00.  Are transportation arrangements needed? No   If their condition worsens, is the pt aware to call PCP or go to the Emergency Dept.? Yes  Was the patient provided with contact information for the PCP's office or ED? Yes  Was to pt encouraged to call back with questions or concerns? Yes

## 2020-10-30 ENCOUNTER — Encounter: Payer: Self-pay | Admitting: Internal Medicine

## 2020-11-03 ENCOUNTER — Telehealth (INDEPENDENT_AMBULATORY_CARE_PROVIDER_SITE_OTHER): Payer: Self-pay

## 2020-11-03 NOTE — Telephone Encounter (Signed)
Spoke with the patient and she is scheduled with Dr. Lucky Cowboy for a left carotid stent placement on 11/16/20 with a 6:45 am arrival time to the [facility]. Covid testing on 11/12/20 between 8-12 pm at the [facility]. Pre-procedure instructions were discussed and will be mailed.

## 2020-11-04 ENCOUNTER — Other Ambulatory Visit: Payer: Self-pay

## 2020-11-04 ENCOUNTER — Encounter: Payer: Self-pay | Admitting: Internal Medicine

## 2020-11-04 ENCOUNTER — Ambulatory Visit (INDEPENDENT_AMBULATORY_CARE_PROVIDER_SITE_OTHER): Payer: Medicare Other | Admitting: Internal Medicine

## 2020-11-04 DIAGNOSIS — E042 Nontoxic multinodular goiter: Secondary | ICD-10-CM

## 2020-11-04 DIAGNOSIS — Z8673 Personal history of transient ischemic attack (TIA), and cerebral infarction without residual deficits: Secondary | ICD-10-CM

## 2020-11-04 DIAGNOSIS — I6522 Occlusion and stenosis of left carotid artery: Secondary | ICD-10-CM

## 2020-11-04 DIAGNOSIS — I10 Essential (primary) hypertension: Secondary | ICD-10-CM | POA: Diagnosis not present

## 2020-11-04 NOTE — Assessment & Plan Note (Signed)
BP Readings from Last 3 Encounters:  11/04/20 118/72  10/28/20 (!) 143/45  01/20/20 (!) 150/70   Good control on the benazepril/HCTZ

## 2020-11-04 NOTE — Progress Notes (Signed)
Subjective:    Patient ID: Angela Bowen, female    DOB: [birth year], 80 y.o.   MRN: [id number]  HPI Here with husband for hospital follow up This visit occurred during the SARS-CoV-2 public health emergency.  Safety protocols were in place, including screening questions prior to the visit, additional usage of staff PPE, and extensive cleaning of exam room while observing appropriate contact time as indicated for disinfecting solutions.   Reviewed hospital records, discharge summary and consultations Woke unsteady and dragging right foot She mentioned some concerns to her husband Had been moving and very tired, etc Symptoms persisted---so called here. No appointments. Tried to get into urgent care--but sent to ER  MRI showed multiple punctate infarcts TEE showed not embolic focus Left carotid artery with significant stenosis. Seen by Dr Lucky Cowboy and now is scheduled for carotid stenting in ~2 weeks  Right foot weakness did improve No persistent neurologic symptoms--but "I know that my foot needs some exercise"  Started on plavix/ASA and statin Same BP med  Reviewed CT scan Has incidental thyroid nodules  Current Outpatient Medications on File Prior to Visit  Medication Sig Dispense Refill  . aspirin EC 81 MG EC tablet Take 1 tablet (81 mg total) by mouth daily. Swallow whole. 30 tablet 0  . atorvastatin (LIPITOR) 20 MG tablet Take 2 tablets (40 mg total) by mouth at bedtime. 30 tablet 0  . benazepril-hydrochlorthiazide (LOTENSIN HCT) 10-12.5 MG tablet TAKE 1 TABLET DAILY 90 tablet 3  . clopidogrel (PLAVIX) 75 MG tablet Take 1 tablet (75 mg total) by mouth daily. 30 tablet 0  . EPINEPHrine 0.3 mg/0.3 mL IJ SOAJ injection Inject 0.3 mLs (0.3 mg total) into the skin as needed. 2 each 1  . Multiple Vitamin (MULTIVITAMIN) tablet Take 1 tablet by mouth daily.    . pantoprazole (PROTONIX) 40 MG tablet Take 1 tablet (40 mg total) by mouth daily before breakfast. 30 tablet 0  .  [DISCONTINUED] Calcium-Magnesium (CALCIUM MAGNESIUM 750) 300-300 MG TABS Take by mouth daily.      No current facility-administered medications on file prior to visit.    Allergies  Allergen Reactions  . Meat [Alpha-Gal]     RED MEAT-CAUSES SEVERE HIVES,SOB  . Aspirin     High doses - intolerant/causes hives only at high doses, tolerates 81 mg daily well  . Codeine     REACTION: rash    Past Medical History:  Diagnosis Date  . Cancer ([place])    stage II melanoma; right leg;face  . Diverticulosis of colon   . Gallstones   . GERD (gastroesophageal reflux disease)   . History of skin cancer   . Hyperlipidemia   . Hypertension   . Normal stress echocardiogram 01/2006   echo 5/98, fairly normal  . Osteoarthritis   . Osteoporosis, unspecified   . S/P sclerotherapy of varicose veins 07/30/06   left leg, Dr. Eilleen Kempf  . Skin cancer    STAGE II ON RIGHT LEG    Past Surgical History:  Procedure Laterality Date  . CHOLECYSTECTOMY  2001  . FOOT SURGERY     right  . MELANOMA EXCISION  7/10   leg  . MOHS SURGERY    . TEE WITHOUT CARDIOVERSION N/A 10/28/2020   Procedure: TRANSESOPHAGEAL ECHOCARDIOGRAM (TEE);  Surgeon: Corey Skains, MD;  Location: [facility] ORS;  Service: Cardiovascular;  Laterality: N/A;  . [place]   bilateral  . VARICOSE VEIN SURGERY  2004   right foot  Family History  Problem Relation Age of Onset  . Alcohol abuse Mother   . Colon cancer Mother   . Osteoporosis Mother   . Alcohol abuse Father   . Diabetes Father   . Lung cancer Brother   . Ovarian cancer Maternal Grandmother   . Breast cancer Neg Hx     Social History   Socioeconomic History  . Marital status: Married    Spouse name: Not on file  . Number of children: 2  . Years of education: Not on file  . Highest education level: Not on file  Occupational History  . Occupation: homemaker  Tobacco Use  . Smoking status: Never Smoker  . Smokeless tobacco: Never Used   Substance and Sexual Activity  . Alcohol use: Yes    Alcohol/week: 0.0 standard drinks    Comment: Wine, rarely  . Drug use: Never  . Sexual activity: Never  Other Topics Concern  . Not on file  Social History Narrative   Has living will   No designated health care POA--requests husband. Alternate would be son [name]   Would accept resuscitation attempts   No tube feeds if cognitively unaware   Social Determinants of Health   Financial Resource Strain: Not on file  Food Insecurity: Not on file  Transportation Needs: Not on file  Physical Activity: Not on file  Stress: Not on file  Social Connections: Not on file  Intimate Partner Violence: Not on file   Review of Systems Sleeps okay Appetite is improving No depression --some anxiety after the stroke (but generally doing okay)    Objective:   Physical Exam Constitutional:      Appearance: Normal appearance.  Cardiovascular:     Rate and Rhythm: Normal rate and regular rhythm.     Heart sounds: No murmur heard. No gallop.   Pulmonary:     Effort: Pulmonary effort is normal.     Breath sounds: Normal breath sounds. No wheezing or rales.  Musculoskeletal:     Cervical back: Neck supple.     Right lower leg: No edema.     Left lower leg: No edema.  Lymphadenopathy:     Cervical: No cervical adenopathy.  Skin:    Findings: No rash.  Neurological:     Mental Status: She is alert and oriented to person, place, and time.     Cranial Nerves: No cranial nerve deficit.     Motor: No weakness, tremor or abnormal muscle tone.     Coordination: Romberg sign negative. Coordination normal.     Gait: Gait normal.            Assessment & Plan:

## 2020-11-04 NOTE — Assessment & Plan Note (Signed)
Classic embolic presentation on CT angiogram Carotid stenosis found---will have stent On ASA/plavix Statin BP good

## 2020-11-04 NOTE — Assessment & Plan Note (Signed)
Has stenting scheduled with Dr Lucky Cowboy on 5/23 Discussed clarifying when to stop the plavix and ASA

## 2020-11-04 NOTE — Assessment & Plan Note (Signed)
Will need ultrasound to decide if biopsy needed No rush---she wishes to wait for now

## 2020-11-05 ENCOUNTER — Encounter: Payer: Self-pay | Admitting: Medical

## 2020-11-05 ENCOUNTER — Ambulatory Visit (INDEPENDENT_AMBULATORY_CARE_PROVIDER_SITE_OTHER): Payer: Medicare Other | Admitting: Medical

## 2020-11-05 ENCOUNTER — Ambulatory Visit (INDEPENDENT_AMBULATORY_CARE_PROVIDER_SITE_OTHER): Payer: Medicare Other

## 2020-11-05 VITALS — BP 118/60 | HR 65 | Ht 60.0 in | Wt 107.0 lb

## 2020-11-05 DIAGNOSIS — I6522 Occlusion and stenosis of left carotid artery: Secondary | ICD-10-CM

## 2020-11-05 DIAGNOSIS — R9431 Abnormal electrocardiogram [ECG] [EKG]: Secondary | ICD-10-CM | POA: Diagnosis not present

## 2020-11-05 DIAGNOSIS — I6389 Other cerebral infarction: Secondary | ICD-10-CM

## 2020-11-05 DIAGNOSIS — R072 Precordial pain: Secondary | ICD-10-CM

## 2020-11-05 DIAGNOSIS — I639 Cerebral infarction, unspecified: Secondary | ICD-10-CM

## 2020-11-05 DIAGNOSIS — I10 Essential (primary) hypertension: Secondary | ICD-10-CM

## 2020-11-05 DIAGNOSIS — E782 Mixed hyperlipidemia: Secondary | ICD-10-CM | POA: Diagnosis not present

## 2020-11-05 NOTE — Patient Instructions (Addendum)
Medication Instructions:  Your physician recommends that you continue on your current medications as directed. Please refer to the Current Medication list given to you today.  *If you need a refill on your cardiac medications before your next appointment, please call your pharmacy*  Testing/Procedures:  [place]  Your caregiver has ordered a Stress Test with nuclear imaging. The purpose of this test is to evaluate the blood supply to your heart muscle. This procedure is referred to as a "Non-Invasive Stress Test." This is because other than having an IV started in your vein, nothing is inserted or "invades" your body. Cardiac stress tests are done to find areas of poor blood flow to the heart by determining the extent of coronary artery disease (CAD). Some patients exercise on a treadmill, which naturally increases the blood flow to your heart, while others who are  unable to walk on a treadmill due to physical limitations have a pharmacologic/chemical stress agent called Lexiscan . This medicine will mimic walking on a treadmill by temporarily increasing your coronary blood flow.   Please note: these test may take anywhere between 2-4 hours to complete  PLEASE REPORT TO [facility] AT THE FIRST DESK WILL DIRECT YOU WHERE TO GO  Date of Procedure:_____________________________________  Arrival Time for Procedure:______________________________    PLEASE NOTIFY THE OFFICE AT LEAST 24 HOURS IN ADVANCE IF YOU ARE UNABLE TO KEEP YOUR APPOINTMENT.  [phone] AND  PLEASE NOTIFY NUCLEAR MEDICINE AT [facility] AT LEAST 24 HOURS IN ADVANCE IF YOU ARE UNABLE TO KEEP YOUR APPOINTMENT. [phone]  How to prepare for your Myoview test:  1. Do not eat or drink after midnight 2. No caffeine for 24 hours prior to test 3. No smoking 24 hours prior to test. 4. Your medication may be taken with water.  If your doctor stopped a medication because of this test, do not take  that medication. 5. Ladies, please do not wear dresses.  Skirts or pants are appropriate. Please wear a short sleeve shirt. 6. No perfume, cologne or lotion. 7. Wear comfortable walking shoes. No heels!    ZIO XT- Long Term Monitor Instructions   Your physician has requested you wear a ZIO patch monitor for _14__ days. XT This is a single patch monitor.   [employer] supplies one patch monitor per enrollment. Additional stickers are not available. Please do not apply patch if you will be having a Nuclear Stress Test, Echocardiogram, Cardiac CT, MRI, or Chest Xray during the period you would be wearing the monitor. The patch cannot be worn during these tests. You cannot remove and re-apply the ZIO XT patch monitor.  Your ZIO patch monitor will be sent Fed Ex from [employer] directly to your home address. It may take 3-5 days to receive your monitor after you have been enrolled.  Once you have received your monitor, please review the enclosed instructions. Your monitor has already been registered assigning a specific monitor serial # to you.  Billing and Patient Assistance Program Information   We have supplied [employer] with any of your insurance information on file for billing purposes. [employer] offers a sliding scale Patient Assistance Program for patients that do not have insurance, or whose insurance does not completely cover the cost of the ZIO monitor.   You must apply for the Patient Assistance Program to qualify for this discounted rate.     To apply, please call [employer] at [phone], select option 4, then select option 2, and  ask to apply for Patient Assistance Program.  Theodore Demark will ask your household income, and how many people are in your household.  They will quote your out-of-pocket cost based on that information.  [employer] will also be able to set up a 57-month, interest-free payment plan if needed.  Applying the monitor   Shave hair from upper left chest.  Hold abrader disc  by orange tab. Rub abrader in 40 strokes over the upper left chest as indicated in your monitor instructions.  Clean area with 4 enclosed alcohol pads. Let dry.  Apply patch as indicated in monitor instructions. Patch will be placed under collarbone on left side of chest with arrow pointing upward.  Rub patch adhesive wings for 2 minutes. Remove white label marked "1". Remove the white label marked "2". Rub patch adhesive wings for 2 additional minutes.  While looking in a mirror, press and release button in center of patch. A small green light will flash 3-4 times. This will be your only indicator that the monitor has been turned on. ?  Do not shower for the first 24 hours. You may shower after the first 24 hours.  Press the button if you feel a symptom. You will hear a small click. Record Date, Time and Symptom in the Patient Logbook.  When you are ready to remove the patch, follow instructions on the last 2 pages of the Patient Logbook. Stick patch monitor onto the last page of Patient Logbook.  Place Patient Logbook in the blue and white box.  Use locking tab on box and tape box closed securely.  The blue and white box has prepaid postage on it. Please place it in the mailbox as soon as possible. Your physician should have your test results approximately 7 days after the monitor has been mailed back to [facility].  Call [place] at [phone] if you have questions regarding your ZIO XT patch monitor. Call them immediately if you see an orange light blinking on your monitor.  If your monitor falls off in less than 4 days, contact our Monitor department at [phone]. ?If your monitor becomes loose or falls off after 4 days call [employer] at [phone] for suggestions on securing your monitor.?    Follow-Up: At [facility], you and your health needs are our priority.  As part of our continuing mission to provide you with exceptional heart care, we have created  designated Provider Care Teams.  These Care Teams include your primary Cardiologist (physician) and Advanced Practice Providers (APPs -  Physician Assistants and Nurse Practitioners) who all work together to provide you with the care you need, when you need it.  We recommend signing up for the patient portal called "MyChart".  Sign up information is provided on this After Visit Summary.  MyChart is used to connect with patients for Virtual Visits (Telemedicine).  Patients are able to view lab/test results, encounter notes, upcoming appointments, etc.  Non-urgent messages can be sent to your provider as well.   To learn more about what you can do with MyChart, go to [email or web address].    Your next appointment:   3 month(s)  The format for your next appointment:   In Person  Provider:   You may see Kate Sable, MD or one of the following Advanced Practice Providers on your designated Care Team:    Murray Hodgkins, NP  Christell Faith, PA-C  Marrianne Mood, PA-C  Cadence Welcome, [place]  Laurann Montana, NP    Other Instructions  You have been referred to Electrophysiology to discuss Loop Recorder.

## 2020-11-05 NOTE — Progress Notes (Signed)
Cardiology Office Note:    Date:  11/05/2020   ID:  Angela Bowen, DOB Feb 23, 1941, MRN [id number]  PCP:  Angela Carbon, MD  [facility] Cardiologist:  Angela Sable, MD  [place] Electrophysiologist:  None   Referring MD: Angela Carbon, MD   Chief Complaint: Hospital follow-up  History of Present Illness:    Angela Bowen is a 80 y.o. female with a hx of hypertension, GERD, diverticulosis, osteoporosis, hyperlipidemia who presents for hospital follow-up.  Patient was admitted 5/2 - 5/4 for acute CVA.  Presented with right lower extremity weakness for 3 days.  MRI showed several scattered punctuate and subcentimeter acute infarcts at the left posterior frontal and parietal vertex affecting cortical and subcortical brain consistent with microembolic infarctions of the left MCA territory. CT angio of the head and neck were concerning for left carotid stenosis and subclavian stenosis.  Echo showed no source of emboli. Vascular was consulted for symptomatic left carotid stenosis. She was seen by Dr. Nehemiah Bowen during hospitalization. Suspected primary source of embolic stroke due to left carotid atherosclerosis but also potential source of transverse aortic arch atherosclerosis. No evidence of embolic source from a cardiac standpoint based off of TEE.  She was started on dual antiplatelet therapy.  Vascular planning for left carotid stent intervention.  Today, the patient has no specific concerns. She has a dull headache, can take tylenol. Saw PCP yesterday and they are wondering about plavix and aspirin. She is scheduled for left stent intervention with Dr. Lucky Bowen on Nov 16, 2020. Consult note in the hospital says to continue aspirin and plavix perioperatively. She is still having atypical back pain. Also might have arthritis in the spine. Hurts worse when she stands for a long time or does something. Worse with sweeping. No SOB with the back pain. No chest pain. No LLE,  orthopnea, pnd. No nausea, vomiting, fever, chills. BP good today. EKG has some TW changes, but has been seen on prior EKG. No prior ischemic evaluation.   Past Medical History:  Diagnosis Date  . Cancer ([place])    stage II melanoma; right leg;face  . Diverticulosis of colon   . Gallstones   . GERD (gastroesophageal reflux disease)   . History of skin cancer   . Hyperlipidemia   . Hypertension   . Normal stress echocardiogram 01/2006   echo 5/98, fairly normal  . Osteoarthritis   . Osteoporosis, unspecified   . S/P sclerotherapy of varicose veins 07/30/06   left leg, Dr. Eilleen Kempf  . Skin cancer    STAGE II ON RIGHT LEG    Past Surgical History:  Procedure Laterality Date  . CHOLECYSTECTOMY  2001  . FOOT SURGERY     right  . MELANOMA EXCISION  7/10   leg  . MOHS SURGERY    . TEE WITHOUT CARDIOVERSION N/A 10/28/2020   Procedure: TRANSESOPHAGEAL ECHOCARDIOGRAM (TEE);  Surgeon: Corey Skains, MD;  Location: [facility] ORS;  Service: Cardiovascular;  Laterality: N/A;  . [place]   bilateral  . VARICOSE VEIN SURGERY  2004   right foot    Current Medications: Current Meds  Medication Sig  . aspirin EC 81 MG EC tablet Take 1 tablet (81 mg total) by mouth daily. Swallow whole.  Marland Kitchen atorvastatin (LIPITOR) 20 MG tablet Take 2 tablets (40 mg total) by mouth at bedtime.  . benazepril-hydrochlorthiazide (LOTENSIN HCT) 10-12.5 MG tablet TAKE 1 TABLET DAILY  . clopidogrel (PLAVIX) 75 MG tablet Take 1  tablet (75 mg total) by mouth daily.  Marland Kitchen EPINEPHrine 0.3 mg/0.3 mL IJ SOAJ injection Inject 0.3 mLs (0.3 mg total) into the skin as needed.  . Multiple Vitamin (MULTIVITAMIN) tablet Take 1 tablet by mouth daily.  . pantoprazole (PROTONIX) 40 MG tablet Take 1 tablet (40 mg total) by mouth daily before breakfast.     Allergies:   Meat [alpha-gal], Aspirin, and Codeine   Social History   Socioeconomic History  . Marital status: Married    Spouse name: Not on file  . Number of  children: 2  . Years of education: Not on file  . Highest education level: Not on file  Occupational History  . Occupation: homemaker  Tobacco Use  . Smoking status: Never Smoker  . Smokeless tobacco: Never Used  Substance and Sexual Activity  . Alcohol use: Yes    Alcohol/week: 0.0 standard drinks    Comment: Wine, rarely  . Drug use: Never  . Sexual activity: Never  Other Topics Concern  . Not on file  Social History Narrative   Has living will   No designated health care POA--requests husband. Alternate would be son Angela Bowen   Would accept resuscitation attempts   No tube feeds if cognitively unaware   Social Determinants of Health   Financial Resource Strain: Not on file  Food Insecurity: Not on file  Transportation Needs: Not on file  Physical Activity: Not on file  Stress: Not on file  Social Connections: Not on file     Family History: The patient's family history includes Alcohol abuse in her father and mother; Colon cancer in her mother; Diabetes in her father; Lung cancer in her brother; Osteoporosis in her mother; Ovarian cancer in her maternal grandmother. There is no history of Breast cancer.  ROS:   Please see the history of present illness.     All other systems reviewed and are negative.  EKGs/Labs/Other Studies Reviewed:    The following studies were reviewed today: Echo TEE 10/28/20 1. Left ventricular ejection fraction, by estimation, is 60 to 65%. The  left ventricle has normal function. The left ventricle has no regional  wall motion abnormalities.  2. Right ventricular systolic function is normal. The right ventricular  size is normal.  3. No left atrial/left atrial appendage thrombus was detected.  4. The mitral valve is normal in structure. Mild mitral valve  regurgitation.  5. The aortic valve is normal in structure. Aortic valve regurgitation is  not visualized.  6. There is Moderate (Grade III) protruding plaque involving the   transverse aorta and descending aorta.  7. Agitated saline contrast bubble study was negative, with no evidence  of any interatrial shunt.   Echo 10/27/20 1. Left ventricular ejection fraction, by estimation, is 60 to 65%. The  left ventricle has normal function. Left ventricular endocardial border  not optimally defined to evaluate regional wall motion. Left ventricular  diastolic parameters are consistent  with Grade I diastolic dysfunction (impaired relaxation).  2. Right ventricular systolic function is normal. The right ventricular  size is normal.  3. The mitral valve is grossly normal. Trivial mitral valve  regurgitation. No evidence of mitral stenosis.  4. The aortic valve has an indeterminant number of cusps. There is mild  calcification of the aortic valve. There is mild thickening of the aortic  valve. Aortic valve regurgitation is not visualized. Mild to moderate  aortic valve sclerosis/calcification  is present, without any evidence of aortic stenosis.  5. The inferior  vena cava is normal in size with greater than 50%  respiratory variability, suggesting right atrial pressure of 3 mmHg.   EKG:  EKG is ordered today.  The ekg ordered today demonstrates NSR, 65bpm, PRI 178ms, TWI anterolateral leads  Recent Labs: 10/26/2020: ALT 18 10/27/2020: TSH 3.375 10/28/2020: BUN 12; Creatinine, Ser 0.66; Hemoglobin 13.7; Platelets 277; Potassium 4.7; Sodium 139  Recent Lipid Panel    Component Value Date/Time   CHOL 156 10/27/2020 0400   TRIG 96 10/27/2020 0400   HDL 34 (L) 10/27/2020 0400   CHOLHDL 4.6 10/27/2020 0400   VLDL 19 10/27/2020 0400   LDLCALC 103 (H) 10/27/2020 0400   LDLDIRECT 121.8 01/30/2012 1240     Physical Exam:    VS:  BP 118/60 (BP Location: Left Arm, Patient Position: Sitting, Cuff Size: Normal)   Pulse 65   Ht 5' (1.524 m)   Wt 107 lb (48.5 kg)   SpO2 97%   BMI 20.90 kg/m     Wt Readings from Last 3 Encounters:  11/05/20 107 lb (48.5 kg)   11/04/20 107 lb (48.5 kg)  10/28/20 110 lb (49.9 kg)     GEN:  Well nourished, well developed in no acute distress HEENT: Normal NECK: No JVD; +L carotid bruit LYMPHATICS: No lymphadenopathy CARDIAC: RRR, no murmurs, rubs, gallops RESPIRATORY:  Clear to auscultation without rales, wheezing or rhonchi  ABDOMEN: Soft, non-tender, non-distended MUSCULOSKELETAL:  No edema; No deformity  SKIN: Warm and dry NEUROLOGIC:  Alert and oriented x 3 PSYCHIATRIC:  Normal affect   ASSESSMENT:    1. Cerebrovascular accident (CVA), unspecified mechanism ([place])   2. Abnormal EKG   3. Precordial pain   4. EKG, abnormal   5. Stenosis of left carotid artery   6. Essential hypertension   7. Hyperlipidemia, mixed    PLAN:    In order of problems listed above:  Acute CVA Felt to be embolic vs left carotid stenosis.Echo showed normal LVEF. TEE showed no thrombus. Plan for LCA stent May 23rd with [facility]. She has no residual weakness. I will order a 2 week heart monitor to evaluate for afib. Also will refer to EP for possible ILR. Continue Aspirin, plavix and statin. Plan for lifelong aspirin for secondary prevention. Following with PCP. Does not have neurologist.    Carotid stenosis, left Seen by [facility] in the hospital and plan for left carotid stent 5/23. Continue Aspirin and plavix perioperatively per hospital notes.  HTN BP wnl today. Continue current medicaitons  HLD Continue statin. LDL goal<70.  Atypical back pain Same pain when she saw MD in 12/2019. Sounds MSK in nature given that it is worse with certain movements. EKG with TW changes, which has been seen on prior EKGs. She has not had prior ischemic evaluation. Given abnormal EKG and atypical symptoms I will get Lexiscan Myoview early next week.   Disposition: Follow up in 3 month(s) with MD   Shared Decision Making/Informed Consent   Shared Decision Making/Informed Consent The risks [chest pain, shortness of breath, cardiac arrhythmias,  dizziness, blood pressure fluctuations, myocardial infarction, stroke/transient ischemic attack, nausea, vomiting, allergic reaction, radiation exposure, metallic taste sensation and life-threatening complications (estimated to be 1 in 10,000)], benefits (risk stratification, diagnosing coronary artery disease, treatment guidance) and alternatives of a nuclear stress test were discussed in detail with Ms. Tworek and she agrees to proceed.       Signed, Satoshi Kalas Ninfa Meeker, PA-C  11/05/2020 4:04 PM    [facility]

## 2020-11-09 ENCOUNTER — Other Ambulatory Visit: Payer: Self-pay

## 2020-11-09 ENCOUNTER — Ambulatory Visit
Admission: RE | Admit: 2020-11-09 | Discharge: 2020-11-09 | Disposition: A | Discharge status: Home / Self Care | Payer: Medicare Other | Source: Ambulatory Visit | Attending: Medical | Admitting: Medical

## 2020-11-09 DIAGNOSIS — R072 Precordial pain: Secondary | ICD-10-CM | POA: Insufficient documentation

## 2020-11-09 LAB — NM MYOCAR MULTI W/SPECT W/WALL MOTION / EF
Estimated workload: 1 METS
Exercise duration (min): 0 min
Exercise duration (sec): 0 s
LV dias vol: 46 mL (ref 46–106)
LV sys vol: 9 mL
MPHR: 140 {beats}/min
Peak HR: 116 {beats}/min
Percent HR: 82 %
Rest HR: 57 {beats}/min
SDS: 2
SRS: 1
SSS: 1
TID: 0.92

## 2020-11-09 MED ORDER — TECHNETIUM TC 99M TETROFOSMIN IV KIT
10.0000 | PACK | Freq: Once | INTRAVENOUS | Status: AC | PRN
  Administered 2020-11-09: 10.38 via INTRAVENOUS

## 2020-11-09 MED ORDER — REGADENOSON 0.4 MG/5ML IV SOLN
0.4000 mg | Freq: Once | INTRAVENOUS | Status: AC
  Administered 2020-11-09: 0.4 mg via INTRAVENOUS

## 2020-11-09 MED ORDER — TECHNETIUM TC 99M TETROFOSMIN IV KIT
30.0000 | PACK | Freq: Once | INTRAVENOUS | Status: AC | PRN
  Administered 2020-11-09: 32.9 via INTRAVENOUS

## 2020-11-12 ENCOUNTER — Other Ambulatory Visit: Admission: RE | Admit: 2020-11-12 | Payer: Medicare Other | Source: Ambulatory Visit

## 2020-11-13 ENCOUNTER — Other Ambulatory Visit: Admission: RE | Admit: 2020-11-13 | Payer: Medicare Other | Source: Ambulatory Visit

## 2020-11-13 ENCOUNTER — Other Ambulatory Visit: Payer: Self-pay

## 2020-11-13 ENCOUNTER — Telehealth (INDEPENDENT_AMBULATORY_CARE_PROVIDER_SITE_OTHER): Payer: Self-pay | Admitting: Vascular Surgery

## 2020-11-13 ENCOUNTER — Other Ambulatory Visit
Admission: RE | Admit: 2020-11-13 | Discharge: 2020-11-13 | Disposition: A | Discharge status: Home / Self Care | Payer: Medicare Other | Source: Ambulatory Visit | Attending: Vascular Surgery | Admitting: Vascular Surgery

## 2020-11-13 DIAGNOSIS — Z01812 Encounter for preprocedural laboratory examination: Secondary | ICD-10-CM | POA: Insufficient documentation

## 2020-11-13 DIAGNOSIS — Z20822 Contact with and (suspected) exposure to covid-19: Secondary | ICD-10-CM | POA: Insufficient documentation

## 2020-11-13 NOTE — Telephone Encounter (Signed)
Pt called in stating that she needed to know what was going on with her COVID testing. States that she went to the appt that Elta Guadeloupe had made her for yesterday 5.19.22. Pt states that she went to the appt yesterday at [facility] and "they" stated that she didn't have to take a COVID test She said that 2 people told her that. Pt states that someone form this office called her this morning and said that she could go day of the surgery and have it done, Mickel Baas is out of the office today and I asked everyone else in the office and they did not call her. I advised the pt to check her recent call log to see who it was that called her. When looking in her chart, the appt for yesterday is cancelled and there is a new appt made for today. I do not know who made these appts. I advised the pt to call the hospital and find out and I stated to her that she WILL HAVE to have a COVID test before her surgery on Monday. I advised her to make sure that the hospital knows that she is having surgery, not just wanting a test. Pt states that husband is getting mad and at $4.20 a gallon, she can't drive up here everyday. I again stated that she would HAVE TO HAVE the COVID test or face the cancellation of surgery. I gave her the # to [facility] and asked her to please explain that she was having surgery and that she should go to thew appt that was made for her today.

## 2020-11-13 NOTE — Progress Notes (Signed)
I looking over patient chart for Mondays procedure, noticed that there was no COVID test. Pre-admit testing confirmed it was not completed. Called patient to inquire, patient stated she came to [facility] yesterday and was told by staff that COVID test was not necessary, and so they cancelled the test and sent her home. Patient was upset. Apologized for the misinformation and inconvenience and told patient that we would COVID test her when she arrived for her procedure on Monday.

## 2020-11-14 LAB — SARS CORONAVIRUS 2 (TAT 6-24 HRS): SARS Coronavirus 2: NEGATIVE

## 2020-11-16 ENCOUNTER — Encounter: Payer: Self-pay | Admitting: Vascular Surgery

## 2020-11-16 ENCOUNTER — Inpatient Hospital Stay
Admission: RE | Admit: 2020-11-16 | Discharge: 2020-11-19 | DRG: 036 | Disposition: A | Discharge status: Home / Self Care | Payer: Medicare Other | Attending: Vascular Surgery | Admitting: Vascular Surgery

## 2020-11-16 ENCOUNTER — Encounter
Admission: RE | Disposition: A | Discharge status: Home / Self Care | Payer: Self-pay | Source: Home / Self Care | Attending: Vascular Surgery

## 2020-11-16 ENCOUNTER — Other Ambulatory Visit (INDEPENDENT_AMBULATORY_CARE_PROVIDER_SITE_OTHER): Payer: Self-pay | Admitting: Nurse Practitioner

## 2020-11-16 DIAGNOSIS — Z9049 Acquired absence of other specified parts of digestive tract: Secondary | ICD-10-CM | POA: Diagnosis not present

## 2020-11-16 DIAGNOSIS — Z885 Allergy status to narcotic agent status: Secondary | ICD-10-CM | POA: Diagnosis not present

## 2020-11-16 DIAGNOSIS — E785 Hyperlipidemia, unspecified: Secondary | ICD-10-CM | POA: Diagnosis present

## 2020-11-16 DIAGNOSIS — Z79899 Other long term (current) drug therapy: Secondary | ICD-10-CM | POA: Diagnosis not present

## 2020-11-16 DIAGNOSIS — K219 Gastro-esophageal reflux disease without esophagitis: Secondary | ICD-10-CM | POA: Diagnosis present

## 2020-11-16 DIAGNOSIS — Z20822 Contact with and (suspected) exposure to covid-19: Secondary | ICD-10-CM | POA: Diagnosis present

## 2020-11-16 DIAGNOSIS — R001 Bradycardia, unspecified: Secondary | ICD-10-CM | POA: Diagnosis present

## 2020-11-16 DIAGNOSIS — Z7902 Long term (current) use of antithrombotics/antiplatelets: Secondary | ICD-10-CM

## 2020-11-16 DIAGNOSIS — Z8673 Personal history of transient ischemic attack (TIA), and cerebral infarction without residual deficits: Secondary | ICD-10-CM

## 2020-11-16 DIAGNOSIS — I6522 Occlusion and stenosis of left carotid artery: Principal | ICD-10-CM | POA: Diagnosis present

## 2020-11-16 DIAGNOSIS — I6502 Occlusion and stenosis of left vertebral artery: Secondary | ICD-10-CM | POA: Diagnosis present

## 2020-11-16 DIAGNOSIS — I959 Hypotension, unspecified: Secondary | ICD-10-CM | POA: Diagnosis present

## 2020-11-16 DIAGNOSIS — Z886 Allergy status to analgesic agent status: Secondary | ICD-10-CM

## 2020-11-16 DIAGNOSIS — Z8262 Family history of osteoporosis: Secondary | ICD-10-CM

## 2020-11-16 DIAGNOSIS — Z91018 Allergy to other foods: Secondary | ICD-10-CM | POA: Diagnosis not present

## 2020-11-16 DIAGNOSIS — R531 Weakness: Secondary | ICD-10-CM | POA: Diagnosis not present

## 2020-11-16 DIAGNOSIS — Z7982 Long term (current) use of aspirin: Secondary | ICD-10-CM | POA: Diagnosis not present

## 2020-11-16 DIAGNOSIS — Z85820 Personal history of malignant melanoma of skin: Secondary | ICD-10-CM

## 2020-11-16 DIAGNOSIS — I708 Atherosclerosis of other arteries: Secondary | ICD-10-CM | POA: Diagnosis present

## 2020-11-16 DIAGNOSIS — M810 Age-related osteoporosis without current pathological fracture: Secondary | ICD-10-CM | POA: Diagnosis present

## 2020-11-16 DIAGNOSIS — M1990 Unspecified osteoarthritis, unspecified site: Secondary | ICD-10-CM | POA: Diagnosis present

## 2020-11-16 DIAGNOSIS — I10 Essential (primary) hypertension: Secondary | ICD-10-CM | POA: Diagnosis present

## 2020-11-16 DIAGNOSIS — I63239 Cerebral infarction due to unspecified occlusion or stenosis of unspecified carotid arteries: Secondary | ICD-10-CM | POA: Diagnosis present

## 2020-11-16 HISTORY — PX: CAROTID PTA/STENT INTERVENTION: CATH118231

## 2020-11-16 LAB — CREATININE, SERUM
Creatinine, Ser: 0.79 mg/dL (ref 0.44–1.00)
GFR, Estimated: >60 mL/min (ref >60)

## 2020-11-16 LAB — GLUCOSE, CAPILLARY: Glucose-Capillary: 114 mg/dL — ABNORMAL HIGH (ref 70–99)

## 2020-11-16 LAB — BUN: BUN: 11 mg/dL (ref 8–23)

## 2020-11-16 LAB — MRSA PCR SCREENING: MRSA by PCR: NEGATIVE

## 2020-11-16 SURGERY — CAROTID PTA/STENT INTERVENTION
Anesthesia: Moderate Sedation

## 2020-11-16 MED ORDER — HYDROCHLOROTHIAZIDE 12.5 MG PO CAPS
12.5000 mg | ORAL_CAPSULE | Freq: Every day | ORAL | Status: DC
  Filled 2020-11-16: qty 1

## 2020-11-16 MED ORDER — MIDAZOLAM HCL 2 MG/ML PO SYRP: 8.0000 mg | ORAL_SOLUTION | Freq: Once | ORAL | Status: DC | PRN

## 2020-11-16 MED ORDER — POTASSIUM CHLORIDE CRYS ER 20 MEQ PO TBCR: 20.0000 meq | EXTENDED_RELEASE_TABLET | Freq: Every day | ORAL | Status: DC | PRN

## 2020-11-16 MED ORDER — HEPARIN SODIUM (PORCINE) 1000 UNIT/ML IJ SOLN
INTRAMUSCULAR | Status: AC
  Filled 2020-11-16: qty 1

## 2020-11-16 MED ORDER — MORPHINE SULFATE (PF) 4 MG/ML IV SOLN: 2.0000 mg | INTRAVENOUS | Status: DC | PRN

## 2020-11-16 MED ORDER — FAMOTIDINE IN NACL 20-0.9 MG/50ML-% IV SOLN: 20.0000 mg | Freq: Two times a day (BID) | INTRAVENOUS | Status: DC

## 2020-11-16 MED ORDER — CEFAZOLIN SODIUM-DEXTROSE 2-4 GM/100ML-% IV SOLN
2.0000 g | Freq: Three times a day (TID) | INTRAVENOUS | Status: AC
  Administered 2020-11-16 – 2020-11-17 (×2): 2 g via INTRAVENOUS
  Filled 2020-11-16 (×3): qty 100

## 2020-11-16 MED ORDER — DOPAMINE-DEXTROSE 3.2-5 MG/ML-% IV SOLN
INTRAVENOUS | Status: AC
  Filled 2020-11-16: qty 250

## 2020-11-16 MED ORDER — BENAZEPRIL HCL 10 MG PO TABS
10.0000 mg | ORAL_TABLET | Freq: Every day | ORAL | Status: DC
  Filled 2020-11-16: qty 1

## 2020-11-16 MED ORDER — METOPROLOL TARTRATE 5 MG/5ML IV SOLN: 2.0000 mg | INTRAVENOUS | Status: DC | PRN

## 2020-11-16 MED ORDER — FENTANYL CITRATE (PF) 100 MCG/2ML IJ SOLN
INTRAMUSCULAR | Status: AC
  Filled 2020-11-16: qty 2

## 2020-11-16 MED ORDER — METHYLPREDNISOLONE SODIUM SUCC 125 MG IJ SOLR: 125.0000 mg | Freq: Once | INTRAMUSCULAR | Status: DC | PRN

## 2020-11-16 MED ORDER — BENAZEPRIL-HYDROCHLOROTHIAZIDE 10-12.5 MG PO TABS: 1.0000 | ORAL_TABLET | Freq: Every day | ORAL | Status: DC

## 2020-11-16 MED ORDER — CEFAZOLIN SODIUM-DEXTROSE 2-4 GM/100ML-% IV SOLN
INTRAVENOUS | Status: AC
  Administered 2020-11-16: 2 g via INTRAVENOUS
  Filled 2020-11-16: qty 100

## 2020-11-16 MED ORDER — ADULT MULTIVITAMIN W/MINERALS CH
1.0000 | ORAL_TABLET | Freq: Every day | ORAL | Status: DC
  Administered 2020-11-17 – 2020-11-19 (×3): 1 via ORAL
  Filled 2020-11-16 (×3): qty 1

## 2020-11-16 MED ORDER — ONDANSETRON HCL 4 MG/2ML IJ SOLN
INTRAMUSCULAR | Status: AC
  Filled 2020-11-16: qty 2

## 2020-11-16 MED ORDER — ACETAMINOPHEN 325 MG PO TABS
325.0000 mg | ORAL_TABLET | ORAL | Status: DC | PRN
Start: 2020-11-16 — End: 2020-11-19
  Administered 2020-11-16 – 2020-11-17 (×2): 650 mg via ORAL
  Filled 2020-11-16 (×2): qty 2

## 2020-11-16 MED ORDER — ALUM & MAG HYDROXIDE-SIMETH 200-200-20 MG/5ML PO SUSP: 15.0000 mL | ORAL | Status: DC | PRN

## 2020-11-16 MED ORDER — FAMOTIDINE 20 MG PO TABS: 40.0000 mg | ORAL_TABLET | Freq: Once | ORAL | Status: DC | PRN

## 2020-11-16 MED ORDER — HYDROMORPHONE HCL 1 MG/ML IJ SOLN: 1.0000 mg | Freq: Once | INTRAMUSCULAR | Status: DC | PRN

## 2020-11-16 MED ORDER — ASPIRIN EC 81 MG PO TBEC
81.0000 mg | DELAYED_RELEASE_TABLET | Freq: Every day | ORAL | Status: DC
  Administered 2020-11-17 – 2020-11-19 (×3): 81 mg via ORAL
  Filled 2020-11-16 (×3): qty 1

## 2020-11-16 MED ORDER — OXYCODONE-ACETAMINOPHEN 5-325 MG PO TABS
1.0000 | ORAL_TABLET | Freq: Four times a day (QID) | ORAL | Status: DC | PRN
Start: 2020-11-16 — End: 2020-11-19

## 2020-11-16 MED ORDER — ONDANSETRON HCL 4 MG/2ML IJ SOLN: 4.0000 mg | Freq: Four times a day (QID) | INTRAMUSCULAR | Status: DC | PRN

## 2020-11-16 MED ORDER — GUAIFENESIN-DM 100-10 MG/5ML PO SYRP: 15.0000 mL | ORAL_SOLUTION | ORAL | Status: DC | PRN

## 2020-11-16 MED ORDER — SODIUM CHLORIDE 0.9 % IV SOLN: INTRAVENOUS | Status: AC

## 2020-11-16 MED ORDER — CEFAZOLIN SODIUM-DEXTROSE 2-4 GM/100ML-% IV SOLN: 2.0000 g | Freq: Once | INTRAVENOUS | Status: AC

## 2020-11-16 MED ORDER — MIDAZOLAM HCL 2 MG/2ML IJ SOLN
INTRAMUSCULAR | Status: DC | PRN
  Administered 2020-11-16: 1 mg via INTRAVENOUS

## 2020-11-16 MED ORDER — CLOPIDOGREL BISULFATE 75 MG PO TABS
75.0000 mg | ORAL_TABLET | Freq: Every day | ORAL | Status: DC
  Administered 2020-11-17 – 2020-11-19 (×3): 75 mg via ORAL
  Filled 2020-11-16 (×3): qty 1

## 2020-11-16 MED ORDER — HYDRALAZINE HCL 20 MG/ML IJ SOLN: 5.0000 mg | INTRAMUSCULAR | Status: DC | PRN

## 2020-11-16 MED ORDER — MIDAZOLAM HCL 5 MG/5ML IJ SOLN
INTRAMUSCULAR | Status: AC
  Filled 2020-11-16: qty 5

## 2020-11-16 MED ORDER — BENAZEPRIL HCL 10 MG PO TABS
10.0000 mg | ORAL_TABLET | Freq: Every day | ORAL | Status: DC
  Filled 2020-11-16 (×3): qty 1

## 2020-11-16 MED ORDER — PHENOL 1.4 % MT LIQD
1.0000 | OROMUCOSAL | Status: DC | PRN
  Filled 2020-11-16: qty 177

## 2020-11-16 MED ORDER — IODIXANOL 320 MG/ML IV SOLN
INTRAVENOUS | Status: DC | PRN
Start: 2020-11-16 — End: 2020-11-16
  Administered 2020-11-16: 50 mL

## 2020-11-16 MED ORDER — PANTOPRAZOLE SODIUM 40 MG PO TBEC
40.0000 mg | DELAYED_RELEASE_TABLET | Freq: Every day | ORAL | Status: DC
  Administered 2020-11-17 – 2020-11-19 (×3): 40 mg via ORAL
  Filled 2020-11-16 (×3): qty 1

## 2020-11-16 MED ORDER — LABETALOL HCL 5 MG/ML IV SOLN: 10.0000 mg | INTRAVENOUS | Status: DC | PRN

## 2020-11-16 MED ORDER — DIPHENHYDRAMINE HCL 50 MG/ML IJ SOLN: 50.0000 mg | Freq: Once | INTRAMUSCULAR | Status: DC | PRN

## 2020-11-16 MED ORDER — ATROPINE SULFATE 1 MG/10ML IJ SOSY
PREFILLED_SYRINGE | INTRAMUSCULAR | Status: DC | PRN
Start: 2020-11-16 — End: 2020-11-16
  Administered 2020-11-16: 1 mg via INTRAVENOUS

## 2020-11-16 MED ORDER — CHLORHEXIDINE GLUCONATE CLOTH 2 % EX PADS
6.0000 | MEDICATED_PAD | Freq: Every day | CUTANEOUS | Status: DC
  Administered 2020-11-16 – 2020-11-19 (×2): 6 via TOPICAL

## 2020-11-16 MED ORDER — DOPAMINE-DEXTROSE 3.2-5 MG/ML-% IV SOLN
2.0000 ug/kg/min | INTRAVENOUS | Status: DC
  Administered 2020-11-17: 6 ug/kg/min via INTRAVENOUS
  Filled 2020-11-16: qty 250

## 2020-11-16 MED ORDER — ONDANSETRON HCL 4 MG/2ML IJ SOLN
4.0000 mg | Freq: Four times a day (QID) | INTRAMUSCULAR | Status: DC | PRN
  Administered 2020-11-16 – 2020-11-17 (×2): 4 mg via INTRAVENOUS
  Filled 2020-11-16 (×2): qty 2

## 2020-11-16 MED ORDER — FENTANYL CITRATE (PF) 100 MCG/2ML IJ SOLN
INTRAMUSCULAR | Status: DC | PRN
  Administered 2020-11-16: 50 ug via INTRAVENOUS

## 2020-11-16 MED ORDER — PHENYLEPHRINE HCL (PRESSORS) 10 MG/ML IV SOLN
INTRAVENOUS | Status: AC
  Filled 2020-11-16: qty 1

## 2020-11-16 MED ORDER — ACETAMINOPHEN 325 MG RE SUPP
325.0000 mg | RECTAL | Status: DC | PRN
  Filled 2020-11-16: qty 2

## 2020-11-16 MED ORDER — SODIUM CHLORIDE 0.9 % IV SOLN: 500.0000 mL | Freq: Once | INTRAVENOUS | Status: DC | PRN

## 2020-11-16 MED ORDER — PHENYLEPHRINE 40 MCG/ML (10ML) SYRINGE FOR IV PUSH (FOR BLOOD PRESSURE SUPPORT)
PREFILLED_SYRINGE | INTRAVENOUS | Status: DC | PRN
  Administered 2020-11-16: 2 ug via INTRAVENOUS

## 2020-11-16 MED ORDER — HEPARIN SODIUM (PORCINE) 1000 UNIT/ML IJ SOLN
INTRAMUSCULAR | Status: DC | PRN
  Administered 2020-11-16: 7000 [IU] via INTRAVENOUS

## 2020-11-16 MED ORDER — ATROPINE SULFATE 1 MG/10ML IJ SOSY
PREFILLED_SYRINGE | INTRAMUSCULAR | Status: AC
  Filled 2020-11-16: qty 10

## 2020-11-16 MED ORDER — MAGNESIUM SULFATE 2 GM/50ML IV SOLN
2.0000 g | Freq: Every day | INTRAVENOUS | Status: DC | PRN
  Filled 2020-11-16: qty 50

## 2020-11-16 MED ORDER — ASPIRIN EC 81 MG PO TBEC: 81.0000 mg | DELAYED_RELEASE_TABLET | Freq: Every day | ORAL | Status: DC

## 2020-11-16 MED ORDER — HYDROCHLOROTHIAZIDE 12.5 MG PO CAPS
12.5000 mg | ORAL_CAPSULE | Freq: Every day | ORAL | Status: DC
  Filled 2020-11-16 (×3): qty 1

## 2020-11-16 MED ORDER — ATORVASTATIN CALCIUM 20 MG PO TABS
40.0000 mg | ORAL_TABLET | Freq: Every day | ORAL | Status: DC
  Administered 2020-11-16 – 2020-11-18 (×3): 40 mg via ORAL
  Filled 2020-11-16 (×3): qty 2

## 2020-11-16 MED ORDER — SODIUM CHLORIDE 0.9 % IV SOLN: INTRAVENOUS | Status: DC

## 2020-11-16 MED ORDER — OXYCODONE-ACETAMINOPHEN 5-325 MG PO TABS: 1.0000 | ORAL_TABLET | ORAL | Status: DC | PRN

## 2020-11-16 SURGICAL SUPPLY — 19 items
BALLN VIATRAC 5X20X135 (BALLOONS) ×2
BALLOON VIATRAC 5X20X135 (BALLOONS) IMPLANT
CATH ANGIO 5F 100CM .035 PIG (CATHETERS) ×1 IMPLANT
CATH BEACON 5 .035 100 H1 TIP (CATHETERS) ×1 IMPLANT
COVER PROBE U/S 5X48 (MISCELLANEOUS) ×1 IMPLANT
DEVICE EMBOSHIELD NAV6 4.0-7.0 (FILTER) ×1 IMPLANT
DEVICE SAFEGUARD 24CM (GAUZE/BANDAGES/DRESSINGS) ×1 IMPLANT
DEVICE STARCLOSE SE CLOSURE (Vascular Products) ×1 IMPLANT
DEVICE TORQUE (MISCELLANEOUS) ×1 IMPLANT
GLIDEWIRE ANGLED SS 035X260CM (WIRE) ×1 IMPLANT
KIT CAROTID MANIFOLD (MISCELLANEOUS) ×1 IMPLANT
KIT ENCORE 26 ADVANTAGE (KITS) ×1 IMPLANT
PACK ANGIOGRAPHY (CUSTOM PROCEDURE TRAY) ×2 IMPLANT
SHEATH BRITE TIP 5FRX11 (SHEATH) ×1 IMPLANT
SHEATH SHUTTLE SELECT 6F (SHEATH) ×1 IMPLANT
STENT XACT CAR 10-8X30X136 (Permanent Stent) ×1 IMPLANT
SYR MEDRAD MARK 7 150ML (SYRINGE) ×1 IMPLANT
WIRE G VAS 035X260 STIFF (WIRE) ×1 IMPLANT
WIRE GUIDERIGHT .035X150 (WIRE) ×1 IMPLANT

## 2020-11-16 NOTE — Interval H&P Note (Signed)
History and Physical Interval Note:  11/16/2020 8:13 AM  [name]  has presented today for surgery, with the diagnosis of LT Carotid Stent Placement   LT Carotid Artery Stenosis   ABBOTT Rep cc: S Willey   Covid test on 5-19.  The various methods of treatment have been discussed with the patient and family. After consideration of risks, benefits and other options for treatment, the patient has consented to  Procedure(s): CAROTID PTA/STENT INTERVENTION (N/A) as a surgical intervention.  The patient's history has been reviewed, patient examined, no change in status, stable for surgery.  I have reviewed the patient's chart and labs.  Questions were answered to the patient's satisfaction.     [name]

## 2020-11-16 NOTE — Op Note (Signed)
OPERATIVE NOTE DATE: 11/16/2020  PROCEDURE: 1.  Ultrasound guidance for vascular access right femoral artery 2.  Placement of a 10 mm proximal, 8 mm distal, 3 cm long Exact stent with the use of the NAV-6 embolic protection device in the left carotid artery  PRE-OPERATIVE DIAGNOSIS: 1. 75% left carotid artery stenosis. 2. Left hemispheric stroke about three weeks ago  POST-OPERATIVE DIAGNOSIS:  Same as above  SURGEON: [name], MD  ASSISTANT(S):  none  ANESTHESIA: local/MCS  ESTIMATED BLOOD LOSS:  50 cc  CONTRAST: 50 cc  FLUORO TIME: 6.7 minutes  MODERATE CONSCIOUS SEDATION TIME:  Approximately 36 minutes using 1 mg of Versed and 50 mcg of Fentanyl  FINDING(S): 1.   Approximately 70 to 75% carotid artery stenosis  SPECIMEN(S):   none  INDICATIONS:   Patient is a 80 y.o. female who presents with a recent left hemispheric stroke and significant left carotid artery stenosis.  The patient has a relatively high bifurcation and carotid artery stenting was felt to be preferred to endarterectomy for that reason.  Risks and benefits were discussed and informed consent was obtained.   DESCRIPTION: After obtaining full informed written consent, the patient was brought back to the vascular suite and placed supine upon the table.  The patient received IV antibiotics prior to induction. Moderate conscious sedation was administered during a face to face encounter with the patient throughout the procedure with my supervision of the RN administering medicines and monitoring the patients vital signs and mental status throughout from the start of the procedure until the patient was taken to the recovery room.  After obtaining adequate anesthesia, the patient was prepped and draped in the standard fashion.   The right femoral artery was visualized with ultrasound and found to be widely patent. It was then accessed under direct ultrasound guidance without difficulty with a Seldinger needle. A  permanent image was recorded. A J-wire was placed and we then placed a 6 French sheath. The patient was then heparinized and a total of 7000 units of intravenous heparin were given and an ACT was checked to confirm successful anticoagulation. A pigtail catheter was then placed into the ascending aorta. This showed a type II aortic arch with no significant stenosis in the innominate artery or left common carotid artery proximally, but at least a 70 to 80% stenosis in the left subclavian artery was identified with very poor vertebral artery filling. I then selectively cannulated the left common carotid artery without difficulty with a headhunter catheter and advanced into the mid left common carotid artery.  Cervical and cerebral carotid angiography was then performed. There was essentially no anterior cerebral artery flow with a relatively normal middle cerebral artery filling. The carotid bifurcation demonstrated a moderately calcific lesion just beyond the origin of the left internal carotid artery in the 70 to 75% range seen best in the lateral projection.  I then advanced into the external carotid artery with a Glidewire and the headhunter catheter and then exchanged for the Amplatz Super Stiff wire. Over the Amplatz Super Stiff wire, a 6 [place] shuttle sheath was placed into the mid common carotid artery. I then used the NAV-6  Embolic protection device and crossed the lesion and parked this in the distal internal carotid artery at the base of the skull.  I then selected a 10 mm proximal, 8 mm distal, 3 cm long exact stent. This was deployed across the lesion encompassing it in its entirety. A 5 mm diameter by 2  cm length balloon was used to post dilate the stent. Only about a 15-20% residual stenosis was present after angioplasty. Completion angiogram showed continued sluggish anterior cerebral artery with good middle cerebral artery filling without new defects. At this point I elected to terminate the  procedure. The sheath was removed and StarClose closure device was deployed in the right femoral artery with excellent hemostatic result. The patient was taken to the recovery room in stable condition having tolerated the procedure well.  COMPLICATIONS: none  CONDITION: stable  [name] 11/16/2020 9:27 AM   This note was created with Dragon Medical transcription system. Any errors in dictation are purely unintentional.

## 2020-11-17 ENCOUNTER — Encounter: Payer: Self-pay | Admitting: Vascular Surgery

## 2020-11-17 LAB — BASIC METABOLIC PANEL
Anion gap: 7 (ref 5–15)
BUN: 7 mg/dL — ABNORMAL LOW (ref 8–23)
CO2: 25 mmol/L (ref 22–32)
Calcium: 8.4 mg/dL — ABNORMAL LOW (ref 8.9–10.3)
Chloride: 105 mmol/L (ref 98–111)
Creatinine, Ser: 0.67 mg/dL (ref 0.44–1.00)
GFR, Estimated: >60 mL/min (ref >60)
Glucose, Bld: 139 mg/dL — ABNORMAL HIGH (ref 70–99)
Potassium: 3.8 mmol/L (ref 3.5–5.1)
Sodium: 137 mmol/L (ref 135–145)

## 2020-11-17 LAB — CBC
HCT: 35.7 % — ABNORMAL LOW (ref 36.0–46.0)
Hemoglobin: 12.3 g/dL (ref 12.0–15.0)
MCH: 30.1 pg (ref 26.0–34.0)
MCHC: 34.5 g/dL (ref 30.0–36.0)
MCV: 87.3 fL (ref 80.0–100.0)
Platelets: 249 10*3/uL (ref 150–400)
RBC: 4.09 MIL/uL (ref 3.87–5.11)
RDW: 11.9 % (ref 11.5–15.5)
WBC: 11.8 10*3/uL — ABNORMAL HIGH (ref 4.0–10.5)
nRBC: 0 % (ref 0.0–0.2)

## 2020-11-17 MED ORDER — PROMETHAZINE HCL 25 MG PO TABS
25.0000 mg | ORAL_TABLET | Freq: Four times a day (QID) | ORAL | Status: DC | PRN
  Administered 2020-11-17: 25 mg via ORAL
  Filled 2020-11-17 (×2): qty 1

## 2020-11-17 NOTE — Progress Notes (Signed)
[facility]  Daily Progress Note   Subjective  -   Doing ok, but still requiring dopamine for BP and HR.  Weaned some, but had to go back up after getting out of bed.  No other complaints. Neuro exam stable with no new deficits.  Objective Vitals:   11/17/20 0900 11/17/20 1000 11/17/20 1100 11/17/20 1200  BP: (!) 118/41 (!) 112/39 (!) 112/42 (!) 107/51  Pulse: (!) 56 (!) 55 62 (!) 51  Resp: 18 19 16 16   Temp:    98.2 F (36.8 C)  TempSrc:    Oral  SpO2: 95% 97% 99% 97%  Weight:      Height:        Intake/Output Summary (Last 24 hours) at 11/17/2020 1514 Last data filed at 11/17/2020 1403 Gross per 24 hour  Intake 1698.41 ml  Output 3600 ml  Net -1901.59 ml    PULM  CTAB CV  RRR VASC  Access site C/D/I  Laboratory CBC    Component Value Date/Time   WBC 11.8 (H) 11/17/2020 0432   HGB 12.3 11/17/2020 0432   HCT 35.7 (L) 11/17/2020 0432   PLT 249 11/17/2020 0432    BMET    Component Value Date/Time   NA 137 11/17/2020 0432   K 3.8 11/17/2020 0432   CL 105 11/17/2020 0432   CO2 25 11/17/2020 0432   GLUCOSE 139 (H) 11/17/2020 0432   BUN 7 (L) 11/17/2020 0432   CREATININE 0.67 11/17/2020 0432   CALCIUM 8.4 (L) 11/17/2020 0432   GFRNONAA >60 11/17/2020 0432   GFRAA >60 07/29/2017 0437    Assessment/Planning: POD #1 s/p left carotid stent   Bradycardia and low BP requiring dopamine. Wean as tolerated.   Holding anti-hypertensives      [name]  11/17/2020, 3:14 PM

## 2020-11-18 LAB — POCT ACTIVATED CLOTTING TIME: Activated Clotting Time: 362 seconds

## 2020-11-18 NOTE — Progress Notes (Signed)
[facility]  Daily Progress Note   Subjective  -   Patient without complaints.  Resting quietly in the bed.  Has been unable to wean off of dopamine although is on 6 mcgs at the time of my exam.  Blood pressure has still been in the low normal range with heart rates in the 50s.  Objective Vitals:   11/18/20 0800 11/18/20 0900 11/18/20 1000 11/18/20 1100  BP: (!) 140/42 (!) 108/40 (!) 111/49 (!) 103/49  Pulse: (!) 57 65 (!) 56 (!) 58  Resp: 14 16 17 13   Temp:      TempSrc:      SpO2: 92% 94% 94% 95%  Weight:      Height:        Intake/Output Summary (Last 24 hours) at 11/18/2020 1252 Last data filed at 11/18/2020 9179 Gross per 24 hour  Intake 213.72 ml  Output 800 ml  Net -586.28 ml    PULM  CTAB CV  RRR VASC  access site is clean, dry, and intact  Laboratory CBC    Component Value Date/Time   WBC 11.8 (H) 11/17/2020 0432   HGB 12.3 11/17/2020 0432   HCT 35.7 (L) 11/17/2020 0432   PLT 249 11/17/2020 0432    BMET    Component Value Date/Time   NA 137 11/17/2020 0432   K 3.8 11/17/2020 0432   CL 105 11/17/2020 0432   CO2 25 11/17/2020 0432   GLUCOSE 139 (H) 11/17/2020 0432   BUN 7 (L) 11/17/2020 0432   CREATININE 0.67 11/17/2020 0432   CALCIUM 8.4 (L) 11/17/2020 0432   GFRNONAA >60 11/17/2020 0432   GFRAA >60 07/29/2017 0437    Assessment/Planning: POD #2 s/p left carotid stent placement   Still requiring some dopamine for bradycardia and hypotension.  Otherwise doing well.  Tolerate slightly lower blood pressures and heart rates as long as she is asymptomatic.  Wean dopamine as tolerated.  Hopefully we can get her off dopamine today and get her home tomorrow.    [name]  11/18/2020, 12:52 PM

## 2020-11-19 MED ORDER — ATORVASTATIN CALCIUM 20 MG PO TABS: 40.0000 mg | ORAL_TABLET | Freq: Every day | ORAL | 5 refills | Status: DC

## 2020-11-19 NOTE — Discharge Summary (Signed)
[facility]    Discharge Summary    Patient ID:  Angela Bowen MRN: [id number] DOB/AGE: [birth year] 80 y.o.  Admit date: 11/16/2020 Discharge date: 11/19/2020 Date of Surgery: 11/16/2020 Surgeon: Surgeon(s): Algernon Huxley, MD  Admission Diagnosis: Carotid stenosis, symptomatic, with infarction [facility]) [I63.239]  Discharge Diagnoses:  Carotid stenosis, symptomatic, with infarction [facility]) [I63.239]  Secondary Diagnoses: Past Medical History:  Diagnosis Date  . Cancer ([place])    stage II melanoma; right leg;face  . Diverticulosis of colon   . Gallstones   . GERD (gastroesophageal reflux disease)   . History of skin cancer   . Hyperlipidemia   . Hypertension   . Normal stress echocardiogram 01/2006   echo 5/98, fairly normal  . Osteoarthritis   . Osteoporosis, unspecified   . S/P sclerotherapy of varicose veins 07/30/06   left leg, Dr. Eilleen Kempf  . Skin cancer    STAGE II ON RIGHT LEG    Procedure(s): CAROTID PTA/STENT INTERVENTION  Discharged Condition: good  HPI:  Patient with symptomatic left carotid stenosis  Hospital Course:  Angela Bowen is a 80 y.o. female is S/P Left Procedure(s): CAROTID PTA/STENT INTERVENTION Extubated: NA Physical exam: access site C/D/I. Neuro exam intact Post-op wounds clean, dry, intact or healing well Pt. Ambulating, voiding and taking PO diet without difficulty. Pt pain controlled with PO pain meds. Labs as below Complications:none  Consults:    Significant Diagnostic Studies: CBC Lab Results  Component Value Date   WBC 11.8 (H) 11/17/2020   HGB 12.3 11/17/2020   HCT 35.7 (L) 11/17/2020   MCV 87.3 11/17/2020   PLT 249 11/17/2020    BMET    Component Value Date/Time   NA 137 11/17/2020 0432   K 3.8 11/17/2020 0432   CL 105 11/17/2020 0432   CO2 25 11/17/2020 0432   GLUCOSE 139 (H) 11/17/2020 0432   BUN 7 (L) 11/17/2020 0432   CREATININE 0.67 11/17/2020 0432   CALCIUM 8.4 (L) 11/17/2020  0432   GFRNONAA >60 11/17/2020 0432   GFRAA >60 07/29/2017 0437   COAG Lab Results  Component Value Date   INR 1.0 10/26/2020     Disposition:  Discharge to :Home Discharge Instructions    Call MD for:  redness, tenderness, or signs of infection (pain, swelling, bleeding, redness, odor or green/yellow discharge around incision site)   Complete by: As directed    Call MD for:  severe or increased pain, loss or decreased feeling  in affected limb(s)   Complete by: As directed    Call MD for:  temperature >100.5   Complete by: As directed    Driving Restrictions   Complete by: As directed    No driving for 24 hours   No dressing needed   Complete by: As directed    Replace only if drainage present   Resume previous diet   Complete by: As directed      Allergies as of 11/19/2020      Reactions   Meat [alpha-gal]    RED MEAT-CAUSES SEVERE HIVES,SOB   Aspirin    High doses - intolerant/causes hives only at high doses, tolerates 81 mg daily well   Codeine    REACTION: rash      Medication List    TAKE these medications   aspirin 81 MG EC tablet Take 1 tablet (81 mg total) by mouth daily. Swallow whole.   atorvastatin 20 MG tablet Commonly known as: LIPITOR Take 2 tablets (40 mg  total) by mouth at bedtime.   benazepril-hydrochlorthiazide 10-12.5 MG tablet Commonly known as: LOTENSIN HCT TAKE 1 TABLET DAILY   clopidogrel 75 MG tablet Commonly known as: PLAVIX Take 1 tablet (75 mg total) by mouth daily.   EPINEPHrine 0.3 mg/0.3 mL Soaj injection Commonly known as: EPI-PEN Inject 0.3 mLs (0.3 mg total) into the skin as needed.   multivitamin tablet Take 1 tablet by mouth daily.   pantoprazole 40 MG tablet Commonly known as: PROTONIX Take 1 tablet (40 mg total) by mouth daily before breakfast.      Verbal and written Discharge instructions given to the patient. Wound care per Discharge AVS  Follow-up Information    Kris Hartmann, NP Follow up in 3  week(s).   Specialty: Vascular Surgery Why: with carotid duplex Contact information: [address] [address] [phone]               Signed: [name], MD  11/19/2020, 1:34 PM

## 2020-11-20 ENCOUNTER — Telehealth: Payer: Self-pay | Admitting: *Deleted

## 2020-11-20 MED ORDER — CLOPIDOGREL BISULFATE 75 MG PO TABS: 75.0000 mg | ORAL_TABLET | Freq: Every day | ORAL | 0 refills | Status: DC

## 2020-11-20 MED ORDER — CLOPIDOGREL BISULFATE 75 MG PO TABS: 75.0000 mg | ORAL_TABLET | Freq: Every day | ORAL | 3 refills | Status: DC

## 2020-11-20 NOTE — Telephone Encounter (Signed)
Pt left message at Triage. She just was d/c from hospital and needs her plavix refilled. Pt is going to run out soon and needs a small supply sent to [facility] and a 3 month supply sent to [facility]

## 2020-11-20 NOTE — Telephone Encounter (Signed)
Spoke to pt. Advised her the medications have been sent in.

## 2020-11-20 NOTE — Telephone Encounter (Signed)
Will clear with Dr Silvio Pate that he is filling the mediatation.

## 2020-11-20 NOTE — Telephone Encounter (Signed)
Yes --that is fine 1 month local---1 year to mail away

## 2020-11-25 DIAGNOSIS — I6389 Other cerebral infarction: Secondary | ICD-10-CM | POA: Diagnosis not present

## 2020-11-25 DIAGNOSIS — I639 Cerebral infarction, unspecified: Secondary | ICD-10-CM | POA: Diagnosis not present

## 2020-12-03 ENCOUNTER — Telehealth: Payer: Self-pay

## 2020-12-03 NOTE — Telephone Encounter (Signed)
Was able to reach out to Mrs. Rama via phone and make contact to review their recent ZIO monitor results. Cadence Kathlen Mody, PA-C advised based on the current results   Heart monitor showed predominately normal sinus rhythm. Occasional SVT, briefepisode, isolated rare PVCs, no afib/flutter.   Pt verbalized understanding, is thankful for the results call, all questions and concerns were address. Will call back for anything further, f/u as schedule.

## 2020-12-09 DIAGNOSIS — Z8673 Personal history of transient ischemic attack (TIA), and cerebral infarction without residual deficits: Secondary | ICD-10-CM | POA: Diagnosis not present

## 2020-12-30 ENCOUNTER — Encounter: Payer: Medicare Other | Admitting: Internal Medicine

## 2020-12-31 ENCOUNTER — Encounter: Payer: Self-pay | Admitting: Internal Medicine

## 2020-12-31 ENCOUNTER — Other Ambulatory Visit: Payer: Self-pay

## 2020-12-31 ENCOUNTER — Ambulatory Visit: Payer: Medicare Other | Admitting: Cardiology

## 2020-12-31 ENCOUNTER — Ambulatory Visit (INDEPENDENT_AMBULATORY_CARE_PROVIDER_SITE_OTHER): Payer: Medicare Other | Admitting: Internal Medicine

## 2020-12-31 VITALS — BP 122/80 | HR 65 | Temp 97.9°F | Ht 60.0 in | Wt 104.0 lb

## 2020-12-31 DIAGNOSIS — E042 Nontoxic multinodular goiter: Secondary | ICD-10-CM | POA: Diagnosis not present

## 2020-12-31 DIAGNOSIS — Z0000 Encounter for general adult medical examination without abnormal findings: Secondary | ICD-10-CM

## 2020-12-31 DIAGNOSIS — M159 Polyosteoarthritis, unspecified: Secondary | ICD-10-CM

## 2020-12-31 DIAGNOSIS — I10 Essential (primary) hypertension: Secondary | ICD-10-CM

## 2020-12-31 DIAGNOSIS — E441 Mild protein-calorie malnutrition: Secondary | ICD-10-CM | POA: Diagnosis not present

## 2020-12-31 DIAGNOSIS — I6522 Occlusion and stenosis of left carotid artery: Secondary | ICD-10-CM | POA: Diagnosis not present

## 2020-12-31 DIAGNOSIS — Z7189 Other specified counseling: Secondary | ICD-10-CM

## 2020-12-31 DIAGNOSIS — Z8673 Personal history of transient ischemic attack (TIA), and cerebral infarction without residual deficits: Secondary | ICD-10-CM

## 2020-12-31 NOTE — Assessment & Plan Note (Signed)
BP Readings from Last 3 Encounters:  12/31/20 122/80  11/19/20 (!) 120/57  11/05/20 118/60   Good control on the benazepril/HCTZ

## 2020-12-31 NOTE — Progress Notes (Signed)
Subjective:    Patient ID: Angela Bowen, female    DOB: June 07, 1941, 80 y.o.   MRN: [id number]  HPI Here with husband for Medicare wellness visit and follow up of chronic health conditions This visit occurred during the SARS-CoV-2 public health emergency.  Safety protocols were in place, including screening questions prior to the visit, additional usage of staff PPE, and extensive cleaning of exam room while observing appropriate contact time as indicated for disinfecting solutions.   Reviewed advanced directives Reviewed other doctors----Dr Dew--vascular surgeon, Dr Agbor-Etang--cardiology (wants a different one), Dr Potter--neurologist, [facility]) Did have hospitalization for a stroke and then carotid surgery after Vision and hearing are okay She and husband have restarted walking (30 minutes) No falls Some mild mood issues with medical issues---no anhedonia Stress with recent down sizing move Independent with instrumental ADLs No sig memory problems  Concerned about weight loss--- 15# recently Not eating as much per husband--had trouble at first after stroke Seems to have bottomed out Sporadic ensure  Ongoing problems with back Pain between shoulder blades with activity---like repetitive movements (like standing doing the cooking or sweeping) Hasn't tried any meds--discussed tylenol  Pantoprazole "is not working" Did better with omeprazole No dysphagia Stopping plavix in August  Needs thyroid checked---incidental nodule seen  No chest pain No SOB No dizziness or syncope No sig edema  Current Outpatient Medications on File Prior to Visit  Medication Sig Dispense Refill   aspirin EC 81 MG EC tablet Take 1 tablet (81 mg total) by mouth daily. Swallow whole. 30 tablet 0   atorvastatin (LIPITOR) 20 MG tablet Take 2 tablets (40 mg total) by mouth at bedtime. 30 tablet 5   benazepril-hydrochlorthiazide (LOTENSIN HCT) 10-12.5 MG tablet TAKE 1  TABLET DAILY 90 tablet 3   clopidogrel (PLAVIX) 75 MG tablet Take 1 tablet (75 mg total) by mouth daily. 90 tablet 3   EPINEPHrine 0.3 mg/0.3 mL IJ SOAJ injection Inject 0.3 mLs (0.3 mg total) into the skin as needed. 2 each 1   Multiple Vitamin (MULTIVITAMIN) tablet Take 1 tablet by mouth daily.     pantoprazole (PROTONIX) 40 MG tablet Take 1 tablet (40 mg total) by mouth daily before breakfast. 30 tablet 0   [DISCONTINUED] Calcium-Magnesium (CALCIUM MAGNESIUM 750) 300-300 MG TABS Take by mouth daily.      No current facility-administered medications on file prior to visit.    Allergies  Allergen Reactions   Aspirin     High doses - intolerant/causes hives only at high doses, tolerates 81 mg daily well   Codeine     REACTION: rash    Past Medical History:  Diagnosis Date   Cancer ([place])    stage II melanoma; right leg;face   Diverticulosis of colon    Gallstones    GERD (gastroesophageal reflux disease)    History of skin cancer    Hyperlipidemia    Hypertension    Normal stress echocardiogram 01/2006   echo 5/98, fairly normal   Osteoarthritis    Osteoporosis, unspecified    S/P sclerotherapy of varicose veins 07/30/06   left leg, Dr. Eilleen Kempf   Skin cancer    STAGE II ON RIGHT LEG    Past Surgical History:  Procedure Laterality Date   CAROTID PTA/STENT INTERVENTION N/A 11/16/2020   Procedure: CAROTID PTA/STENT INTERVENTION;  Surgeon: Algernon Huxley, MD;  Location: [facility];  Service: Cardiovascular;  Laterality: N/A;   CHOLECYSTECTOMY  2001   FOOT SURGERY  right   MELANOMA EXCISION  7/10   leg   MOHS SURGERY     TEE WITHOUT CARDIOVERSION N/A 10/28/2020   Procedure: TRANSESOPHAGEAL ECHOCARDIOGRAM (TEE);  Surgeon: Corey Skains, MD;  Location: [facility];  Service: Cardiovascular;  Laterality: N/A;   [place]   bilateral   VARICOSE VEIN SURGERY  2004   right foot    Family History  Problem Relation Age of Onset   Alcohol abuse Mother     Colon cancer Mother    Osteoporosis Mother    Alcohol abuse Father    Diabetes Father    Lung cancer Brother    Ovarian cancer Maternal Grandmother    Breast cancer Neg Hx     Social History   Socioeconomic History   Marital status: Married    Spouse name: Not on file   Number of children: 2   Years of education: Not on file   Highest education level: Not on file  Occupational History   Occupation: homemaker  Tobacco Use   Smoking status: Never   Smokeless tobacco: Never  Vaping Use   Vaping Use: Never used  Substance and Sexual Activity   Alcohol use: Yes    Alcohol/week: 0.0 standard drinks    Comment: Wine, rarely   Drug use: Never   Sexual activity: Never  Other Topics Concern   Not on file  Social History Narrative   Has living will   No designated health care POA--requests husband. Alternate would be son [name]   Would accept resuscitation attempts   No tube feeds if cognitively unaware   Social Determinants of Health   Financial Resource Strain: Not on file  Food Insecurity: Not on file  Transportation Needs: Not on file  Physical Activity: Not on file  Stress: Not on file  Social Connections: Not on file  Intimate Partner Violence: Not on file   Review of Systems Some sleep problems No residual neurologic symptoms Has infected tooth--plans to see someone (needs new one) No recent derm visits---no suspicious lesions now  Bowels move fine--no blood Voids okay--no incontinence Some right foot pain---no obvious etiology    Objective:   Physical Exam Constitutional:      Appearance: Normal appearance.  HENT:     Mouth/Throat:     Comments: No lesions Eyes:     Conjunctiva/sclera: Conjunctivae normal.     Pupils: Pupils are equal, round, and reactive to light.  Cardiovascular:     Rate and Rhythm: Normal rate and regular rhythm.     Pulses: Normal pulses.     Heart sounds: No murmur heard.   No gallop.  Pulmonary:     Effort:  Pulmonary effort is normal.     Breath sounds: Normal breath sounds. No wheezing or rales.  Abdominal:     Palpations: Abdomen is soft.     Tenderness: There is no abdominal tenderness.  Musculoskeletal:     Cervical back: Neck supple.  Lymphadenopathy:     Cervical: No cervical adenopathy.  Skin:    General: Skin is warm.     Findings: No rash.  Neurological:     General: No focal deficit present.     Mental Status: She is alert and oriented to person, place, and time.     Comments: President--- "Zoila Shutter, Obama" [phone]-? D-l-r-[id number]-w Recall 3/3           Assessment & Plan:

## 2020-12-31 NOTE — Assessment & Plan Note (Addendum)
Will check ultrasound

## 2020-12-31 NOTE — Assessment & Plan Note (Signed)
I have personally reviewed the Medicare Annual Wellness questionnaire and have noted 1. The patient's medical and social history 2. Their use of alcohol, tobacco or illicit drugs 3. Their current medications and supplements 4. The patient's functional ability including ADL's, fall risks, home safety risks and hearing or visual             impairment. 5. Diet and physical activities 6. Evidence for depression or mood disorders  The patients weight, height, BMI and visual acuity have been recorded in the chart I have made referrals, counseling and provided education to the patient based review of the above and I have provided the pt with a written personalized care plan for preventive services.  I have provided you with a copy of your personalized plan for preventive services. Please take the time to review along with your updated medication list.  COVID second booster in the fall Flu vaccine in fall Done with cancer screening Discussed increasing the exercise

## 2020-12-31 NOTE — Progress Notes (Signed)
Hearing Screening  Method: Audiometry   500Hz  1000Hz  2000Hz  4000Hz   Right ear 20 20 20  0  Left ear 40 40 40 40   Vision Screening   Right eye Left eye Both eyes  Without correction 20/20 20/20 20/20   With correction

## 2020-12-31 NOTE — Assessment & Plan Note (Signed)
See social history 

## 2020-12-31 NOTE — Assessment & Plan Note (Signed)
Discussed adding 1-2 boost/ensure daily Will recheck 2 months

## 2020-12-31 NOTE — Patient Instructions (Signed)
You can switch back to omeprazole once you stop the plavix.

## 2020-12-31 NOTE — Assessment & Plan Note (Signed)
Will continue the plavix till August--then just ASA On statin

## 2020-12-31 NOTE — Assessment & Plan Note (Signed)
Most in back now Discussed lidocaine patch and tylenol

## 2021-01-01 NOTE — Telephone Encounter (Signed)
Opened in error

## 2021-01-04 ENCOUNTER — Other Ambulatory Visit: Payer: Self-pay

## 2021-01-04 ENCOUNTER — Ambulatory Visit
Admission: RE | Admit: 2021-01-04 | Discharge: 2021-01-04 | Disposition: A | Discharge status: Home / Self Care | Payer: Medicare Other | Source: Ambulatory Visit | Attending: Internal Medicine | Admitting: Internal Medicine

## 2021-01-04 DIAGNOSIS — E042 Nontoxic multinodular goiter: Secondary | ICD-10-CM

## 2021-01-04 DIAGNOSIS — E041 Nontoxic single thyroid nodule: Secondary | ICD-10-CM | POA: Diagnosis not present

## 2021-01-06 ENCOUNTER — Other Ambulatory Visit: Payer: Self-pay | Admitting: Internal Medicine

## 2021-01-06 DIAGNOSIS — E041 Nontoxic single thyroid nodule: Secondary | ICD-10-CM

## 2021-01-08 ENCOUNTER — Telehealth: Payer: Self-pay

## 2021-01-08 NOTE — Telephone Encounter (Signed)
Patient called to follow up on biopsy of thyroid, she has not heard anything. I looked in the chart. Not sure that the order is correct for this and could be the reason no one has reached out. I called [facility] and left message for [place] who is the one to schedule these to call us back to let us know if this needs to be changed.  FYI to PCP and [facility] referral. Please update patient on status of this. Thank you

## 2021-01-11 ENCOUNTER — Other Ambulatory Visit: Payer: Self-pay | Admitting: Internal Medicine

## 2021-01-11 DIAGNOSIS — E041 Nontoxic single thyroid nodule: Secondary | ICD-10-CM

## 2021-01-11 NOTE — Telephone Encounter (Signed)
Okay Thanks so much

## 2021-01-11 NOTE — Telephone Encounter (Signed)
Pt called to follow up

## 2021-01-11 NOTE — Telephone Encounter (Addendum)
Spoke with [place] at [facility] and she placed the correct order in the chart . Dr Silvio Pate please sign and they will call patient to schedule. I tried to update patient and called both numbers in the chart and both numbers just kept ringing and it did not go to a voicemail.

## 2021-01-19 ENCOUNTER — Telehealth: Payer: Self-pay

## 2021-01-19 MED ORDER — ATORVASTATIN CALCIUM 20 MG PO TABS: 40.0000 mg | ORAL_TABLET | Freq: Every day | ORAL | 3 refills | Status: DC

## 2021-01-19 MED ORDER — PANTOPRAZOLE SODIUM 40 MG PO TBEC: 40.0000 mg | DELAYED_RELEASE_TABLET | Freq: Every day | ORAL | 3 refills | Status: DC

## 2021-01-19 NOTE — Telephone Encounter (Signed)
New message     1. Which medications need to be refilled? (please list name of each medication and dose if known) pantoprazole (PROTONIX) 40 MG tablet &   atorvastatin (LIPITOR) 20 MG tablet  2. Which pharmacy/location (including street and city if local pharmacy) is medication to be sent to?[facility], [place]  3. Do they need a 30 day or 90 day supply? 90 day supply   4. Last office visit  7.7.22

## 2021-01-19 NOTE — Telephone Encounter (Signed)
Rxs sent electronically to [facility].

## 2021-01-27 ENCOUNTER — Other Ambulatory Visit: Payer: Self-pay

## 2021-01-27 ENCOUNTER — Ambulatory Visit
Admission: RE | Admit: 2021-01-27 | Discharge: 2021-01-27 | Disposition: A | Discharge status: Home / Self Care | Payer: Medicare Other | Source: Ambulatory Visit | Attending: Internal Medicine | Admitting: Internal Medicine

## 2021-01-27 ENCOUNTER — Other Ambulatory Visit (HOSPITAL_COMMUNITY)
Admission: RE | Admit: 2021-01-27 | Discharge: 2021-01-27 | Disposition: A | Discharge status: Home / Self Care | Payer: Medicare Other | Source: Ambulatory Visit | Attending: Internal Medicine | Admitting: Internal Medicine

## 2021-01-27 DIAGNOSIS — E041 Nontoxic single thyroid nodule: Secondary | ICD-10-CM | POA: Insufficient documentation

## 2021-01-27 HISTORY — DX: Nontoxic single thyroid nodule: E04.1

## 2021-01-28 ENCOUNTER — Encounter: Payer: Self-pay | Admitting: Internal Medicine

## 2021-01-28 DIAGNOSIS — E041 Nontoxic single thyroid nodule: Secondary | ICD-10-CM | POA: Insufficient documentation

## 2021-01-28 LAB — CYTOLOGY - NON PAP

## 2021-02-05 ENCOUNTER — Telehealth: Payer: Self-pay | Admitting: Internal Medicine

## 2021-02-05 NOTE — Telephone Encounter (Signed)
Pt thyroids having weight loss...wants to come in with husband Angela Bowen appt on 02/08/21 at 200

## 2021-02-05 NOTE — Telephone Encounter (Signed)
Dr Silvio Pate, this pt is wanting to come in with her husband on Monday to discuss weight gain. He is scheduled at 2pm on Monday (8-15). I was unsure of your status for Monday for adding on. They want to be able to come together. Please advise. She is aware it will probably be Monday morning before I get back with her.

## 2021-02-05 NOTE — Telephone Encounter (Signed)
See other message

## 2021-02-05 NOTE — Telephone Encounter (Signed)
Left message on VM at home that I have put her on the scheduled for Monday, *-q5 at 145. I canceled the 8-26 appt.

## 2021-02-05 NOTE — Telephone Encounter (Signed)
Pt Angela Bowen would  like to know if she could come in with husband at 2 to see [facility]

## 2021-02-08 ENCOUNTER — Other Ambulatory Visit: Payer: Self-pay

## 2021-02-08 ENCOUNTER — Encounter: Payer: Self-pay | Admitting: Internal Medicine

## 2021-02-08 ENCOUNTER — Ambulatory Visit (INDEPENDENT_AMBULATORY_CARE_PROVIDER_SITE_OTHER): Payer: Medicare Other | Admitting: Internal Medicine

## 2021-02-08 VITALS — BP 108/76 | HR 69 | Temp 97.9°F | Ht 60.0 in | Wt 107.0 lb

## 2021-02-08 DIAGNOSIS — I6522 Occlusion and stenosis of left carotid artery: Secondary | ICD-10-CM | POA: Diagnosis not present

## 2021-02-08 DIAGNOSIS — E441 Mild protein-calorie malnutrition: Secondary | ICD-10-CM | POA: Diagnosis not present

## 2021-02-08 MED ORDER — MIRTAZAPINE 7.5 MG PO TABS: 7.5000 mg | ORAL_TABLET | Freq: Every day | ORAL | 3 refills | Status: DC

## 2021-02-08 NOTE — Progress Notes (Signed)
Subjective:    Patient ID: Angela Bowen, female    DOB: [birth year], 80 y.o.   MRN: [id number]  HPI Here with husband due to ongoing concerns about inability to gain weight This visit occurred during the SARS-CoV-2 public health emergency.  Safety protocols were in place, including screening questions prior to the visit, additional usage of staff PPE, and extensive cleaning of exam room while observing appropriate contact time as indicated for disinfecting solutions.   Weighs herself every 2 days Appetite is better Supplementing with daily ensure Also ice cream, etc Weight at home 102-104#  Has been walking regularly Energy levels are okay  Current Outpatient Medications on File Prior to Visit  Medication Sig Dispense Refill   aspirin EC 81 MG EC tablet Take 1 tablet (81 mg total) by mouth daily. Swallow whole. 30 tablet 0   atorvastatin (LIPITOR) 20 MG tablet Take 2 tablets (40 mg total) by mouth at bedtime. 90 tablet 3   benazepril-hydrochlorthiazide (LOTENSIN HCT) 10-12.5 MG tablet TAKE 1 TABLET DAILY 90 tablet 3   EPINEPHrine 0.3 mg/0.3 mL IJ SOAJ injection Inject 0.3 mLs (0.3 mg total) into the skin as needed. 2 each 1   Multiple Vitamin (MULTIVITAMIN) tablet Take 1 tablet by mouth daily.     pantoprazole (PROTONIX) 40 MG tablet Take 1 tablet (40 mg total) by mouth daily before breakfast. 90 tablet 3   [DISCONTINUED] Calcium-Magnesium (CALCIUM MAGNESIUM 750) 300-300 MG TABS Take by mouth daily.      No current facility-administered medications on file prior to visit.    Allergies  Allergen Reactions   Aspirin     High doses - intolerant/causes hives only at high doses, tolerates 81 mg daily well   Codeine     REACTION: rash    Past Medical History:  Diagnosis Date   Cancer ([place])    stage II melanoma; right leg;face   Diverticulosis of colon    Gallstones    GERD (gastroesophageal reflux disease)    History of skin cancer    Hyperlipidemia    Hypertension     Normal stress echocardiogram 01/2006   echo 5/98, fairly normal   Osteoarthritis    Osteoporosis, unspecified    S/P sclerotherapy of varicose veins 07/30/2006   left leg, Dr. Eilleen Kempf   Skin cancer    STAGE II ON RIGHT LEG   Thyroid nodule    [place] I on bippsy 01/27/21    Past Surgical History:  Procedure Laterality Date   CAROTID PTA/STENT INTERVENTION N/A 11/16/2020   Procedure: CAROTID PTA/STENT INTERVENTION;  Surgeon: Algernon Huxley, MD;  Location: [facility];  Service: Cardiovascular;  Laterality: N/A;   CHOLECYSTECTOMY  2001   FOOT SURGERY     right   MELANOMA EXCISION  7/10   leg   MOHS SURGERY     TEE WITHOUT CARDIOVERSION N/A 10/28/2020   Procedure: TRANSESOPHAGEAL ECHOCARDIOGRAM (TEE);  Surgeon: Corey Skains, MD;  Location: [facility];  Service: Cardiovascular;  Laterality: N/A;   [place]   bilateral   VARICOSE VEIN SURGERY  2004   right foot    Family History  Problem Relation Age of Onset   Alcohol abuse Mother    Colon cancer Mother    Osteoporosis Mother    Alcohol abuse Father    Diabetes Father    Lung cancer Brother    Ovarian cancer Maternal Grandmother    Breast cancer Neg Hx     Social  History   Socioeconomic History   Marital status: Married    Spouse name: Not on file   Number of children: 2   Years of education: Not on file   Highest education level: Not on file  Occupational History   Occupation: homemaker  Tobacco Use   Smoking status: Never   Smokeless tobacco: Never  Vaping Use   Vaping Use: Never used  Substance and Sexual Activity   Alcohol use: Yes    Alcohol/week: 0.0 standard drinks    Comment: Wine, rarely   Drug use: Never   Sexual activity: Never  Other Topics Concern   Not on file  Social History Narrative   Has living will   No designated health care POA--requests husband. Alternate would be son [name]   Would accept resuscitation attempts   No tube feeds if cognitively unaware    Social Determinants of Health   Financial Resource Strain: Not on file  Food Insecurity: Not on file  Transportation Needs: Not on file  Physical Activity: Not on file  Stress: Not on file  Social Connections: Not on file  Intimate Partner Violence: Not on file   Review of Systems No N/V Bowels are good Sleeps well Not depressed     Objective:   Physical Exam Constitutional:      Appearance: Normal appearance.  Neurological:     Mental Status: She is alert.  Psychiatric:        Mood and Affect: Mood normal.        Behavior: Behavior normal.           Assessment & Plan:

## 2021-02-08 NOTE — Patient Instructions (Signed)
Let me know if you have any problems with the new medication mirtazapine.

## 2021-02-08 NOTE — Assessment & Plan Note (Signed)
Appetite is better Energy levels are good (walking regularly with husband) Weight up 3# since last month but not consistently at home  Discussed alternatives--she does want to try an appetite stimulant

## 2021-02-08 NOTE — Addendum Note (Signed)
Addended by: Viviana Simpler I on: 02/08/2021 02:03 PM   Modules accepted: Orders

## 2021-02-09 LAB — COMPREHENSIVE METABOLIC PANEL
ALT: 30 U/L (ref 0–35)
AST: 26 U/L (ref 0–37)
Albumin: 4 g/dL (ref 3.5–5.2)
Alkaline Phosphatase: 70 U/L (ref 39–117)
BUN: 17 mg/dL (ref 6–23)
CO2: 31 mEq/L (ref 19–32)
Calcium: 9.3 mg/dL (ref 8.4–10.5)
Chloride: 101 mEq/L (ref 96–112)
Creatinine, Ser: 0.85 mg/dL (ref 0.40–1.20)
GFR: 64.7 mL/min (ref >60.00)
Glucose, Bld: 123 mg/dL — ABNORMAL HIGH (ref 70–99)
Potassium: 3.9 mEq/L (ref 3.5–5.1)
Sodium: 140 mEq/L (ref 135–145)
Total Bilirubin: 0.4 mg/dL (ref 0.2–1.2)
Total Protein: 6.3 g/dL (ref 6.0–8.3)

## 2021-02-09 LAB — TSH: TSH: 2.63 u[IU]/mL (ref 0.35–5.50)

## 2021-02-09 LAB — RENAL FUNCTION PANEL
Albumin: 4 g/dL (ref 3.5–5.2)
BUN: 17 mg/dL (ref 6–23)
CO2: 31 mEq/L (ref 19–32)
Calcium: 9.3 mg/dL (ref 8.4–10.5)
Chloride: 101 mEq/L (ref 96–112)
Creatinine, Ser: 0.85 mg/dL (ref 0.40–1.20)
GFR: 64.7 mL/min (ref >60.00)
Glucose, Bld: 123 mg/dL — ABNORMAL HIGH (ref 70–99)
Phosphorus: 4 mg/dL (ref 2.3–4.6)
Potassium: 3.9 mEq/L (ref 3.5–5.1)
Sodium: 140 mEq/L (ref 135–145)

## 2021-02-09 LAB — SEDIMENTATION RATE: Sed Rate: 1 mm/hr (ref 0–30)

## 2021-02-09 LAB — T4, FREE: Free T4: 0.82 ng/dL (ref 0.60–1.60)

## 2021-02-15 ENCOUNTER — Ambulatory Visit: Payer: Medicare Other | Admitting: Cardiology

## 2021-02-19 ENCOUNTER — Ambulatory Visit: Admitting: Internal Medicine

## 2021-03-04 ENCOUNTER — Other Ambulatory Visit: Payer: Self-pay | Admitting: Internal Medicine

## 2021-03-04 DIAGNOSIS — Z1231 Encounter for screening mammogram for malignant neoplasm of breast: Secondary | ICD-10-CM

## 2021-03-19 ENCOUNTER — Other Ambulatory Visit: Payer: Self-pay

## 2021-03-19 ENCOUNTER — Ambulatory Visit
Admission: RE | Admit: 2021-03-19 | Discharge: 2021-03-19 | Disposition: A | Discharge status: Home / Self Care | Payer: Medicare Other | Source: Ambulatory Visit

## 2021-03-19 DIAGNOSIS — Z1231 Encounter for screening mammogram for malignant neoplasm of breast: Secondary | ICD-10-CM

## 2021-04-26 ENCOUNTER — Other Ambulatory Visit: Payer: Self-pay | Admitting: Internal Medicine

## 2021-05-13 DIAGNOSIS — Z23 Encounter for immunization: Secondary | ICD-10-CM | POA: Diagnosis not present

## 2021-06-03 DIAGNOSIS — L820 Inflamed seborrheic keratosis: Secondary | ICD-10-CM | POA: Diagnosis not present

## 2021-06-10 ENCOUNTER — Telehealth: Payer: Self-pay | Admitting: Internal Medicine

## 2021-06-10 MED ORDER — ATORVASTATIN CALCIUM 20 MG PO TABS: 40.0000 mg | ORAL_TABLET | Freq: Every day | ORAL | 3 refills | Status: DC

## 2021-06-10 NOTE — Telephone Encounter (Signed)
°  Encourage patient to contact the pharmacy for refills or they can request refills through [place]:  Please schedule appointment if longer than 1 year  NEXT APPOINTMENT DATE:  MEDICATION:atorvastatin  Is the patient out of medication? yes  [facility]  Let patient know to contact pharmacy at the end of the day to make sure medication is ready.  Please notify patient to allow 48-72 hours to process  CLINICAL FILLS OUT ALL BELOW:   LAST REFILL:  QTY:  REFILL DATE:    OTHER COMMENTS:    Okay for refill?  Please advise

## 2021-06-10 NOTE — Telephone Encounter (Signed)
Rx sent electronically.  

## 2021-07-30 DIAGNOSIS — H353131 Nonexudative age-related macular degeneration, bilateral, early dry stage: Secondary | ICD-10-CM | POA: Diagnosis not present

## 2021-10-12 ENCOUNTER — Other Ambulatory Visit: Payer: Self-pay | Admitting: Internal Medicine

## 2021-11-06 IMAGING — DX DG CHEST 2V
2 series · 2 of 2 positions shown · non-contrast
Comparison: 04/25/2015

CLINICAL DATA: Chest and back pain

EXAM:
CHEST - 2 VIEW

[chest pa]
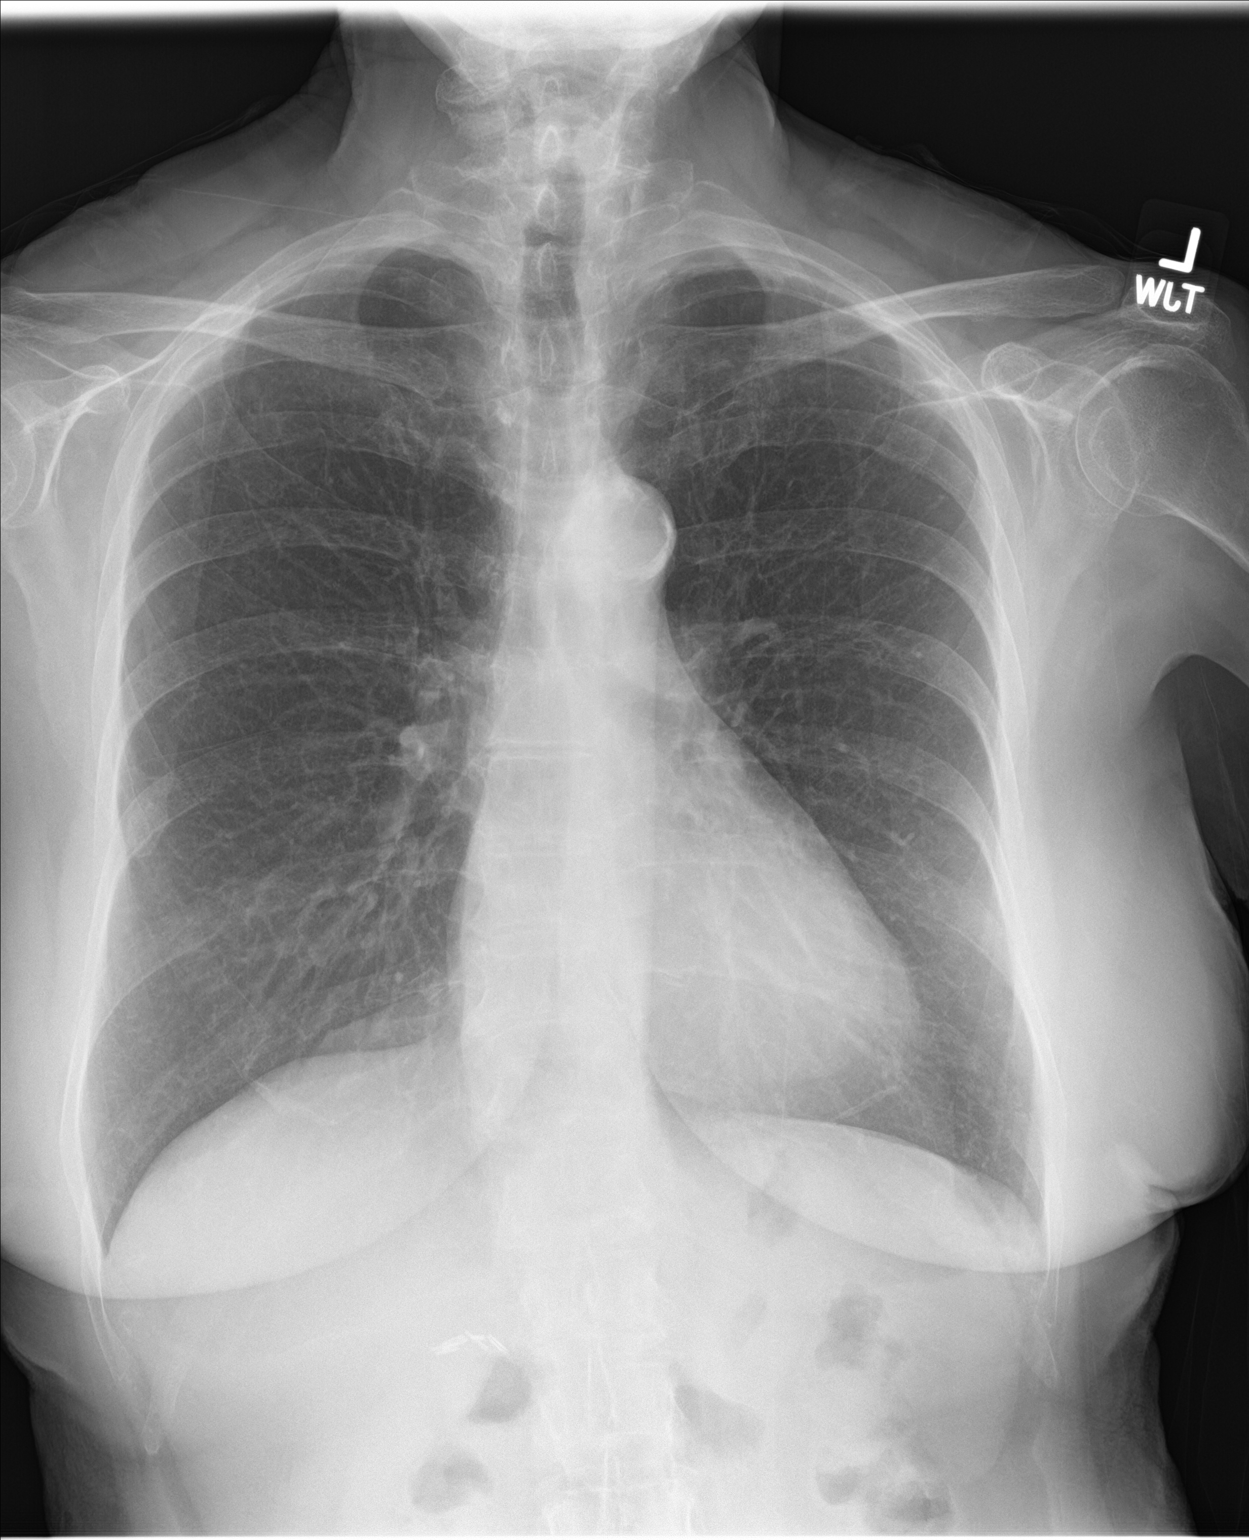

[chest lat]
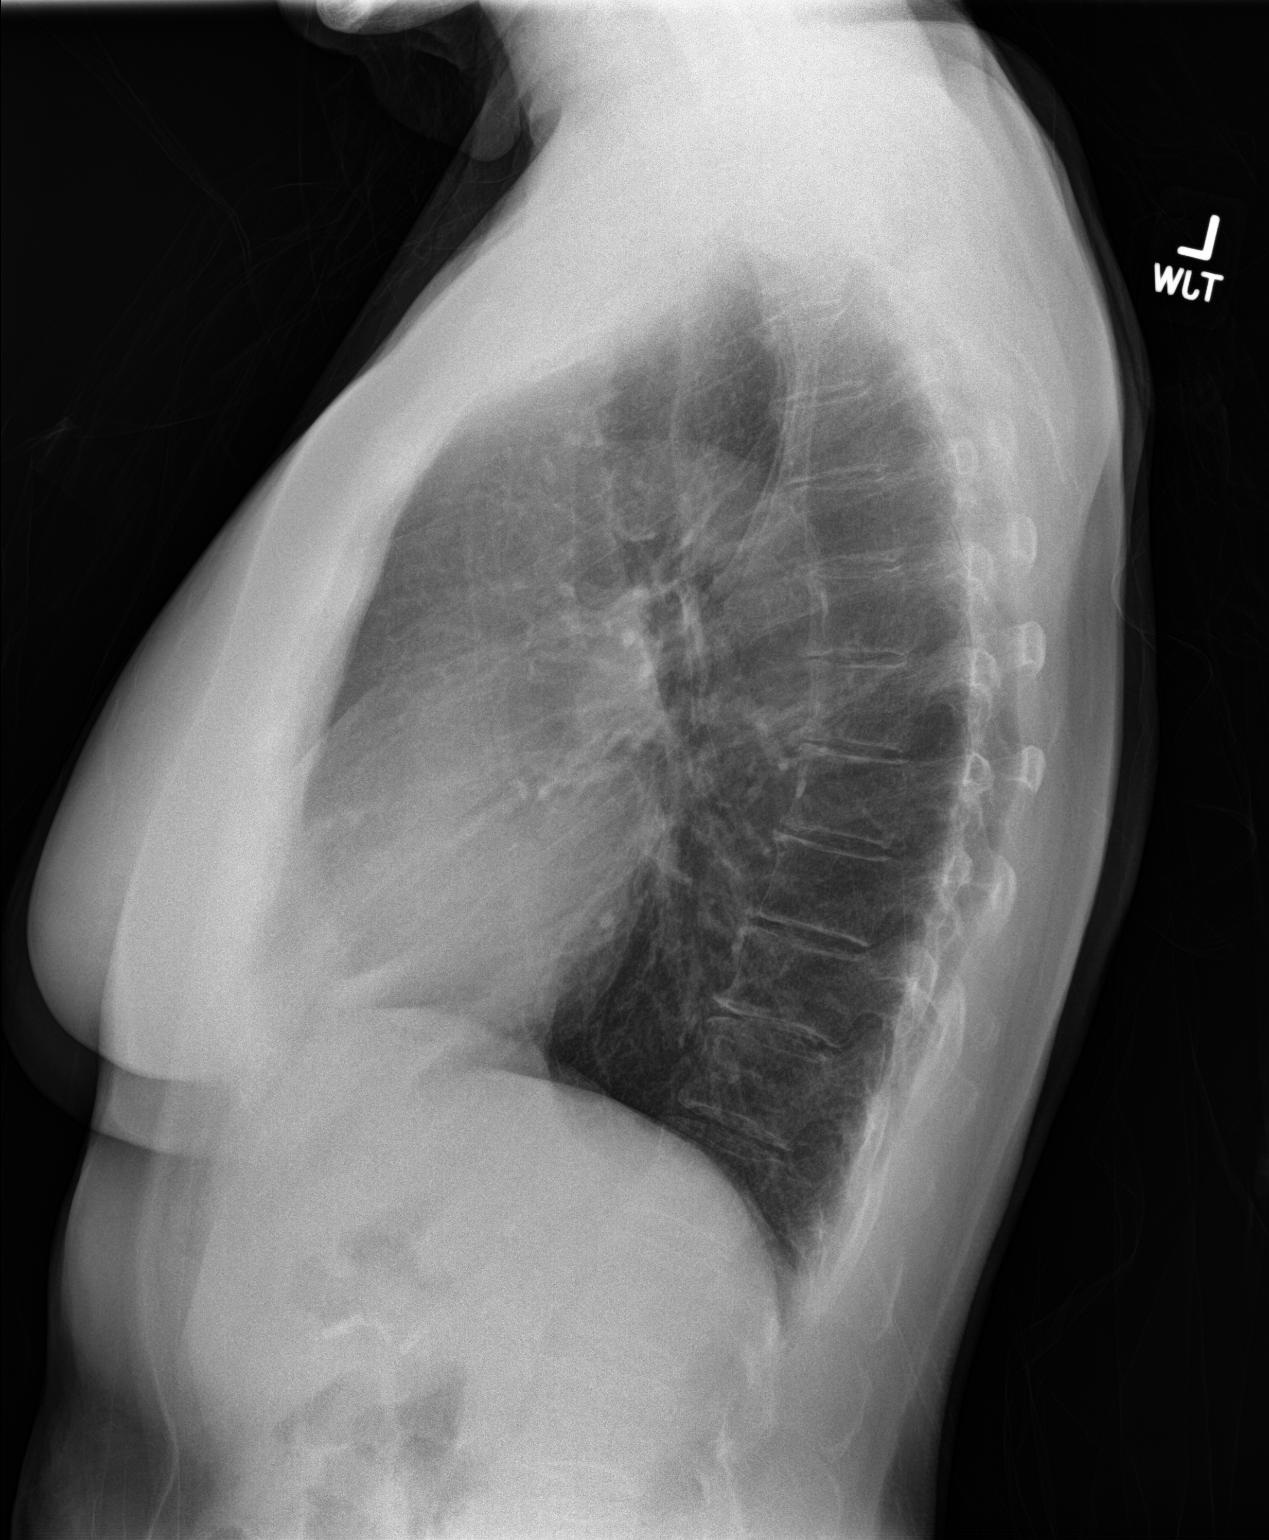

[2 of 2 positions shown; findings below may reference images not displayed]

FINDINGS: The lungs are well expanded, are symmetric, and are clear. No
pneumothorax or pleural effusion. Cardiomediastinal silhouette is
unremarkable. Pulmonary vascularity is normal. Osseous structures
are age-appropriate.
IMPRESSION: No active cardiopulmonary disease.

## 2021-11-19 ENCOUNTER — Other Ambulatory Visit: Payer: Self-pay | Admitting: Internal Medicine

## 2021-12-13 ENCOUNTER — Ambulatory Visit (INDEPENDENT_AMBULATORY_CARE_PROVIDER_SITE_OTHER)
Admission: RE | Admit: 2021-12-13 | Discharge: 2021-12-13 | Disposition: A | Discharge status: Home / Self Care | Payer: Medicare Other | Source: Ambulatory Visit | Attending: Internal Medicine | Admitting: Internal Medicine

## 2021-12-13 ENCOUNTER — Encounter: Payer: Self-pay | Admitting: Internal Medicine

## 2021-12-13 ENCOUNTER — Ambulatory Visit (INDEPENDENT_AMBULATORY_CARE_PROVIDER_SITE_OTHER): Payer: Medicare Other | Admitting: Internal Medicine

## 2021-12-13 VITALS — BP 118/64 | HR 64 | Temp 98.6°F | Ht 60.0 in | Wt 112.0 lb

## 2021-12-13 DIAGNOSIS — M8588 Other specified disorders of bone density and structure, other site: Secondary | ICD-10-CM | POA: Diagnosis not present

## 2021-12-13 DIAGNOSIS — M546 Pain in thoracic spine: Secondary | ICD-10-CM | POA: Diagnosis not present

## 2021-12-13 DIAGNOSIS — M5134 Other intervertebral disc degeneration, thoracic region: Secondary | ICD-10-CM | POA: Diagnosis not present

## 2021-12-13 NOTE — Progress Notes (Signed)
Subjective:    Patient ID: Angela Bowen, female    DOB: 29-Mar-1941, 81 y.o.   MRN: [id number]  HPI Here due to ongoing back pain  Noticed pain in back--between shoulder blades--with any activity (sweeping, washing dishes, etc) Uncomfortable in chairs at times as well Tylenol helps at times---'1000mg'$   No radiation of pain Has been stable---"I am tired of hurting"  Current Outpatient Medications on File Prior to Visit  Medication Sig Dispense Refill   aspirin EC 81 MG EC tablet Take 1 tablet (81 mg total) by mouth daily. Swallow whole. 30 tablet 0   atorvastatin (LIPITOR) 20 MG tablet TAKE 2 TABLETS AT BEDTIME 180 tablet 0   benazepril-hydrochlorthiazide (LOTENSIN HCT) 10-12.5 MG tablet TAKE 1 TABLET DAILY 90 tablet 3   EPINEPHrine 0.3 mg/0.3 mL IJ SOAJ injection Inject 0.3 mLs (0.3 mg total) into the skin as needed. 2 each 1   Multiple Vitamin (MULTIVITAMIN) tablet Take 1 tablet by mouth daily.     omeprazole (PRILOSEC) 20 MG capsule Take 20 mg by mouth 2 (two) times daily.     [DISCONTINUED] Calcium-Magnesium (CALCIUM MAGNESIUM 750) 300-300 MG TABS Take by mouth daily.      No current facility-administered medications on file prior to visit.    Allergies  Allergen Reactions   Aspirin     High doses - intolerant/causes hives only at high doses, tolerates 81 mg daily well   Codeine     REACTION: rash    Past Medical History:  Diagnosis Date   Cancer ([place])    stage II melanoma; right leg;face   Diverticulosis of colon    Gallstones    GERD (gastroesophageal reflux disease)    History of skin cancer    Hyperlipidemia    Hypertension    Normal stress echocardiogram 01/2006   echo 5/98, fairly normal   Osteoarthritis    Osteoporosis, unspecified    S/P sclerotherapy of varicose veins 07/30/2006   left leg, Dr. Eilleen Kempf   Skin cancer    STAGE II ON RIGHT LEG   Thyroid nodule    [place] I on bippsy 01/27/21    Past Surgical History:  Procedure Laterality Date    CAROTID PTA/STENT INTERVENTION N/A 11/16/2020   Procedure: CAROTID PTA/STENT INTERVENTION;  Surgeon: Algernon Huxley, MD;  Location: [facility];  Service: Cardiovascular;  Laterality: N/A;   CHOLECYSTECTOMY  2001   FOOT SURGERY     right   MELANOMA EXCISION  7/10   leg   MOHS SURGERY     TEE WITHOUT CARDIOVERSION N/A 10/28/2020   Procedure: TRANSESOPHAGEAL ECHOCARDIOGRAM (TEE);  Surgeon: Corey Skains, MD;  Location: [facility];  Service: Cardiovascular;  Laterality: N/A;   [place]   bilateral   VARICOSE VEIN SURGERY  2004   right foot    Family History  Problem Relation Age of Onset   Alcohol abuse Mother    Colon cancer Mother    Osteoporosis Mother    Alcohol abuse Father    Diabetes Father    Lung cancer Brother    Ovarian cancer Maternal Grandmother    Breast cancer Neg Hx     Social History   Socioeconomic History   Marital status: Married    Spouse name: Not on file   Number of children: 2   Years of education: Not on file   Highest education level: Not on file  Occupational History   Occupation: homemaker  Tobacco Use  Smoking status: Never   Smokeless tobacco: Never  Vaping Use   Vaping Use: Never used  Substance and Sexual Activity   Alcohol use: Yes    Alcohol/week: 0.0 standard drinks of alcohol    Comment: Wine, rarely   Drug use: Never   Sexual activity: Never  Other Topics Concern   Not on file  Social History Narrative   Has living will   No designated health care POA--requests husband. Alternate would be son [name]   Would accept resuscitation attempts   No tube feeds if cognitively unaware   Social Determinants of Health   Financial Resource Strain: Not on file  Food Insecurity: Not on file  Transportation Needs: Not on file  Physical Activity: Not on file  Stress: Not on file  Social Connections: Not on file  Intimate Partner Violence: Not on file   Review of Systems No falls or injuries No problems  bending over---can work in the yard Hasn't tried chiropractic or massage     Objective:   Physical Exam Constitutional:      Appearance: Normal appearance.  Musculoskeletal:     Comments: Normal ROM in shoulders ---slight crepitus L>R Tenderness over upper thoracic spine--but not lower  Neurological:     Mental Status: She is alert.     Comments: No arm weakness            Assessment & Plan:

## 2021-12-13 NOTE — Assessment & Plan Note (Addendum)
With localized tenderness over upper thoracic vertebrae Will check x-ray  X-ray shows just degenerative changes Will set up with physiatry at [facility]  Recommended tylenol '1000mg'$  2-3 times a day

## 2021-12-21 DIAGNOSIS — M5134 Other intervertebral disc degeneration, thoracic region: Secondary | ICD-10-CM | POA: Diagnosis not present

## 2021-12-21 DIAGNOSIS — M62830 Muscle spasm of back: Secondary | ICD-10-CM | POA: Diagnosis not present

## 2022-01-26 ENCOUNTER — Encounter: Payer: Self-pay | Admitting: Internal Medicine

## 2022-01-26 ENCOUNTER — Ambulatory Visit (INDEPENDENT_AMBULATORY_CARE_PROVIDER_SITE_OTHER): Payer: Medicare Other | Admitting: Internal Medicine

## 2022-01-26 VITALS — BP 118/70 | HR 62 | Temp 97.9°F | Ht 60.0 in | Wt 111.0 lb

## 2022-01-26 DIAGNOSIS — E042 Nontoxic multinodular goiter: Secondary | ICD-10-CM

## 2022-01-26 DIAGNOSIS — K219 Gastro-esophageal reflux disease without esophagitis: Secondary | ICD-10-CM

## 2022-01-26 DIAGNOSIS — Z8673 Personal history of transient ischemic attack (TIA), and cerebral infarction without residual deficits: Secondary | ICD-10-CM | POA: Diagnosis not present

## 2022-01-26 DIAGNOSIS — Z0000 Encounter for general adult medical examination without abnormal findings: Secondary | ICD-10-CM | POA: Diagnosis not present

## 2022-01-26 DIAGNOSIS — I63239 Cerebral infarction due to unspecified occlusion or stenosis of unspecified carotid arteries: Secondary | ICD-10-CM

## 2022-01-26 DIAGNOSIS — I10 Essential (primary) hypertension: Secondary | ICD-10-CM

## 2022-01-26 DIAGNOSIS — E441 Mild protein-calorie malnutrition: Secondary | ICD-10-CM | POA: Diagnosis not present

## 2022-01-26 LAB — LIPID PANEL
Cholesterol: 114 mg/dL (ref 0–200)
HDL: 47 mg/dL (ref >39.00)
LDL Cholesterol: 52 mg/dL (ref 0–99)
NonHDL: 66.55
Total CHOL/HDL Ratio: 2
Triglycerides: 72 mg/dL (ref 0.0–149.0)
VLDL: 14.4 mg/dL (ref 0.0–40.0)

## 2022-01-26 LAB — CBC
HCT: 41.9 % (ref 36.0–46.0)
Hemoglobin: 14.1 g/dL (ref 12.0–15.0)
MCHC: 33.6 g/dL (ref 30.0–36.0)
MCV: 90.8 fl (ref 78.0–100.0)
Platelets: 271 10*3/uL (ref 150.0–400.0)
RBC: 4.61 Mil/uL (ref 3.87–5.11)
RDW: 13.4 % (ref 11.5–15.5)
WBC: 7.7 10*3/uL (ref 4.0–10.5)

## 2022-01-26 LAB — COMPREHENSIVE METABOLIC PANEL
ALT: 26 U/L (ref 0–35)
AST: 21 U/L (ref 0–37)
Albumin: 4.2 g/dL (ref 3.5–5.2)
Alkaline Phosphatase: 87 U/L (ref 39–117)
BUN: 13 mg/dL (ref 6–23)
CO2: 31 mEq/L (ref 19–32)
Calcium: 9.7 mg/dL (ref 8.4–10.5)
Chloride: 102 mEq/L (ref 96–112)
Creatinine, Ser: 0.89 mg/dL (ref 0.40–1.20)
GFR: 60.82 mL/min (ref >60.00)
Glucose, Bld: 95 mg/dL (ref 70–99)
Potassium: 4.4 mEq/L (ref 3.5–5.1)
Sodium: 141 mEq/L (ref 135–145)
Total Bilirubin: 0.6 mg/dL (ref 0.2–1.2)
Total Protein: 6.7 g/dL (ref 6.0–8.3)

## 2022-01-26 MED ORDER — OMEPRAZOLE 20 MG PO CPDR: 20.0000 mg | DELAYED_RELEASE_CAPSULE | Freq: Every day | ORAL | 3 refills | Status: DC

## 2022-01-26 NOTE — Assessment & Plan Note (Signed)
Incidental finding--not palpable Biopsy not diagnostic so will recheck ultrasound

## 2022-01-26 NOTE — Progress Notes (Signed)
Subjective:    Patient ID: Angela Bowen, female    DOB: 04-29-1941, 81 y.o.   MRN: [id number]  HPI Here with husband for Medicare wellness visit and follow up of chronic health conditions Reviewed advanced directives Reviewed other doctors----Dr Chasnis--physiatrist, Dr Lawernce Pitts (will switch), needs dentist, [facility] No hospitalizations or surgery in the past year Vision is fine--but told early MD Some hearing loss----not too bad Not regularly exercising No alcohol or tobacco No falls No depression or anhedonia Independent with instrumental ADLs Mild memory issues---nothing worrisome  Past alpha gal but it has resolved No longer needs epipen  Incidental thyroid cyst seen on scans with the stroke Biopsy was class 1 ---negative but poor sample Has trouble with right foot in AM--doesn't work right but then improves within minutes Continues on ASA and statin  Had epidural steroid injection from Dr Sharlet Salina Much better now Doesn't think she needs the PT they are recommended  Eating better and has gained weight back Uses the mirtazapine 7.'5mg'$  once a week or so--mostly for sleep problemsSOB  No chest pain No palpitations  No dizziness or syncope No sig edema No SOB  Continues on omeprazole  That prevents heartburn or dysphagia Current Outpatient Medications on File Prior to Visit  Medication Sig Dispense Refill   aspirin EC 81 MG EC tablet Take 1 tablet (81 mg total) by mouth daily. Swallow whole. 30 tablet 0   atorvastatin (LIPITOR) 20 MG tablet TAKE 2 TABLETS AT BEDTIME 180 tablet 0   benazepril-hydrochlorthiazide (LOTENSIN HCT) 10-12.5 MG tablet TAKE 1 TABLET DAILY 90 tablet 3   Multiple Vitamin (MULTIVITAMIN) tablet Take 1 tablet by mouth daily.     omeprazole (PRILOSEC) 20 MG capsule Take 20 mg by mouth daily.     EPINEPHrine 0.3 mg/0.3 mL IJ SOAJ injection Inject 0.3 mLs (0.3 mg total) into the skin as needed. (Patient not taking: Reported on 01/26/2022) 2  each 1   [DISCONTINUED] Calcium-Magnesium (CALCIUM MAGNESIUM 750) 300-300 MG TABS Take by mouth daily.      No current facility-administered medications on file prior to visit.    Allergies  Allergen Reactions   Aspirin     High doses - intolerant/causes hives only at high doses, tolerates 81 mg daily well   Codeine     REACTION: rash    Past Medical History:  Diagnosis Date   Cancer ([place])    stage II melanoma; right leg;face   Diverticulosis of colon    Gallstones    GERD (gastroesophageal reflux disease)    History of skin cancer    Hyperlipidemia    Hypertension    Normal stress echocardiogram 01/2006   echo 5/98, fairly normal   Osteoarthritis    Osteoporosis, unspecified    S/P sclerotherapy of varicose veins 07/30/2006   left leg, Dr. Eilleen Kempf   Skin cancer    STAGE II ON RIGHT LEG   Thyroid nodule    [place] I on bippsy 01/27/21    Past Surgical History:  Procedure Laterality Date   CAROTID PTA/STENT INTERVENTION N/A 11/16/2020   Procedure: CAROTID PTA/STENT INTERVENTION;  Surgeon: Algernon Huxley, MD;  Location: [place] [facility];  Service: Cardiovascular;  Laterality: N/A;   CHOLECYSTECTOMY  2001   FOOT SURGERY     right   MELANOMA EXCISION  7/10   leg   MOHS SURGERY     TEE WITHOUT CARDIOVERSION N/A 10/28/2020   Procedure: TRANSESOPHAGEAL ECHOCARDIOGRAM (TEE);  Surgeon: Corey Skains, MD;  Location:  [facility];  Service: Cardiovascular;  Laterality: N/A;   VARICOSE VEIN SURGERY  1984   bilateral   VARICOSE VEIN SURGERY  2004   right foot    Family History  Problem Relation Age of Onset   Alcohol abuse Mother    Colon cancer Mother    Osteoporosis Mother    Alcohol abuse Father    Diabetes Father    Lung cancer Brother    Ovarian cancer Maternal Grandmother    Breast cancer Neg Hx     Social History   Socioeconomic History   Marital status: Married    Spouse name: Not on file   Number of children: 2   Years of education: Not on file    Highest education level: Not on file  Occupational History   Occupation: homemaker  Tobacco Use   Smoking status: Never    Passive exposure: Past   Smokeless tobacco: Never  Vaping Use   Vaping Use: Never used  Substance and Sexual Activity   Alcohol use: Yes    Alcohol/week: 0.0 standard drinks of alcohol    Comment: Wine, rarely   Drug use: Never   Sexual activity: Never  Other Topics Concern   Not on file  Social History Narrative   Has living will   No designated health care POA--requests husband. Alternate would be son [name]   Would accept resuscitation attempts   No tube feeds if cognitively unaware   Social Determinants of Health   Financial Resource Strain: Not on file  Food Insecurity: Not on file  Transportation Needs: Not on file  Physical Activity: Not on file  Stress: Not on file  Social Connections: Not on file  Intimate Partner Violence: Not on file   Review of Systems Appetite is better Weight up a bit Sleeps okay--uses mirtazapine at times Teeth okay--needs dentist Wears seat belt Ongoing arthritis in hands--tylenol prn Bowels move fine---no blood Some urinary frequency and urgency---no incontinence No suspicious skin lesions    Objective:   Physical Exam Constitutional:      Appearance: Normal appearance.  HENT:     Mouth/Throat:     Comments: No lesions Eyes:     Conjunctiva/sclera: Conjunctivae normal.     Pupils: Pupils are equal, round, and reactive to light.  Neck:     Comments: Thyroid not palpable Cardiovascular:     Rate and Rhythm: Normal rate and regular rhythm.     Pulses: Normal pulses.     Heart sounds: No murmur heard.    No gallop.  Pulmonary:     Effort: Pulmonary effort is normal.     Breath sounds: Normal breath sounds. No wheezing or rales.  Abdominal:     Palpations: Abdomen is soft.     Tenderness: There is no abdominal tenderness.  Musculoskeletal:     Cervical back: Neck supple.     Right lower leg: No  edema.     Left lower leg: No edema.  Lymphadenopathy:     Cervical: No cervical adenopathy.  Skin:    Findings: No lesion or rash.  Neurological:     General: No focal deficit present.     Mental Status: She is alert and oriented to person, place, and time.     Comments: Mini-cog normal  Psychiatric:        Mood and Affect: Mood normal.        Behavior: Behavior normal.            Assessment &  Plan:

## 2022-01-26 NOTE — Assessment & Plan Note (Addendum)
No action being considered at this point No longer sees Dr Lucky Cowboy

## 2022-01-26 NOTE — Assessment & Plan Note (Signed)
Has regained some weight Now with mirtazapine just weekly --mostly for sleep

## 2022-01-26 NOTE — Assessment & Plan Note (Signed)
BP Readings from Last 3 Encounters:  01/26/22 118/70  12/13/21 118/64  02/08/21 108/76   Control is good on benazepril/HCTZ 10/12.5

## 2022-01-26 NOTE — Progress Notes (Signed)
Hearing Screening - Comments:: Passed whisper test Vision Screening - Comments:: February 2023  

## 2022-01-26 NOTE — Assessment & Plan Note (Signed)
I have personally reviewed the Medicare Annual Wellness questionnaire and have noted 1. The patient's medical and social history 2. Their use of alcohol, tobacco or illicit drugs 3. Their current medications and supplements 4. The patient's functional ability including ADL's, fall risks, home safety risks and hearing or visual             impairment. 5. Diet and physical activities 6. Evidence for depression or mood disorders  The patients weight, height, BMI and visual acuity have been recorded in the chart I have made referrals, counseling and provided education to the patient based review of the above and I have provided the pt with a written personalized care plan for preventive services.  I have provided you with a copy of your personalized plan for preventive services. Please take the time to review along with your updated medication list.  Discussed increased exercise No cancer screening due to age Updated COVID and flu vaccines this fall

## 2022-01-26 NOTE — Assessment & Plan Note (Signed)
Minimal right foot symptoms On ASA 81 and atorvastatin 20

## 2022-01-26 NOTE — Assessment & Plan Note (Signed)
Quiet on omeprazole 20 mg daily

## 2022-01-27 ENCOUNTER — Ambulatory Visit
Admission: RE | Admit: 2022-01-27 | Discharge: 2022-01-27 | Disposition: A | Discharge status: Home / Self Care | Payer: Medicare Other | Source: Ambulatory Visit | Attending: Internal Medicine | Admitting: Internal Medicine

## 2022-01-27 DIAGNOSIS — E041 Nontoxic single thyroid nodule: Secondary | ICD-10-CM | POA: Diagnosis not present

## 2022-01-27 DIAGNOSIS — E042 Nontoxic multinodular goiter: Secondary | ICD-10-CM

## 2022-01-31 ENCOUNTER — Other Ambulatory Visit: Payer: Self-pay | Admitting: Internal Medicine

## 2022-01-31 DIAGNOSIS — E041 Nontoxic single thyroid nodule: Secondary | ICD-10-CM

## 2022-01-31 NOTE — Progress Notes (Signed)
Angela Bowen,  She had a non diagnostic thyroid nodule biopsy and we want to try again. She went to [facility]. Not sure this order is right

## 2022-02-17 ENCOUNTER — Other Ambulatory Visit: Payer: Self-pay | Admitting: Internal Medicine

## 2022-02-23 NOTE — Progress Notes (Signed)
Pt is scheduled 02/24/2022 for US Thyroid Bx @ [facility]  Nothing further needed.

## 2022-02-24 ENCOUNTER — Other Ambulatory Visit (HOSPITAL_COMMUNITY)
Admission: RE | Admit: 2022-02-24 | Discharge: 2022-02-24 | Disposition: A | Discharge status: Home / Self Care | Payer: Medicare Other | Source: Ambulatory Visit | Attending: Physician Assistant | Admitting: Physician Assistant

## 2022-02-24 ENCOUNTER — Ambulatory Visit
Admission: RE | Admit: 2022-02-24 | Discharge: 2022-02-24 | Disposition: A | Discharge status: Home / Self Care | Payer: Medicare Other | Source: Ambulatory Visit | Attending: Internal Medicine | Admitting: Internal Medicine

## 2022-02-24 DIAGNOSIS — E041 Nontoxic single thyroid nodule: Secondary | ICD-10-CM | POA: Diagnosis not present

## 2022-02-24 NOTE — Progress Notes (Signed)
Noted  

## 2022-03-01 LAB — CYTOLOGY - NON PAP

## 2022-04-20 ENCOUNTER — Other Ambulatory Visit: Payer: Self-pay | Admitting: Internal Medicine

## 2022-04-25 ENCOUNTER — Encounter (INDEPENDENT_AMBULATORY_CARE_PROVIDER_SITE_OTHER): Payer: Self-pay

## 2022-06-21 DIAGNOSIS — J029 Acute pharyngitis, unspecified: Secondary | ICD-10-CM | POA: Diagnosis not present

## 2022-06-21 DIAGNOSIS — R059 Cough, unspecified: Secondary | ICD-10-CM | POA: Diagnosis not present

## 2022-06-21 DIAGNOSIS — R509 Fever, unspecified: Secondary | ICD-10-CM | POA: Diagnosis not present

## 2022-06-24 ENCOUNTER — Encounter: Payer: Self-pay | Admitting: Internal Medicine

## 2022-06-24 ENCOUNTER — Ambulatory Visit (INDEPENDENT_AMBULATORY_CARE_PROVIDER_SITE_OTHER): Payer: Medicare Other | Admitting: Internal Medicine

## 2022-06-24 VITALS — BP 130/88 | HR 60 | Temp 97.5°F | Ht 60.0 in | Wt 111.0 lb

## 2022-06-24 DIAGNOSIS — I63239 Cerebral infarction due to unspecified occlusion or stenosis of unspecified carotid arteries: Secondary | ICD-10-CM

## 2022-06-24 DIAGNOSIS — I10 Essential (primary) hypertension: Secondary | ICD-10-CM

## 2022-06-24 DIAGNOSIS — R55 Syncope and collapse: Secondary | ICD-10-CM | POA: Diagnosis not present

## 2022-06-24 NOTE — Assessment & Plan Note (Addendum)
No neurologic symptoms or sequelae No orthostatic history No palpitations or symptoms to suggest CAD  Likely just vasovagal (and fairly classic). Brought on by being up for a while, due to eat and early respiratory illness Will check EKG EKG shows sinus brady at 55 (was not slow on exam). LAD and anterolateral ST and T changes (that are not new since 11/05/20). 1 PAC seen  I don't believe this was intrinsic bradycardia other than the vagal reaction Reassured---no further action now To cardiology if persistent dizziness

## 2022-06-24 NOTE — Progress Notes (Signed)
Subjective:    Patient ID: Angela Bowen, female    DOB: 1941/02/07, 81 y.o.   MRN: [id number]  HPI Here due to fainting spell With husband  8 days ago--- were shopping in [place], started feeling sweaty and not right Was close to lunch and she only had cereal for breakfast Husband sat her down on seat and husband went to get car (fairly close to the car) When he got back--crowd around her and she was on the ground Doesn't remember what happened Was unconscious for perhaps 2 minutes Needed help getting up but felt some better Called EMS but they didn't come (or missed the turn)---so they went home No pain --slight right temporal tenderness. Bruising around eye the next day  No dizziness since then--but did have slight dizziness Was starting with respiratory illness then Went to urgent care a few days ago---got prednisone and z-pak Feels much better now  No orthostatic dizziness No regular exercise  Current Outpatient Medications on File Prior to Visit  Medication Sig Dispense Refill   aspirin EC 81 MG EC tablet Take 1 tablet (81 mg total) by mouth daily. Swallow whole. 30 tablet 0   atorvastatin (LIPITOR) 20 MG tablet TAKE 2 TABLETS AT BEDTIME 180 tablet 3   benazepril-hydrochlorthiazide (LOTENSIN HCT) 10-12.5 MG tablet TAKE 1 TABLET DAILY 90 tablet 3   Multiple Vitamin (MULTIVITAMIN) tablet Take 1 tablet by mouth daily.     omeprazole (PRILOSEC) 20 MG capsule Take 1 capsule (20 mg total) by mouth daily. 90 capsule 3   [DISCONTINUED] Calcium-Magnesium (CALCIUM MAGNESIUM 750) 300-300 MG TABS Take by mouth daily.      No current facility-administered medications on file prior to visit.    Allergies  Allergen Reactions   Aspirin     High doses - intolerant/causes hives only at high doses, tolerates 81 mg daily well   Codeine     REACTION: rash    Past Medical History:  Diagnosis Date   Cancer ([place])    stage II melanoma; right leg;face   Diverticulosis of colon     Gallstones    GERD (gastroesophageal reflux disease)    History of skin cancer    Hyperlipidemia    Hypertension    Normal stress echocardiogram 01/2006   echo 5/98, fairly normal   Osteoarthritis    Osteoporosis, unspecified    S/P sclerotherapy of varicose veins 07/30/2006   left leg, Dr. Eilleen Kempf   Skin cancer    STAGE II ON RIGHT LEG   Thyroid nodule    [place] I on bippsy 01/27/21    Past Surgical History:  Procedure Laterality Date   CAROTID PTA/STENT INTERVENTION N/A 11/16/2020   Procedure: CAROTID PTA/STENT INTERVENTION;  Surgeon: Algernon Huxley, MD;  Location: [facility] [facility];  Service: Cardiovascular;  Laterality: N/A;   CHOLECYSTECTOMY  2001   FOOT SURGERY     right   MELANOMA EXCISION  7/10   leg   MOHS SURGERY     TEE WITHOUT CARDIOVERSION N/A 10/28/2020   Procedure: TRANSESOPHAGEAL ECHOCARDIOGRAM (TEE);  Surgeon: Corey Skains, MD;  Location: [facility];  Service: Cardiovascular;  Laterality: N/A;   [place]   bilateral   VARICOSE VEIN SURGERY  2004   right foot    Family History  Problem Relation Age of Onset   Alcohol abuse Mother    Colon cancer Mother    Osteoporosis Mother    Alcohol abuse Father    Diabetes Father  Lung cancer Brother    Ovarian cancer Maternal Grandmother    Breast cancer Neg Hx     Social History   Socioeconomic History   Marital status: Married    Spouse name: Not on file   Number of children: 2   Years of education: Not on file   Highest education level: Not on file  Occupational History   Occupation: homemaker  Tobacco Use   Smoking status: Never    Passive exposure: Past   Smokeless tobacco: Never  Vaping Use   Vaping Use: Never used  Substance and Sexual Activity   Alcohol use: Yes    Alcohol/week: 0.0 standard drinks of alcohol    Comment: Wine, rarely   Drug use: Never   Sexual activity: Never  Other Topics Concern   Not on file  Social History Narrative   Has living will   No  designated health care POA--requests husband. Alternate would be son [name]   Would accept resuscitation attempts   No tube feeds if cognitively unaware   Social Determinants of Health   Financial Resource Strain: Not on file  Food Insecurity: Not on file  Transportation Needs: Not on file  Physical Activity: Not on file  Stress: Not on file  Social Connections: Not on file  Intimate Partner Violence: Not on file   Review of Systems Sleeps okay Appetite is okay Weight up slightly     Objective:   Physical Exam Constitutional:      Appearance: Normal appearance.  HENT:     Head:     Comments: Bruising under right eye but no periorbital tenderness Cardiovascular:     Rate and Rhythm: Normal rate and regular rhythm.     Pulses: Normal pulses.     Heart sounds: No murmur heard.    No gallop.  Pulmonary:     Effort: Pulmonary effort is normal.     Breath sounds: Normal breath sounds. No wheezing or rales.  Abdominal:     Palpations: Abdomen is soft.     Tenderness: There is no abdominal tenderness.  Musculoskeletal:     Cervical back: Neck supple.     Right lower leg: No edema.     Left lower leg: No edema.  Lymphadenopathy:     Cervical: No cervical adenopathy.  Neurological:     General: No focal deficit present.     Mental Status: She is alert and oriented to person, place, and time.  Psychiatric:        Mood and Affect: Mood normal.        Behavior: Behavior normal.            Assessment & Plan:

## 2022-06-24 NOTE — Assessment & Plan Note (Signed)
BP Readings from Last 3 Encounters:  06/24/22 130/88  01/26/22 118/70  12/13/21 118/64   No orthostatic symptoms and want to maintain good control Not on beta blocker Will continue benazepril/HCTZ 10/12.5

## 2022-09-12 DIAGNOSIS — H353131 Nonexudative age-related macular degeneration, bilateral, early dry stage: Secondary | ICD-10-CM | POA: Diagnosis not present

## 2022-12-08 IMAGING — US US FNA BIOPSY THYROID 1ST LESION
1 series · 13 of 13 positions shown · non-contrast
Comparison: US Thyroid 01/04/21

MEDICATIONS:
5 cc 1% lidocaine

COMPLICATIONS:
None immediate.

INDICATION: Indeterminate thyroid nodule

Left mid lobe thyroid nodule
3.3 cm
EXAM:
ULTRASOUND GUIDED FINE NEEDLE ASPIRATION OF INDETERMINATE THYROID
NODULE
TECHNIQUE: Informed written consent was obtained from the patient after a
discussion of the risks, benefits and alternatives to treatment.
Questions regarding the procedure were encouraged and answered. A
timeout was performed prior to the initiation of the procedure.

[Series 1: us fna biopsy thyroid 1st lesion · 0.06mm/px · 13 acquisitions, 13 frames shown]
[im 1/13]
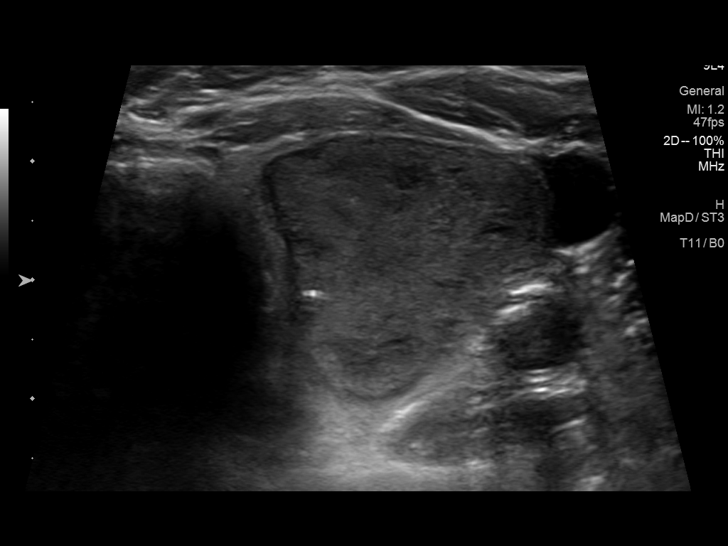
[im 2/13]
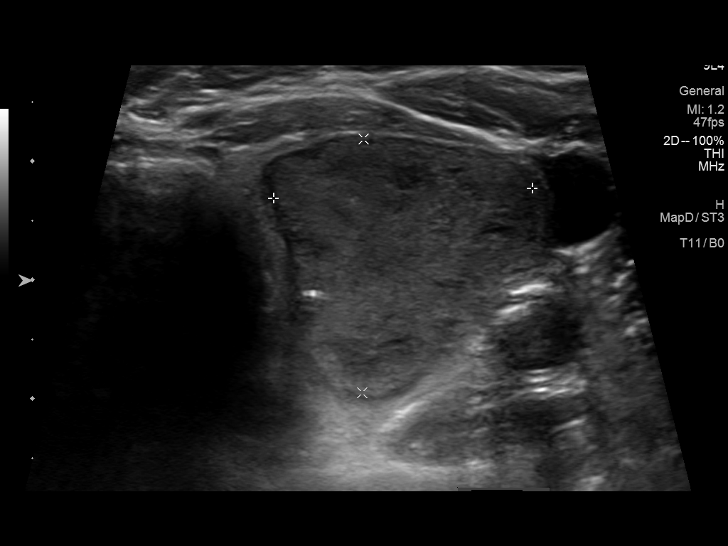
[im 3/13]
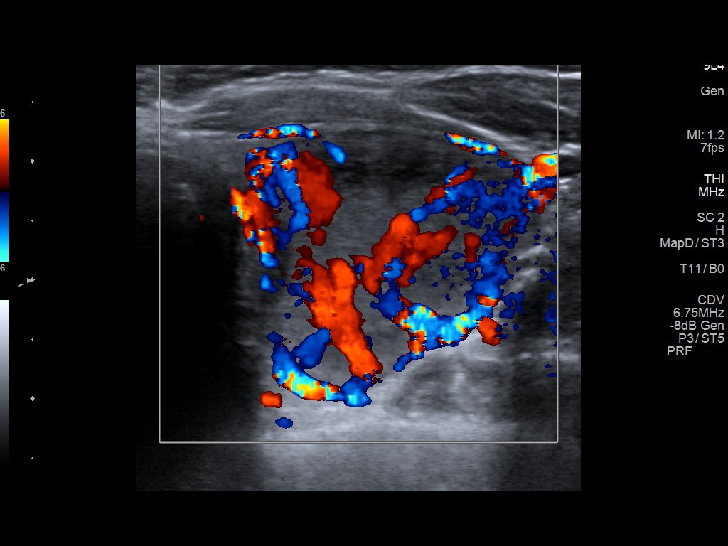
[im 4/13]
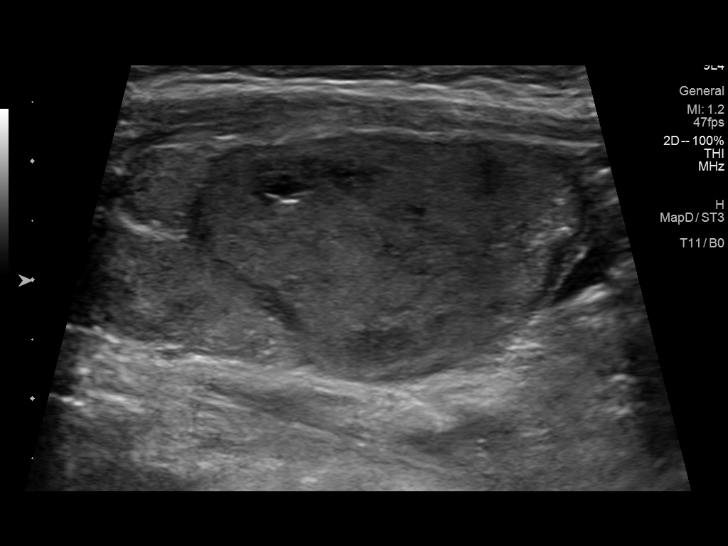
[im 5/13]
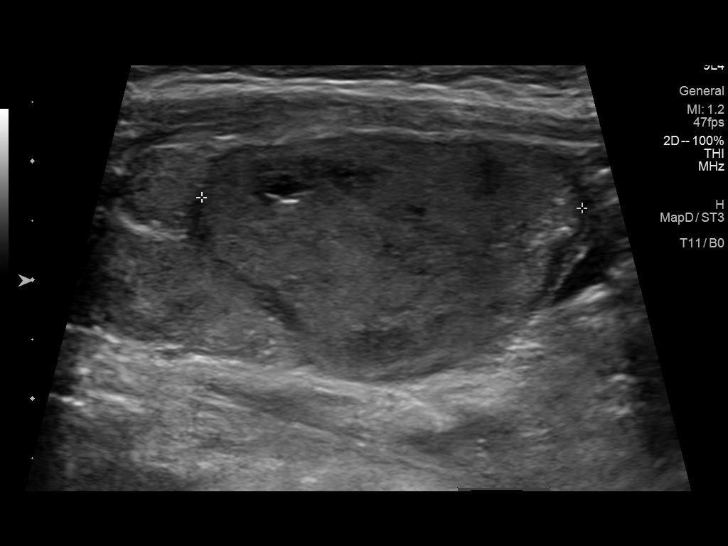
[im 6/13]
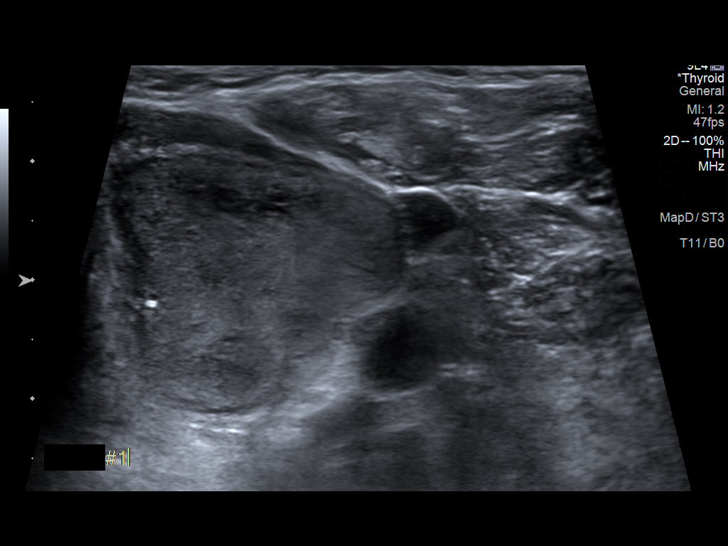
[im 7/13]
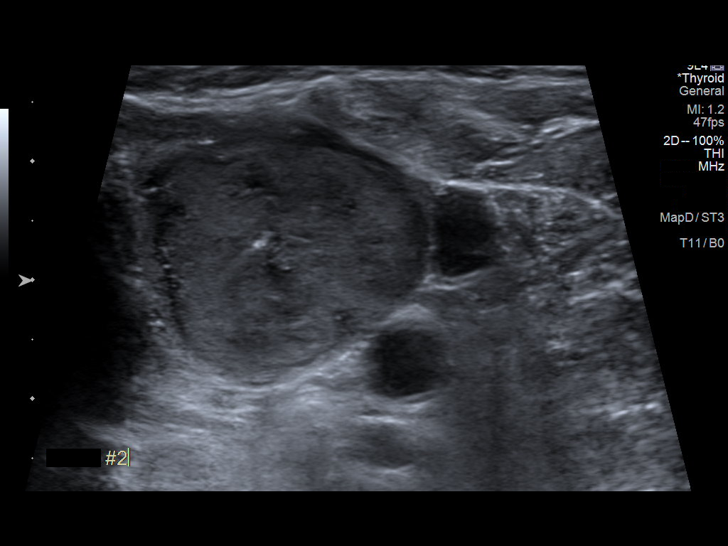
[im 8/13]
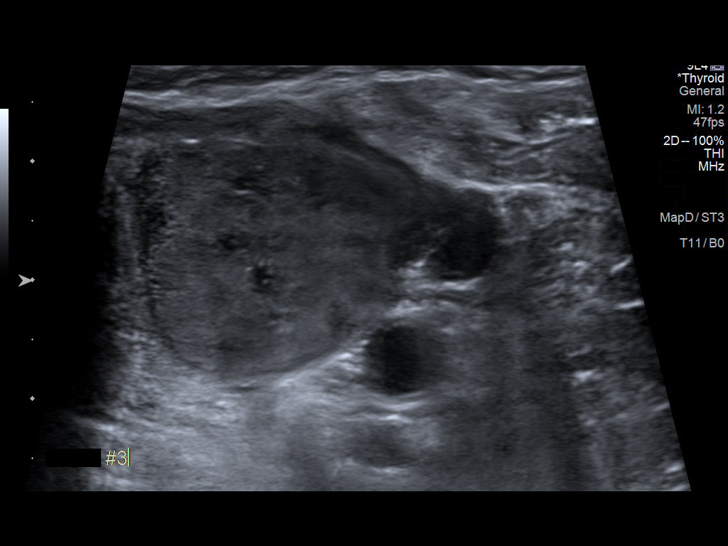
[im 9/13]
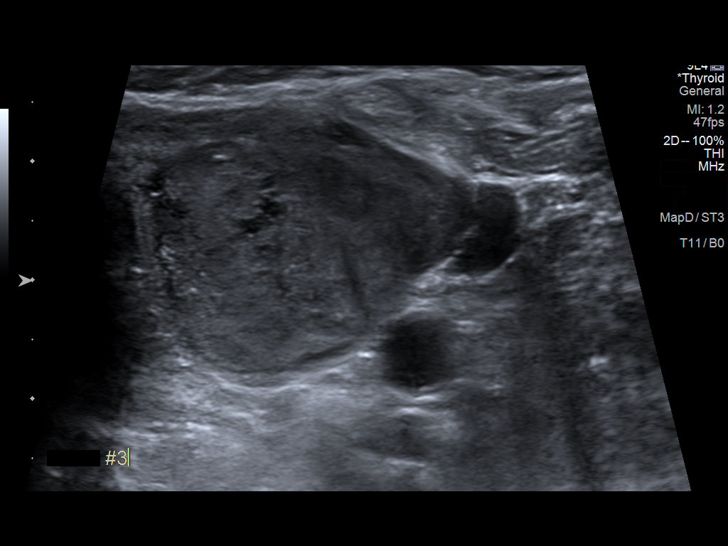
[im 10/13]
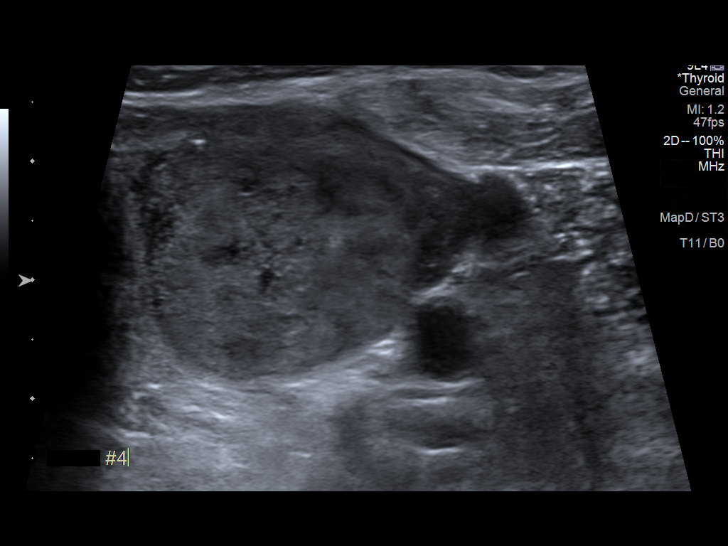
[im 11/13]
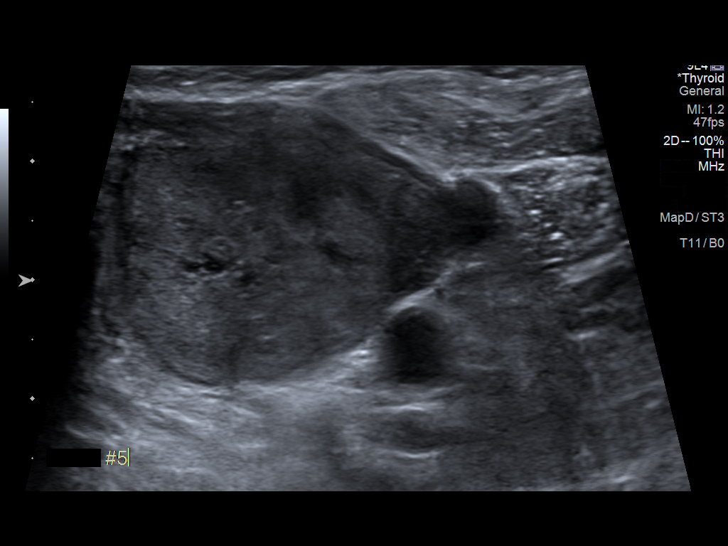
[im 12/13]
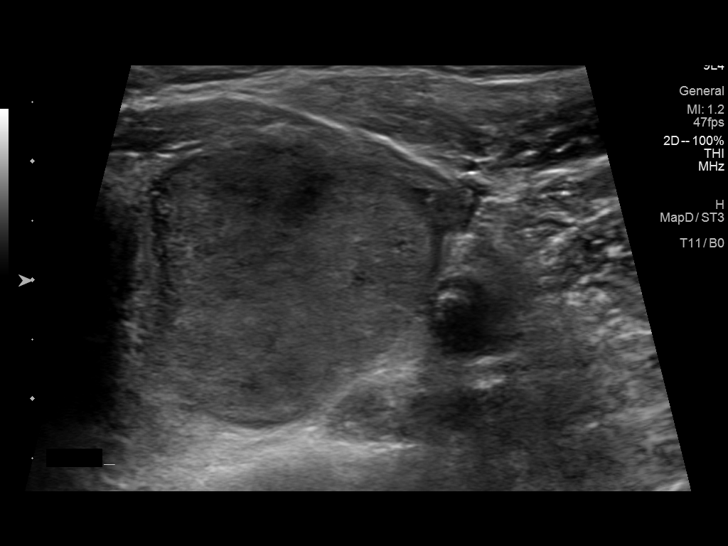
[im 13/13]
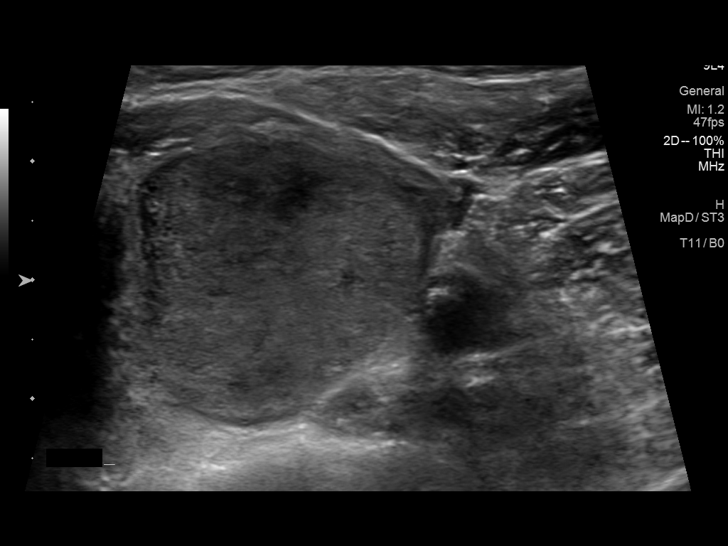

[13 of 13 positions shown; findings below may reference images not displayed]

Pre-procedural ultrasound scanning demonstrated unchanged size and
appearance of the indeterminate nodule within the left thyroid

The procedure was planned. The neck was prepped in the usual sterile
fashion, and a sterile drape was applied covering the operative
field. A timeout was performed prior to the initiation of the
procedure. Local anesthesia was provided with 1% lidocaine.

Under direct ultrasound guidance, 5 FNA biopsies were performed of
the left mid lobe thyroid with a 27 gauge needle.

2 of these samples were obtained for AFIRMA

Multiple ultrasound images were saved for procedural documentation
purposes. The samples were prepared and submitted to pathology.

Limited post procedural scanning was negative for hematoma or
additional complication. Dressings were placed. The patient
tolerated the above procedures procedure well without immediate
postprocedural complication.
FINDINGS: Nodule reference number based on prior diagnostic ultrasound: 2

Maximum size: 3.3 cm

Location: Left; Mid

ACR TI-RADS risk category: TR3 (3 points)

Reason for biopsy: meets ACR TI-RADS criteria

Ultrasound imaging confirms appropriate placement of the needles
within the thyroid nodule.
IMPRESSION: Technically successful ultrasound guided fine needle aspiration of
left mid lobe thyroid nodule

Read by

Edelmo Av

## 2023-01-31 ENCOUNTER — Encounter: Payer: Self-pay | Admitting: Internal Medicine

## 2023-01-31 ENCOUNTER — Ambulatory Visit (INDEPENDENT_AMBULATORY_CARE_PROVIDER_SITE_OTHER): Payer: Medicare Other | Admitting: Internal Medicine

## 2023-01-31 VITALS — BP 110/70 | HR 66 | Temp 97.3°F | Ht 60.25 in | Wt 114.0 lb

## 2023-01-31 DIAGNOSIS — K219 Gastro-esophageal reflux disease without esophagitis: Secondary | ICD-10-CM | POA: Diagnosis not present

## 2023-01-31 DIAGNOSIS — G8191 Hemiplegia, unspecified affecting right dominant side: Secondary | ICD-10-CM

## 2023-01-31 DIAGNOSIS — M546 Pain in thoracic spine: Secondary | ICD-10-CM | POA: Diagnosis not present

## 2023-01-31 DIAGNOSIS — Z0000 Encounter for general adult medical examination without abnormal findings: Secondary | ICD-10-CM

## 2023-01-31 DIAGNOSIS — G479 Sleep disorder, unspecified: Secondary | ICD-10-CM | POA: Diagnosis not present

## 2023-01-31 DIAGNOSIS — I679 Cerebrovascular disease, unspecified: Secondary | ICD-10-CM | POA: Diagnosis not present

## 2023-01-31 DIAGNOSIS — I10 Essential (primary) hypertension: Secondary | ICD-10-CM

## 2023-01-31 LAB — COMPREHENSIVE METABOLIC PANEL
ALT: 23 U/L (ref 0–35)
AST: 22 U/L (ref 0–37)
Albumin: 4.4 g/dL (ref 3.5–5.2)
Alkaline Phosphatase: 52 U/L (ref 39–117)
BUN: 14 mg/dL (ref 6–23)
CO2: 27 mEq/L (ref 19–32)
Calcium: 9.6 mg/dL (ref 8.4–10.5)
Chloride: 102 mEq/L (ref 96–112)
Creatinine, Ser: 0.9 mg/dL (ref 0.40–1.20)
GFR: 59.58 mL/min — ABNORMAL LOW (ref >60.00)
Glucose, Bld: 113 mg/dL — ABNORMAL HIGH (ref 70–99)
Potassium: 4 mEq/L (ref 3.5–5.1)
Sodium: 139 mEq/L (ref 135–145)
Total Bilirubin: 0.8 mg/dL (ref 0.2–1.2)
Total Protein: 7 g/dL (ref 6.0–8.3)

## 2023-01-31 LAB — CBC
HCT: 42.8 % (ref 36.0–46.0)
Hemoglobin: 14.2 g/dL (ref 12.0–15.0)
MCHC: 33.1 g/dL (ref 30.0–36.0)
MCV: 89.8 fl (ref 78.0–100.0)
Platelets: 278 10*3/uL (ref 150.0–400.0)
RBC: 4.76 Mil/uL (ref 3.87–5.11)
RDW: 12.6 % (ref 11.5–15.5)
WBC: 9.4 10*3/uL (ref 4.0–10.5)

## 2023-01-31 LAB — LIPID PANEL
Cholesterol: 101 mg/dL (ref 0–200)
HDL: 39 mg/dL — ABNORMAL LOW (ref >39.00)
LDL Cholesterol: 45 mg/dL (ref 0–99)
NonHDL: 62.11
Total CHOL/HDL Ratio: 3
Triglycerides: 84 mg/dL (ref 0.0–149.0)
VLDL: 16.8 mg/dL (ref 0.0–40.0)

## 2023-01-31 NOTE — Progress Notes (Signed)
Hearing Screening - Comments:: Aware of possible hearing loss.  Vision Screening - Comments:: May 2024

## 2023-01-31 NOTE — Assessment & Plan Note (Signed)
Discussed trying sleep contraction If that is unsuccessful--we can try short course of mirtazapine 7.5mg  nightly (which she tolerated well in the past)

## 2023-01-31 NOTE — Assessment & Plan Note (Signed)
Quiet on the omeprazole daily

## 2023-01-31 NOTE — Assessment & Plan Note (Signed)
Transient right foot/leg weakness in the morning--same as stroke deficit Usually short term Is on ASA 81 and atorvastatin 20mg  daily BP fine

## 2023-01-31 NOTE — Assessment & Plan Note (Signed)
And other scattered joint pains Uses tylenol prn

## 2023-01-31 NOTE — Progress Notes (Signed)
Subjective:    Patient ID: Angela Bowen, female    DOB: 29-Mar-1941, 82 y.o.   MRN: [id number]  HPI Here with husband for Medicare wellness visit and follow up of chronic health conditions Reviewed advanced directives Reviewed other doctors---[facility], needs new dentist, new dermatologist Did have benign thyroid biopsy---no other hospitalizations or surgery Does exercise---but slacking off in the heat--discussed this Rare alcohol No tobacco Viison is okay--is on AREDs vitamins now Hearing is not great--disussed audiology evaluation No falls No depression or anhedonia Independent with instrumental ADLs No sig memory problems--just recall  Does have some back pain with standing--like in the kitchen Better if she walks around and moves Uses tylenol as needed--fairly rare  Some trouble sleeping --initiating mostly Gets up 6-7AM Tries to initiate at 10---TV or reading. Can take hours Melatonin didn't help Mirtazapine did help sleep---and she did gain her weight back  No new neurologic symptoms Known carotid stenosis--but no action being considered  No chest pain or SOB No dizziness or syncope No palpitations No edema No headaches  Current Outpatient Medications on File Prior to Visit  Medication Sig Dispense Refill   aspirin EC 81 MG EC tablet Take 1 tablet (81 mg total) by mouth daily. Swallow whole. 30 tablet 0   atorvastatin (LIPITOR) 20 MG tablet TAKE 2 TABLETS AT BEDTIME 180 tablet 3   benazepril-hydrochlorthiazide (LOTENSIN HCT) 10-12.5 MG tablet TAKE 1 TABLET DAILY 90 tablet 3   Multiple Vitamin (MULTIVITAMIN) tablet Take 1 tablet by mouth daily.     omeprazole (PRILOSEC) 20 MG capsule Take 1 capsule (20 mg total) by mouth daily. 90 capsule 3   [DISCONTINUED] Calcium-Magnesium (CALCIUM MAGNESIUM 750) 300-300 MG TABS Take by mouth daily.      No current facility-administered medications on file prior to visit.    Allergies  Allergen Reactions   Aspirin      High doses - intolerant/causes hives only at high doses, tolerates 81 mg daily well   Codeine     REACTION: rash    Past Medical History:  Diagnosis Date   Cancer (HCC)    stage II melanoma; right leg;face   Diverticulosis of colon    Gallstones    GERD (gastroesophageal reflux disease)    History of skin cancer    Hyperlipidemia    Hypertension    Normal stress echocardiogram 01/2006   echo 5/98, fairly normal   Osteoarthritis    Osteoporosis, unspecified    S/P sclerotherapy of varicose veins 07/30/2006   left leg, Dr. Donia Ast   Skin cancer    STAGE II ON RIGHT LEG   Thyroid nodule    [place] I on bippsy 01/27/21    Past Surgical History:  Procedure Laterality Date   CAROTID PTA/STENT INTERVENTION N/A 11/16/2020   Procedure: CAROTID PTA/STENT INTERVENTION;  Surgeon: Annice Needy, MD;  Location: [facility];  Service: Cardiovascular;  Laterality: N/A;   CHOLECYSTECTOMY  2001   FOOT SURGERY     right   MELANOMA EXCISION  7/10   leg   MOHS SURGERY     TEE WITHOUT CARDIOVERSION N/A 10/28/2020   Procedure: TRANSESOPHAGEAL ECHOCARDIOGRAM (TEE);  Surgeon: Lamar Blinks, MD;  Location: [facility];  Service: Cardiovascular;  Laterality: N/A;   VARICOSE VEIN SURGERY  1984   bilateral   VARICOSE VEIN SURGERY  2004   right foot    Family History  Problem Relation Age of Onset   Alcohol abuse Mother    Colon cancer  Mother    Osteoporosis Mother    Alcohol abuse Father    Diabetes Father    Lung cancer Brother    Ovarian cancer Maternal Grandmother    Breast cancer Neg Hx     Social History   Socioeconomic History   Marital status: Married    Spouse name: Not on file   Number of children: 2   Years of education: Not on file   Highest education level: Not on file  Occupational History   Occupation: homemaker  Tobacco Use   Smoking status: Never    Passive exposure: Past   Smokeless tobacco: Never  Vaping Use   Vaping status: Never Used  Substance  and Sexual Activity   Alcohol use: Yes    Alcohol/week: 0.0 standard drinks of alcohol    Comment: Wine, rarely   Drug use: Never   Sexual activity: Never  Other Topics Concern   Not on file  Social History Narrative   Has living will   No designated health care POA--requests husband. Alternate would be son Madelynn Done   Would accept resuscitation attempts   No tube feeds if cognitively unaware   Social Determinants of Health   Financial Resource Strain: Not on file  Food Insecurity: Not on file  Transportation Needs: Not on file  Physical Activity: Not on file  Stress: Not on file  Social Connections: Not on file  Intimate Partner Violence: Not on file   Review of Systems Appetite is fine Weight back up again Wears seat belt Some trouble with gums--needs new dentist No suspicious skin lesions---starting with new dermatologist next week No heartburn or dysphagia Bowels move fine--no blood Some urinary frequency--but no incontinence Arthritis in right foot--still drags it in the morning (due to stroke---but resolves fairly quickly--her dominant side)    Objective:   Physical Exam Constitutional:      Appearance: Normal appearance.  HENT:     Mouth/Throat:     Pharynx: No oropharyngeal exudate or posterior oropharyngeal erythema.  Eyes:     Conjunctiva/sclera: Conjunctivae normal.     Pupils: Pupils are equal, round, and reactive to light.  Cardiovascular:     Rate and Rhythm: Normal rate and regular rhythm.     Pulses: Normal pulses.     Heart sounds: No murmur heard.    No gallop.  Pulmonary:     Effort: Pulmonary effort is normal.     Breath sounds: Normal breath sounds. No wheezing or rales.  Abdominal:     Palpations: Abdomen is soft.     Tenderness: There is no abdominal tenderness.  Musculoskeletal:     Cervical back: Neck supple.     Right lower leg: No edema.     Left lower leg: No edema.  Lymphadenopathy:     Cervical: No cervical adenopathy.   Skin:    Findings: No rash.  Neurological:     General: No focal deficit present.     Mental Status: She is alert and oriented to person, place, and time.     Comments: Word naming 14/1 minute Recall 3/3 No focal weakness now  Psychiatric:        Mood and Affect: Mood normal.        Behavior: Behavior normal.            Assessment & Plan:

## 2023-01-31 NOTE — Assessment & Plan Note (Signed)
BP Readings from Last 3 Encounters:  01/31/23 110/70  06/24/22 130/88  01/26/22 118/70   Good control on the benazepril/hydrochlorothiazide 10/12.5 Will check labs

## 2023-01-31 NOTE — Assessment & Plan Note (Signed)
I have personally reviewed the Medicare Annual Wellness questionnaire and have noted 1. The patient's medical and social history 2. Their use of alcohol, tobacco or illicit drugs 3. Their current medications and supplements 4. The patient's functional ability including ADL's, fall risks, home safety risks and hearing or visual             impairment. 5. Diet and physical activities 6. Evidence for depression or mood disorders  The patients weight, height, BMI and visual acuity have been recorded in the chart I have made referrals, counseling and provided education to the patient based review of the above and I have provided the pt with a written personalized care plan for preventive services.  I have provided you with a copy of your personalized plan for preventive services. Please take the time to review along with your updated medication list.  Done with cancer screening Had RSV Update COVID/flu vaccines in the fall Had RSV Discussed increasing exercise--especially resistance

## 2023-02-07 DIAGNOSIS — Z08 Encounter for follow-up examination after completed treatment for malignant neoplasm: Secondary | ICD-10-CM | POA: Diagnosis not present

## 2023-02-07 DIAGNOSIS — Z85820 Personal history of malignant melanoma of skin: Secondary | ICD-10-CM | POA: Diagnosis not present

## 2023-02-07 DIAGNOSIS — D225 Melanocytic nevi of trunk: Secondary | ICD-10-CM | POA: Diagnosis not present

## 2023-02-07 DIAGNOSIS — Z1283 Encounter for screening for malignant neoplasm of skin: Secondary | ICD-10-CM | POA: Diagnosis not present

## 2023-02-07 DIAGNOSIS — L570 Actinic keratosis: Secondary | ICD-10-CM | POA: Diagnosis not present

## 2023-02-07 DIAGNOSIS — X32XXXA Exposure to sunlight, initial encounter: Secondary | ICD-10-CM | POA: Diagnosis not present

## 2023-02-13 ENCOUNTER — Other Ambulatory Visit: Payer: Self-pay | Admitting: Internal Medicine

## 2023-03-16 ENCOUNTER — Other Ambulatory Visit: Payer: Self-pay | Admitting: Internal Medicine

## 2023-04-17 ENCOUNTER — Other Ambulatory Visit: Payer: Self-pay | Admitting: Internal Medicine

## 2023-08-05 DIAGNOSIS — D225 Melanocytic nevi of trunk: Secondary | ICD-10-CM | POA: Diagnosis not present

## 2023-08-05 DIAGNOSIS — L821 Other seborrheic keratosis: Secondary | ICD-10-CM | POA: Diagnosis not present

## 2023-08-05 DIAGNOSIS — D2239 Melanocytic nevi of other parts of face: Secondary | ICD-10-CM | POA: Diagnosis not present

## 2023-08-05 DIAGNOSIS — D485 Neoplasm of uncertain behavior of skin: Secondary | ICD-10-CM | POA: Diagnosis not present

## 2023-08-29 DIAGNOSIS — C44311 Basal cell carcinoma of skin of nose: Secondary | ICD-10-CM | POA: Diagnosis not present

## 2023-09-26 DIAGNOSIS — H903 Sensorineural hearing loss, bilateral: Secondary | ICD-10-CM | POA: Diagnosis not present

## 2023-10-05 DIAGNOSIS — Z961 Presence of intraocular lens: Secondary | ICD-10-CM | POA: Diagnosis not present

## 2023-10-05 DIAGNOSIS — H524 Presbyopia: Secondary | ICD-10-CM | POA: Diagnosis not present

## 2023-10-05 DIAGNOSIS — H353131 Nonexudative age-related macular degeneration, bilateral, early dry stage: Secondary | ICD-10-CM | POA: Diagnosis not present

## 2023-10-09 DIAGNOSIS — H353221 Exudative age-related macular degeneration, left eye, with active choroidal neovascularization: Secondary | ICD-10-CM | POA: Diagnosis not present

## 2023-10-09 DIAGNOSIS — H353211 Exudative age-related macular degeneration, right eye, with active choroidal neovascularization: Secondary | ICD-10-CM | POA: Diagnosis not present

## 2023-11-13 DIAGNOSIS — H353221 Exudative age-related macular degeneration, left eye, with active choroidal neovascularization: Secondary | ICD-10-CM | POA: Diagnosis not present

## 2023-11-13 DIAGNOSIS — H353211 Exudative age-related macular degeneration, right eye, with active choroidal neovascularization: Secondary | ICD-10-CM | POA: Diagnosis not present

## 2023-12-11 ENCOUNTER — Telehealth: Payer: Self-pay | Admitting: Internal Medicine

## 2023-12-11 MED ORDER — BENAZEPRIL-HYDROCHLOROTHIAZIDE 10-12.5 MG PO TABS: 1.0000 | ORAL_TABLET | Freq: Every day | ORAL | 0 refills | Status: DC

## 2023-12-11 NOTE — Telephone Encounter (Signed)
 Copied from CRM [id number]. Topic: Clinical - Medication Refill >> Dec 11, 2023  8:49 AM Fonda T wrote: Medication:  benazepril -hydrochlorthiazide (LOTENSIN  HCT) 10-12.5 MG tablet  Has the patient contacted their pharmacy? No (Agent: If no, request that the patient contact the pharmacy for the refill. If patient does not wish to contact the pharmacy document the reason why and proceed with request.) (Agent: If yes, when and what did the pharmacy advise?)  This is the patient's preferred pharmacy:  [facility] - [place], [place] - [address] [place] [address] Phone: [phone] Fax: [phone]   Is this the correct pharmacy for this prescription? Yes If no, delete pharmacy and type the correct one.   Has the prescription been filled recently? Yes  Is the patient out of the medication? No, patient states she has five days left  Has the patient been seen for an appointment in the last year OR does the patient have an upcoming appointment? Yes  Can we respond through MyChart? No  Agent: Please be advised that Rx refills may take up to 3 business days. We ask that you follow-up with your pharmacy.

## 2023-12-18 DIAGNOSIS — H353211 Exudative age-related macular degeneration, right eye, with active choroidal neovascularization: Secondary | ICD-10-CM | POA: Diagnosis not present

## 2023-12-18 DIAGNOSIS — H353221 Exudative age-related macular degeneration, left eye, with active choroidal neovascularization: Secondary | ICD-10-CM | POA: Diagnosis not present

## 2023-12-19 ENCOUNTER — Telehealth: Payer: Self-pay

## 2023-12-19 NOTE — Telephone Encounter (Signed)
 Thank you. She was returning my call about her husband. I will copy this message to his open message.

## 2023-12-19 NOTE — Telephone Encounter (Signed)
 Copied from CRM [id number]. Topic: General - Call Back - No Documentation >> Dec 18, 2023  5:20 PM Abigail D wrote: Reason for CRM: Patient returning call from [facility], advised patient to expect call back in the morning.

## 2024-01-31 ENCOUNTER — Encounter: Payer: Self-pay | Admitting: Internal Medicine

## 2024-01-31 ENCOUNTER — Ambulatory Visit (INDEPENDENT_AMBULATORY_CARE_PROVIDER_SITE_OTHER): Payer: Medicare Other | Admitting: Internal Medicine

## 2024-01-31 VITALS — BP 100/60 | HR 70 | Ht 60.0 in | Wt 101.0 lb

## 2024-01-31 DIAGNOSIS — I679 Cerebrovascular disease, unspecified: Secondary | ICD-10-CM

## 2024-01-31 DIAGNOSIS — Z0000 Encounter for general adult medical examination without abnormal findings: Secondary | ICD-10-CM

## 2024-01-31 DIAGNOSIS — K219 Gastro-esophageal reflux disease without esophagitis: Secondary | ICD-10-CM | POA: Diagnosis not present

## 2024-01-31 DIAGNOSIS — F39 Unspecified mood [affective] disorder: Secondary | ICD-10-CM

## 2024-01-31 DIAGNOSIS — G8191 Hemiplegia, unspecified affecting right dominant side: Secondary | ICD-10-CM | POA: Diagnosis not present

## 2024-01-31 DIAGNOSIS — M21371 Foot drop, right foot: Secondary | ICD-10-CM | POA: Diagnosis not present

## 2024-01-31 DIAGNOSIS — H353230 Exudative age-related macular degeneration, bilateral, stage unspecified: Secondary | ICD-10-CM | POA: Diagnosis not present

## 2024-01-31 DIAGNOSIS — I10 Essential (primary) hypertension: Secondary | ICD-10-CM | POA: Diagnosis not present

## 2024-01-31 DIAGNOSIS — H353290 Exudative age-related macular degeneration, unspecified eye, stage unspecified: Secondary | ICD-10-CM | POA: Insufficient documentation

## 2024-01-31 LAB — COMPREHENSIVE METABOLIC PANEL WITH GFR
ALT: 23 U/L (ref 0–35)
AST: 19 U/L (ref 0–37)
Albumin: 4.5 g/dL (ref 3.5–5.2)
Alkaline Phosphatase: 69 U/L (ref 39–117)
BUN: 15 mg/dL (ref 6–23)
CO2: 30 meq/L (ref 19–32)
Calcium: 9.7 mg/dL (ref 8.4–10.5)
Chloride: 101 meq/L (ref 96–112)
Creatinine, Ser: 0.85 mg/dL (ref 0.40–1.20)
GFR: 63.37 mL/min (ref >60.00)
Glucose, Bld: 102 mg/dL — ABNORMAL HIGH (ref 70–99)
Potassium: 4.2 meq/L (ref 3.5–5.1)
Sodium: 140 meq/L (ref 135–145)
Total Bilirubin: 0.7 mg/dL (ref 0.2–1.2)
Total Protein: 6.8 g/dL (ref 6.0–8.3)

## 2024-01-31 LAB — LIPID PANEL
Cholesterol: 105 mg/dL (ref 0–200)
HDL: 49.2 mg/dL (ref >39.00)
LDL Cholesterol: 45 mg/dL (ref 0–99)
NonHDL: 56.23
Total CHOL/HDL Ratio: 2
Triglycerides: 55 mg/dL (ref 0.0–149.0)
VLDL: 11 mg/dL (ref 0.0–40.0)

## 2024-01-31 LAB — CBC
HCT: 42.7 % (ref 36.0–46.0)
Hemoglobin: 14.1 g/dL (ref 12.0–15.0)
MCHC: 33 g/dL (ref 30.0–36.0)
MCV: 90.5 fl (ref 78.0–100.0)
Platelets: 267 K/uL (ref 150.0–400.0)
RBC: 4.71 Mil/uL (ref 3.87–5.11)
RDW: 12.7 % (ref 11.5–15.5)
WBC: 8.5 K/uL (ref 4.0–10.5)

## 2024-01-31 LAB — VITAMIN B12: Vitamin B-12: 290 pg/mL (ref 211–911)

## 2024-01-31 LAB — TSH: TSH: 3.23 u[IU]/mL (ref 0.35–5.50)

## 2024-01-31 MED ORDER — MIRTAZAPINE 7.5 MG PO TABS: 7.5000 mg | ORAL_TABLET | Freq: Every day | ORAL | 1 refills | Status: AC

## 2024-01-31 MED ORDER — MIRTAZAPINE 7.5 MG PO TABS: 7.5000 mg | ORAL_TABLET | Freq: Every day | ORAL | 3 refills | Status: DC

## 2024-01-31 NOTE — Assessment & Plan Note (Signed)
 I have personally reviewed the Medicare Annual Wellness questionnaire and have noted 1. The patient's medical and social history 2. Their use of alcohol, tobacco or illicit drugs 3. Their current medications and supplements 4. The patient's functional ability including ADL's, fall risks, home safety risks and hearing or visual             impairment. 5. Diet and physical activities 6. Evidence for depression or mood disorders  The patients weight, height, BMI and visual acuity have been recorded in the chart I have made referrals, counseling and provided education to the patient based review of the above and I have provided the pt with a written personalized care plan for preventive services.  I have provided you with a copy of your personalized plan for preventive services. Please take the time to review along with your updated medication list.  Done with cancer screening Flu/COVID vaccines in the fall Hopes to start exercise

## 2024-01-31 NOTE — Assessment & Plan Note (Signed)
 Reactive depression with care giving stress Sleep disorder and weight loss Will restart mirtazapine  that she did well with in the past--7.5mg 
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
## 2024-01-31 NOTE — Progress Notes (Signed)
 Hearing Screening - Comments:: June 2025. Needs hearing aids.  Vision Screening - Comments:: June 2025

## 2024-01-31 NOTE — Assessment & Plan Note (Signed)
Getting injections in both eyes

## 2024-01-31 NOTE — Assessment & Plan Note (Signed)
 Probably needs AFO Will set up with ortho

## 2024-01-31 NOTE — Assessment & Plan Note (Signed)
Controlled with omeprazole 

## 2024-01-31 NOTE — Assessment & Plan Note (Signed)
 Ongoing right foot issues On ASA and atorvastatin  20

## 2024-01-31 NOTE — Assessment & Plan Note (Signed)
 BP Readings from Last 3 Encounters:  01/31/24 100/60  01/31/23 110/70  06/24/22 130/88   Controlled with benazepril /hydrochlorothiazide  10/12.5

## 2024-02-01 ENCOUNTER — Ambulatory Visit: Payer: Self-pay | Admitting: Internal Medicine

## 2024-02-05 DIAGNOSIS — H353211 Exudative age-related macular degeneration, right eye, with active choroidal neovascularization: Secondary | ICD-10-CM | POA: Diagnosis not present

## 2024-02-05 DIAGNOSIS — H353221 Exudative age-related macular degeneration, left eye, with active choroidal neovascularization: Secondary | ICD-10-CM | POA: Diagnosis not present

## 2024-02-14 DIAGNOSIS — D485 Neoplasm of uncertain behavior of skin: Secondary | ICD-10-CM | POA: Diagnosis not present

## 2024-02-14 DIAGNOSIS — C44311 Basal cell carcinoma of skin of nose: Secondary | ICD-10-CM | POA: Diagnosis not present

## 2024-02-16 ENCOUNTER — Encounter: Payer: Self-pay | Admitting: Orthopedic Surgery

## 2024-02-16 ENCOUNTER — Other Ambulatory Visit (INDEPENDENT_AMBULATORY_CARE_PROVIDER_SITE_OTHER): Payer: Self-pay

## 2024-02-16 ENCOUNTER — Ambulatory Visit: Admitting: Orthopedic Surgery

## 2024-02-16 DIAGNOSIS — M25571 Pain in right ankle and joints of right foot: Secondary | ICD-10-CM | POA: Diagnosis not present

## 2024-02-16 DIAGNOSIS — M545 Low back pain, unspecified: Secondary | ICD-10-CM

## 2024-02-16 NOTE — Progress Notes (Signed)
 Office Visit Note   Patient: Angela Bowen           Date of Birth: [birth year]           MRN: [id number] Visit Date: 02/16/2024 Requested by: Angela Charlie FERNS, MD [address] [place],  [place] [address] PCP: Angela Charlie FERNS, MD  Subjective: Chief Complaint  Patient presents with   Other    Low back pain with right foot/ankle pain    HPI: Angela Bowen is a 83 y.o. female who presents to the office reporting right foot pain.  Also reported right leg pain.  She has tried several different shoes with inserts.  Denies any numbness and tingling.  Does have some lower back pain denies any radicular leg pain.  Has difficulty ambulating at times.  She had a stroke 3 years ago which affected her right side.  Husband is at the hospital.  He  has had a stroke also...  Reports stiffness in the mornings.  He has also tried x-rays today in the mornings.  Takes extra strength Tylenol  which helps.  She has tried several different shoes and inserts.     .   ROS: All systems reviewed are negative as they relate to the chief complaint within the history of present illness.  Patient denies fevers or chills.  Assessment & Plan: Visit Diagnoses:  1. Low back pain, unspecified back pain laterality, unspecified chronicity, unspecified whether sciatica present   2. Pain in right ankle and joints of right foot     Plan: Impression is right foot pain with midfoot arthritis.  She is in pretty reasonable shoes.  She has some decreased subtalar motion on the right compared to the left.  Tibiotalar transverse tarsal range of motion nontender bilateral.  Plan at this time is observation.  She does have good active dorsiflexion and plantarflexion strength.  No indication for intervention at this time.  Follow-up as needed  Follow-Up Instructions: No follow-ups on file.   Orders:  Orders Placed This Encounter  Procedures   XR Lumbar Spine 2-3 Views   XR Foot Complete Right   No orders of the  defined types were placed in this encounter.     Procedures: No procedures performed   Clinical Data: No additional findings.  Objective: Vital Signs: There were no vitals taken for this visit.  Physical Exam:  Constitutional: Patient appears well-developed HEENT:  Head: Normocephalic Eyes:EOM are normal Neck: Normal range of motion Cardiovascular: Normal rate Pulmonary/chest: Effort normal Neurologic: Patient is alert Skin: Skin is warm Psychiatric: Patient has normal mood and affect  Ortho Exam: Ortho exam demonstrates normal gait alignment.  She has symmetric passive ankle range of motion with dorsiflexion and flexion of both sides.  Has not had 3 or 4 range of motion of the subtalar joint on the right that she knows of the left.  She will transverse tarsal ligament is intact.  Pedal pulses trace palpable bilaterally.  Palpable intact nontender anterior to posterior posterior Achilles tendons.  No nerve root tension signs bilaterally.  No muscle atrophy in either leg. Specialty Comments:  No specialty comments available.  Imaging: No results found.   PMFS History: Patient Active Problem List   Diagnosis Date Noted   Macular degeneration, wet (HCC) 01/31/2024   Mood disorder (HCC) 01/31/2024   Right foot drop 01/31/2024   Sleep disturbance 01/31/2023   Thoracic spine pain 12/13/2021   Thyroid  nodule 01/28/2021   Multiple thyroid  nodules 11/04/2020  Symptomatic stenosis of left carotid artery    Hemiplegia of right dominant side due to cerebrovascular disease (HCC) 10/26/2020   Osteoarthritis 07/30/2019   Advance directive discussed with patient 11/14/2014   Osteoporosis    Routine general medical examination at a health care facility 12/31/2010   GERD 10/17/2008   Essential hypertension, benign 02/27/2007   Diverticulosis of colon 02/27/2007   Osteoarthritis, generalized 02/27/2007   SKIN CANCER, HX OF 02/27/2007   Past Medical History:  Diagnosis Date    Cancer (HCC)    stage II melanoma; right leg;face   Diverticulosis of colon    Gallstones    GERD (gastroesophageal reflux disease)    History of skin cancer    Hyperlipidemia    Hypertension    Normal stress echocardiogram 01/2006   echo 5/98, fairly normal   Osteoarthritis    Osteoporosis, unspecified    S/P sclerotherapy of varicose veins 07/30/2006   left leg, Dr. Doreene   Skin cancer    STAGE II ON RIGHT LEG   Thyroid  nodule    [place]da I on bippsy 01/27/21    Family History  Problem Relation Age of Onset   Alcohol abuse Mother    Colon cancer Mother    Osteoporosis Mother    Alcohol abuse Father    Diabetes Father    Lung cancer Brother    Ovarian cancer Maternal Grandmother    Breast cancer Neg Hx     Past Surgical History:  Procedure Laterality Date   CAROTID PTA/STENT INTERVENTION N/A 11/16/2020   Procedure: CAROTID PTA/STENT INTERVENTION;  Surgeon: Angela Selinda RAMAN, MD;  Location: [facility];  Service: Cardiovascular;  Laterality: N/A;   CHOLECYSTECTOMY  2001   FOOT SURGERY     right   MELANOMA EXCISION  7/10   leg   MOHS SURGERY     TEE WITHOUT CARDIOVERSION N/A 10/28/2020   Procedure: TRANSESOPHAGEAL ECHOCARDIOGRAM (TEE);  Surgeon: Angela Wolm PARAS, MD;  Location: [facility];  Service: Cardiovascular;  Laterality: N/A;   VARICOSE VEIN SURGERY  1984   bilateral   VARICOSE VEIN SURGERY  2004   right foot   Social History   Occupational History   Occupation: homemaker  Tobacco Use   Smoking status: Never    Passive exposure: Past   Smokeless tobacco: Never  Vaping Use   Vaping status: Never Used  Substance and Sexual Activity   Alcohol use: Yes    Alcohol/week: 0.0 standard drinks of alcohol    Comment: Wine, rarely   Drug use: Never   Sexual activity: Never

## 2024-03-04 DIAGNOSIS — Z8679 Personal history of other diseases of the circulatory system: Secondary | ICD-10-CM | POA: Diagnosis not present

## 2024-03-04 DIAGNOSIS — C44311 Basal cell carcinoma of skin of nose: Secondary | ICD-10-CM | POA: Diagnosis not present

## 2024-03-08 ENCOUNTER — Telehealth: Payer: Self-pay | Admitting: *Deleted

## 2024-03-08 ENCOUNTER — Other Ambulatory Visit: Payer: Self-pay | Admitting: Internal Medicine

## 2024-03-08 MED ORDER — BENAZEPRIL-HYDROCHLOROTHIAZIDE 10-12.5 MG PO TABS: 1.0000 | ORAL_TABLET | Freq: Every day | ORAL | 3 refills | Status: DC

## 2024-03-08 NOTE — Telephone Encounter (Signed)
 Copied from CRM #[id number]. Topic: Clinical - Medication Refill >> Mar 08, 2024  8:54 AM Ahlexyia S wrote: Medication: benazepril -hydrochlorthiazide (LOTENSIN  HCT) 10-12.5 MG tablet  Has the patient contacted their pharmacy? No (Agent: If no, request that the patient contact the pharmacy for the refill. If patient does not wish to contact the pharmacy document the reason why and proceed with request.) (Agent: If yes, when and what did the pharmacy advise?)  This is the patient's preferred pharmacy:  [facility] - [place], [place] - [address] [place] [address] Phone: [phone] Fax: [phone]  Is this the correct pharmacy for this prescription? Yes If no, delete pharmacy and type the correct one.   Has the prescription been filled recently? No  Is the patient out of the medication? No, limited supply  Has the patient been seen for an appointment in the last year OR does the patient have an upcoming appointment? Yes  Can we respond through MyChart? No  Agent: Please be advised that Rx refills may take up to 3 business days. We ask that you follow-up with your pharmacy.

## 2024-03-08 NOTE — Telephone Encounter (Signed)
 [facility] sent surgical clearance forms on this pt. Pt will need a surgical/cardiac clearance appt with an available provider to clear her, please schedule asap

## 2024-03-12 ENCOUNTER — Telehealth: Payer: Self-pay | Admitting: Internal Medicine

## 2024-03-12 NOTE — Telephone Encounter (Signed)
 Has two  left   Prescription Request  03/12/2024  LOV: 01/31/2024  What is the name of the medication or equipment?  benazepril -hydrochlorthiazide (LOTENSIN  HCT) 10-12.5 MG tabl  Have you contacted your pharmacy to request a refill? Yes   Which pharmacy would you like this sent to?   [id number] - [place], [place] - [address]  [address] Phone: [phone] Fax: [phone]    Patient notified that their request is being sent to the clinical staff for review and that they should receive a response within 2 business days.   Please advise at Mobile [phone] (mobile)

## 2024-03-12 NOTE — Telephone Encounter (Signed)
 Left message to call office to let me know if she is needing a small rx until her mail order arrives or is she not using mail order and needs full rx at [facility]?

## 2024-03-13 NOTE — Telephone Encounter (Signed)
Left message to call office, again. 

## 2024-03-20 DIAGNOSIS — Z7982 Long term (current) use of aspirin: Secondary | ICD-10-CM | POA: Diagnosis not present

## 2024-03-20 DIAGNOSIS — K219 Gastro-esophageal reflux disease without esophagitis: Secondary | ICD-10-CM | POA: Diagnosis not present

## 2024-03-20 DIAGNOSIS — I10 Essential (primary) hypertension: Secondary | ICD-10-CM | POA: Diagnosis not present

## 2024-03-20 DIAGNOSIS — E785 Hyperlipidemia, unspecified: Secondary | ICD-10-CM | POA: Diagnosis not present

## 2024-03-20 DIAGNOSIS — I6529 Occlusion and stenosis of unspecified carotid artery: Secondary | ICD-10-CM | POA: Diagnosis not present

## 2024-03-20 DIAGNOSIS — Z8673 Personal history of transient ischemic attack (TIA), and cerebral infarction without residual deficits: Secondary | ICD-10-CM | POA: Diagnosis not present

## 2024-03-22 DIAGNOSIS — H353211 Exudative age-related macular degeneration, right eye, with active choroidal neovascularization: Secondary | ICD-10-CM | POA: Diagnosis not present

## 2024-03-22 DIAGNOSIS — H353221 Exudative age-related macular degeneration, left eye, with active choroidal neovascularization: Secondary | ICD-10-CM | POA: Diagnosis not present

## 2024-03-22 NOTE — Telephone Encounter (Signed)
 Left message on VM asking her to call back if she is still needing the rx to [facility] as she should have received the mail order rx at this point.

## 2024-03-28 DIAGNOSIS — Z7982 Long term (current) use of aspirin: Secondary | ICD-10-CM | POA: Diagnosis not present

## 2024-03-28 DIAGNOSIS — Z85828 Personal history of other malignant neoplasm of skin: Secondary | ICD-10-CM | POA: Diagnosis not present

## 2024-03-28 DIAGNOSIS — Z79899 Other long term (current) drug therapy: Secondary | ICD-10-CM | POA: Diagnosis not present

## 2024-03-28 DIAGNOSIS — Z886 Allergy status to analgesic agent status: Secondary | ICD-10-CM | POA: Diagnosis not present

## 2024-03-28 DIAGNOSIS — C44311 Basal cell carcinoma of skin of nose: Secondary | ICD-10-CM | POA: Diagnosis not present

## 2024-03-28 DIAGNOSIS — Z483 Aftercare following surgery for neoplasm: Secondary | ICD-10-CM | POA: Diagnosis not present

## 2024-03-28 DIAGNOSIS — Z85820 Personal history of malignant melanoma of skin: Secondary | ICD-10-CM | POA: Diagnosis not present

## 2024-03-28 DIAGNOSIS — I10 Essential (primary) hypertension: Secondary | ICD-10-CM | POA: Diagnosis not present

## 2024-03-28 DIAGNOSIS — Z428 Encounter for other plastic and reconstructive surgery following medical procedure or healed injury: Secondary | ICD-10-CM | POA: Diagnosis not present

## 2024-03-28 DIAGNOSIS — Z885 Allergy status to narcotic agent status: Secondary | ICD-10-CM | POA: Diagnosis not present

## 2024-03-28 DIAGNOSIS — Z8673 Personal history of transient ischemic attack (TIA), and cerebral infarction without residual deficits: Secondary | ICD-10-CM | POA: Diagnosis not present

## 2024-04-05 DIAGNOSIS — M950 Acquired deformity of nose: Secondary | ICD-10-CM | POA: Diagnosis not present

## 2024-04-05 DIAGNOSIS — Z85828 Personal history of other malignant neoplasm of skin: Secondary | ICD-10-CM | POA: Diagnosis not present

## 2024-04-05 DIAGNOSIS — I10 Essential (primary) hypertension: Secondary | ICD-10-CM | POA: Diagnosis not present

## 2024-04-12 ENCOUNTER — Other Ambulatory Visit: Payer: Self-pay

## 2024-04-12 MED ORDER — ATORVASTATIN CALCIUM 20 MG PO TABS: 40.0000 mg | ORAL_TABLET | Freq: Every day | ORAL | 0 refills | Status: DC

## 2024-04-12 NOTE — Telephone Encounter (Signed)
 Rx sent electronically.

## 2024-04-12 NOTE — Telephone Encounter (Signed)
 Copied from CRM #[id number]. Topic: Clinical - Medication Refill >> Apr 12, 2024 12:49 PM Mia F wrote: Medication: atorvastatin  (LIPITOR) 20 MG tablet   Has the patient contacted their pharmacy? Yes (Agent: If no, request that the patient contact the pharmacy for the refill. If patient does not wish to contact the pharmacy document the reason why and proceed with request.) (Agent: If yes, when and what did the pharmacy advise?)  This is the patient's preferred pharmacy:  [facility] - [place], [place] - [address] [place] [address] Phone: [phone] Fax: [phone]  [id number] - [place], [place] - [address] [facility] [place] [address] Phone: [phone] Fax: [phone]  Is this the correct pharmacy for this prescription? Yes If no, delete pharmacy and type the correct one.   Has the prescription been filled recently? No  Is the patient out of the medication? No  Has the patient been seen for an appointment in the last year OR does the patient have an upcoming appointment? No  Can we respond through MyChart? No  Agent: Please be advised that Rx refills may take up to 3 business days. We ask that you follow-up with your pharmacy.

## 2024-04-24 DIAGNOSIS — H353221 Exudative age-related macular degeneration, left eye, with active choroidal neovascularization: Secondary | ICD-10-CM | POA: Diagnosis not present

## 2024-04-29 ENCOUNTER — Encounter: Payer: Self-pay | Admitting: Radiology

## 2024-05-01 ENCOUNTER — Encounter

## 2024-05-10 DIAGNOSIS — H353211 Exudative age-related macular degeneration, right eye, with active choroidal neovascularization: Secondary | ICD-10-CM | POA: Diagnosis not present

## 2024-05-14 DIAGNOSIS — C44311 Basal cell carcinoma of skin of nose: Secondary | ICD-10-CM | POA: Diagnosis not present

## 2024-06-03 DIAGNOSIS — H353221 Exudative age-related macular degeneration, left eye, with active choroidal neovascularization: Secondary | ICD-10-CM | POA: Diagnosis not present

## 2024-06-13 ENCOUNTER — Encounter

## 2024-06-19 ENCOUNTER — Other Ambulatory Visit: Payer: Self-pay

## 2024-06-19 MED ORDER — BENAZEPRIL-HYDROCHLOROTHIAZIDE 10-12.5 MG PO TABS: 1.0000 | ORAL_TABLET | Freq: Every day | ORAL | 0 refills | Status: AC

## 2024-06-19 NOTE — Telephone Encounter (Signed)
 Copied from CRM [id number]. Topic: Clinical - Medication Refill >> Jun 19, 2024 10:04 AM Franky GRADE wrote: Medication: benazepril -hydrochlorthiazide (LOTENSIN  HCT) 10-12.5 MG tablet [[id number]]  Has the patient contacted their pharmacy? Yes, they asked patient to call the office as none of their request have been answered.  (Agent: If no, request that the patient contact the pharmacy for the refill. If patient does not wish to contact the pharmacy document the reason why and proceed with request.) (Agent: If yes, when and what did the pharmacy advise?)  This is the patient's preferred pharmacy:  [facility] - [place], [place] - [address] [place] [address] Phone: [phone] Fax: [phone]    Is this the correct pharmacy for this prescription? Yes If no, delete pharmacy and type the correct one.   Has the prescription been filled recently? No  Is the patient out of the medication? Yes  Has the patient been seen for an appointment in the last year OR does the patient have an upcoming appointment? Yes  Can we respond through MyChart? Yes  Agent: Please be advised that Rx refills may take up to 3 business days. We ask that you follow-up with your pharmacy.

## 2024-07-01 ENCOUNTER — Other Ambulatory Visit: Payer: Self-pay

## 2024-07-01 ENCOUNTER — Encounter

## 2024-07-01 NOTE — Telephone Encounter (Signed)
 Copied from CRM [id number]. Topic: Clinical - Medication Refill >> Jul 01, 2024 10:42 AM Charolett L wrote: Medication: omeprazole  (PRILOSEC) 20 MG capsule  Has the patient contacted their pharmacy? Yes (Agent: If no, request that the patient contact the pharmacy for the refill. If patient does not wish to contact the pharmacy document the reason why and proceed with request.) (Agent: If yes, when and what did the pharmacy advise?)  This is the patient's preferred pharmacy:  [facility] - [place], [place] - [address] [place] [address] Phone: [phone] Fax: [phone]  Is this the correct pharmacy for this prescription? Yes If no, delete pharmacy and type the correct one.   Has the prescription been filled recently? Yes  Is the patient out of the medication? Yes  Has the patient been seen for an appointment in the last year OR does the patient have an upcoming appointment? Yes  Can we respond through MyChart? Yes  Agent: Please be advised that Rx refills may take up to 3 business days. We ask that you follow-up with your pharmacy.

## 2024-07-02 MED ORDER — OMEPRAZOLE 20 MG PO CPDR: 20.0000 mg | DELAYED_RELEASE_CAPSULE | Freq: Every day | ORAL | 3 refills | Status: AC

## 2024-07-16 ENCOUNTER — Encounter

## 2024-07-26 ENCOUNTER — Other Ambulatory Visit: Payer: Self-pay

## 2024-07-26 MED ORDER — ATORVASTATIN CALCIUM 20 MG PO TABS: 40.0000 mg | ORAL_TABLET | Freq: Every day | ORAL | 0 refills | Status: AC

## 2024-07-26 MED ORDER — MIRTAZAPINE 7.5 MG PO TABS: 7.5000 mg | ORAL_TABLET | Freq: Every day | ORAL | 0 refills | Status: AC

## 2024-07-26 NOTE — Telephone Encounter (Signed)
 Atorvastatin  last rx:  04/12/24, #180 Mirtazapine  last rx:  01/31/24, #90 Last OV:  01/31/24, annual exam (w/Dr Jimmy) Next OV:  09/10/24, TOC to Dr Bennett

## 2024-07-26 NOTE — Telephone Encounter (Signed)
 Copied from CRM #[id number]. Topic: Clinical - Medication Refill >> Jul 26, 2024 10:10 AM Shereese L wrote: Medication: atorvastatin  (LIPITOR) 20 MG tablet mirtazapine  (REMERON ) 7.5 MG tablet  Has the patient contacted their pharmacy? Yes (Agent: If no, request that the patient contact the pharmacy for the refill. If patient does not wish to contact the pharmacy document the reason why and proceed with request.) (Agent: If yes, when and what did the pharmacy advise?)  This is the patient's preferred pharmacy:  [facility] - [place], [place] - [address] [place] [address] Phone: [phone] Fax: [phone]  Is this the correct pharmacy for this prescription? Yes If no, delete pharmacy and type the correct one.   Has the prescription been filled recently? Yes  Is the patient out of the medication? Yes  Has the patient been seen for an appointment in the last year OR does the patient have an upcoming appointment? Yes  Can we respond through MyChart? Yes  Agent: Please be advised that Rx refills may take up to 3 business days. We ask that you follow-up with your pharmacy.

## 2024-09-10 ENCOUNTER — Encounter
